# Patient Record
Sex: Female | Born: 1950 | Race: Black or African American | Hispanic: No | Marital: Married | Smoking: Never smoker
Health system: Southern US, Community
[De-identification: ages and names within clinical notes are randomized; demographics above are authoritative.]

## PROBLEM LIST (undated history)

## (undated) DIAGNOSIS — K602 Anal fissure, unspecified: Secondary | ICD-10-CM

## (undated) DIAGNOSIS — F419 Anxiety disorder, unspecified: Secondary | ICD-10-CM

## (undated) DIAGNOSIS — I10 Essential (primary) hypertension: Secondary | ICD-10-CM

## (undated) DIAGNOSIS — J189 Pneumonia, unspecified organism: Secondary | ICD-10-CM

## (undated) DIAGNOSIS — D649 Anemia, unspecified: Secondary | ICD-10-CM

## (undated) HISTORY — DX: Anxiety disorder, unspecified: F41.9

## (undated) HISTORY — PX: EYE SURGERY: SHX253

## (undated) HISTORY — DX: Pneumonia, unspecified organism: J18.9

## (undated) HISTORY — DX: Anemia, unspecified: D64.9

## (undated) HISTORY — DX: Anal fissure, unspecified: K60.2

## (undated) HISTORY — PX: SINUS IRRIGATION: SHX2411

---
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
## 1984-09-07 HISTORY — PX: VAGINAL HYSTERECTOMY: SUR661

## 2003-03-10 ENCOUNTER — Emergency Department (HOSPITAL_COMMUNITY): Admission: EM | Admit: 2003-03-10 | Discharge: 2003-03-10 | Payer: Self-pay | Admitting: Emergency Medicine

## 2003-03-10 ENCOUNTER — Encounter: Payer: Self-pay | Admitting: Emergency Medicine

## 2004-09-07 DIAGNOSIS — J189 Pneumonia, unspecified organism: Secondary | ICD-10-CM

## 2004-09-07 HISTORY — DX: Pneumonia, unspecified organism: J18.9

## 2008-10-19 ENCOUNTER — Ambulatory Visit: Payer: Self-pay | Admitting: Gastroenterology

## 2008-10-24 ENCOUNTER — Ambulatory Visit: Payer: Self-pay | Admitting: Cardiology

## 2008-10-26 ENCOUNTER — Ambulatory Visit: Payer: Self-pay | Admitting: Gastroenterology

## 2008-11-02 ENCOUNTER — Ambulatory Visit: Payer: Self-pay

## 2008-11-02 ENCOUNTER — Encounter: Payer: Self-pay | Admitting: Cardiology

## 2008-11-23 ENCOUNTER — Encounter: Payer: Self-pay | Admitting: Cardiology

## 2008-11-23 ENCOUNTER — Ambulatory Visit: Payer: Self-pay | Admitting: Cardiology

## 2010-10-06 ENCOUNTER — Other Ambulatory Visit: Payer: Self-pay | Admitting: Orthopaedic Surgery

## 2010-10-06 DIAGNOSIS — M542 Cervicalgia: Secondary | ICD-10-CM

## 2010-10-06 DIAGNOSIS — M5410 Radiculopathy, site unspecified: Secondary | ICD-10-CM

## 2010-10-08 ENCOUNTER — Ambulatory Visit
Admission: RE | Admit: 2010-10-08 | Discharge: 2010-10-08 | Disposition: A | Discharge status: Home / Self Care | Payer: BC Managed Care – PPO | Source: Ambulatory Visit | Attending: Orthopaedic Surgery | Admitting: Orthopaedic Surgery

## 2010-10-08 DIAGNOSIS — M542 Cervicalgia: Secondary | ICD-10-CM

## 2010-10-08 DIAGNOSIS — M5410 Radiculopathy, site unspecified: Secondary | ICD-10-CM

## 2011-01-20 NOTE — Assessment & Plan Note (Signed)
[facility]                            CARDIOLOGY OFFICE NOTE   PAVIELLE, BIGGAR                   MRN:          [id number]  DATE:11/23/2008                            DOB:          [birth year]    Ms. Michelle Gill is seen for followup.  See my consult note dated  October 24, 2008.  At that time, she had some chest discomfort.  She  did not appear to be unstable.  We decided to obtain a stress echo.  The  study was done.  She exercised adequately.  There was no EKG change.  Stress images were normal.  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   Today in the office, she feels well.  She has not had any further  discomfort.  I had a full discussion with the patient and her husband  about her symptoms.   PAST MEDICAL HISTORY:   ALLERGIES:  NAPROXEN.   MEDICATIONS:  Hydrochlorothiazide, vitamin D, Nexium and Claritin.   OTHER MEDICAL PROBLEMS:  See the problem list on my note of October 24, 2008.   REVIEW OF SYSTEMS:  She is feeling well today.  Palpitations have  resolved.  There is no significant chest pain.  There is no fevers or  chills.  There are no skin rashes.  She has no major musculoskeletal  complaints.  There are no GI or GU symptoms.  Her review of systems was  negative.   PHYSICAL EXAMINATION:  VITAL SIGNS:  Blood pressure is 102/70.  Pulse  was 65.  GENERAL:  The patient was oriented to person, time, and place.  Affect  is normal.  HEENT:  No xanthelasma.  She has normal extraocular motion.  NECK:  There are no carotid bruits.  There is no jugular venous  distention.  LUNGS:  Clear.  Respiratory effort is not labored.  CARDIAC:  S1 with an S2.  There are no clicks or significant murmurs.   I had a full discussion with the patient and her husband about the  findings.  Her cardiac status is stable.   Problems are listed  #2 through 10 on my note of October 24, 2008.  #9.  Palpitations, improved.  #10.  Some chest discomfort.  I believe this is not cardiac in origin.  #11.  Very mild mitral regurgitation on echo.  #12.  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  This is a  clinically insignificant finding.  Some cardiologist use aspirin in this  setting.  Therefore, I have encouraged the patient that unless she  has a contraindication to aspirin that she start to use 81 mg daily.  No  further cardiac workup as needed.     Luis Abed, MD, [facility]  Electronically Signed    JDK/MedQ  DD: 11/23/2008  DT: 11/24/2008  Job #: [id number]   cc:   Duncan Dull, M.D.

## 2011-01-20 NOTE — Assessment & Plan Note (Signed)
[facility]                            CARDIOLOGY OFFICE NOTE   Michelle, Gill                   MRN:          [id number]  DATE:10/24/2008                            DOB:          October 06, 1950    Michelle Gill is a very pleasant 60 year old female.  She has no  documented coronary artery disease.  She has had some mild palpitations.  She has not had any syncope or presyncope.  She has also had some mild  chest discomfort.  The chest discomfort appear usually occurs at night.  It is not with exercise.  It can last in the range of 1 hour.  She does  not have any radiation.  There is no nausea, vomiting, or diaphoresis.  There is a history of hypertension that is mild.  There is no diabetes.  The patient does not smoke.   PAST MEDICAL HISTORY:   ALLERGIES:  NAPROXEN and MORPHINE.   MEDICATIONS:  Hydrochlorothiazide and Lexapro.   OTHER MEDICAL PROBLEMS:  See the list below.   SOCIAL HISTORY:  The patient is married.  At the current time, she is  not working.  She does not smoke.   FAMILY HISTORY:  There is no strong family history of coronary disease.   REVIEW OF SYSTEMS:  She has some seasonal allergies.  She has some mild  constipation and GERD.  She has had some mild anxiety that is being  treated with Lexapro.  She has no shortness of breath.  There are no GU  problems.  There is no fevers, chills, or skin rashes.  Otherwise, her  review of systems is negative.   PHYSICAL EXAMINATION:  VITAL SIGNS:  Blood pressure is 100/68 with a  pulse of 75.  Weight is 122.  GENERAL:  The patient is here with her husband today.  The patient is  oriented to person, time, and place.  Affect is normal.  HEENT:  No xanthelasma.  She has normal extraocular motion.  NECK:  There are no carotid bruits.  There is no jugular venous  distention.  LUNGS:  Clear.  Respiratory effort is not labored.  CARDIAC:  An S1 with an S2.  There are no clicks or  significant murmurs.  ABDOMEN:  Soft.  EXTREMITIES:  There is no peripheral edema.   The information, I received from Dr. Kevan Ny, I am aware that the patient  has an HDL of 90 with triglycerides of 42 and LDL of 121.   EKG today.  It shows normal sinus rhythm.  There is question of a left  atrial abnormality.  We will assess her atrium further at the time of  her 2-D echo.   PROBLEMS:  #1.  History of headaches over time.  #2.  Allergies to NAPROXEN and MORPHINE.  3#.  Status post hysterectomy.  #4.  History of gastroesophageal reflux disease.  #5.  History of some anxiety and depression being treated.  #6.  History of hypertension with her blood pressure on the lower side  at this time.  #7.  Low vitamin D level that is being treated.  #8.  High HDL of 90 and an LDL of 121.  I certainly agree with Dr. Kevan Ny  that this can be followed over time and that no definite treatment is  needed at this point.  Of course, it would be nice to have her LDL of  100 or lower.  #9.  Mild palpitations.  This is an old problem.  I think it is very  unlikely that she has any significant arrhythmias.  No plans are made  for any type of monitoring at this time.  #10.  Chest discomfort.  At this point, I am not convinced of  significant ischemia.  However, further information will be helpful.  We  will obtain an echocardiogram with a stress echocardiogram.  This will  give me information about left ventricular function and valvular  function.  We will also answer the question as to whether or not there  is a left atrial abnormality of any kind.  This will be scheduled in the  near future.  The patient will walk on a treadmill and have a full  echocardiogram and stress echocardiogram.  I will then see her back to  fully discuss the results.  In the meantime, she is to go about full  activities.     Luis Abed, MD, [facility]  Electronically Signed    JDK/MedQ  DD: 10/24/2008  DT: 10/24/2008  Job  #: [id number]   cc:   Duncan Dull, M.D.

## 2011-04-30 ENCOUNTER — Other Ambulatory Visit: Payer: Self-pay | Admitting: Family Medicine

## 2011-05-08 ENCOUNTER — Ambulatory Visit
Admission: RE | Admit: 2011-05-08 | Discharge: 2011-05-08 | Disposition: A | Discharge status: Home / Self Care | Payer: BC Managed Care – PPO | Source: Ambulatory Visit | Attending: Family Medicine | Admitting: Family Medicine

## 2011-08-20 ENCOUNTER — Ambulatory Visit: Payer: BC Managed Care – PPO | Admitting: Family Medicine

## 2011-12-04 ENCOUNTER — Emergency Department (HOSPITAL_COMMUNITY)
Admission: EM | Admit: 2011-12-04 | Discharge: 2011-12-04 | Disposition: A | Discharge status: Home / Self Care | Payer: BC Managed Care – PPO | Attending: Emergency Medicine | Admitting: Emergency Medicine

## 2011-12-04 ENCOUNTER — Emergency Department (HOSPITAL_COMMUNITY): Payer: BC Managed Care – PPO

## 2011-12-04 ENCOUNTER — Encounter (HOSPITAL_COMMUNITY): Payer: Self-pay | Admitting: *Deleted

## 2011-12-04 DIAGNOSIS — J3489 Other specified disorders of nose and nasal sinuses: Secondary | ICD-10-CM | POA: Insufficient documentation

## 2011-12-04 DIAGNOSIS — J45909 Unspecified asthma, uncomplicated: Secondary | ICD-10-CM | POA: Insufficient documentation

## 2011-12-04 DIAGNOSIS — R6889 Other general symptoms and signs: Secondary | ICD-10-CM | POA: Insufficient documentation

## 2011-12-04 DIAGNOSIS — R059 Cough, unspecified: Secondary | ICD-10-CM | POA: Insufficient documentation

## 2011-12-04 DIAGNOSIS — R05 Cough: Secondary | ICD-10-CM | POA: Insufficient documentation

## 2011-12-04 DIAGNOSIS — R070 Pain in throat: Secondary | ICD-10-CM | POA: Insufficient documentation

## 2011-12-04 DIAGNOSIS — I10 Essential (primary) hypertension: Secondary | ICD-10-CM | POA: Insufficient documentation

## 2011-12-04 DIAGNOSIS — R5381 Other malaise: Secondary | ICD-10-CM | POA: Insufficient documentation

## 2011-12-04 DIAGNOSIS — R110 Nausea: Secondary | ICD-10-CM | POA: Insufficient documentation

## 2011-12-04 DIAGNOSIS — IMO0001 Reserved for inherently not codable concepts without codable children: Secondary | ICD-10-CM | POA: Insufficient documentation

## 2011-12-04 DIAGNOSIS — Z79899 Other long term (current) drug therapy: Secondary | ICD-10-CM | POA: Insufficient documentation

## 2011-12-04 DIAGNOSIS — R509 Fever, unspecified: Secondary | ICD-10-CM | POA: Insufficient documentation

## 2011-12-04 HISTORY — DX: Essential (primary) hypertension: I10

## 2011-12-04 LAB — CBC
HCT: 39.6 % (ref 36.0–46.0)
MCHC: 32.6 g/dL (ref 30.0–36.0)
Platelets: 132 10*3/uL — ABNORMAL LOW (ref 150–400)
RDW: 13.6 % (ref 11.5–15.5)

## 2011-12-04 LAB — BASIC METABOLIC PANEL
BUN: 6 mg/dL (ref 6–23)
Creatinine, Ser: 0.51 mg/dL (ref 0.50–1.10)
GFR calc Af Amer: >90 mL/min (ref >90)
GFR calc non Af Amer: >90 mL/min (ref >90)
Potassium: 3.2 mEq/L — ABNORMAL LOW (ref 3.5–5.1)

## 2011-12-04 MED ORDER — OSELTAMIVIR PHOSPHATE 75 MG PO CAPS
75.0000 mg | ORAL_CAPSULE | ORAL | Status: AC
  Administered 2011-12-04: 75 mg via ORAL
  Filled 2011-12-04: qty 1

## 2011-12-04 MED ORDER — OSELTAMIVIR PHOSPHATE 75 MG PO CAPS: 75.0000 mg | ORAL_CAPSULE | Freq: Two times a day (BID) | ORAL | Status: AC

## 2011-12-04 MED ORDER — HYDROCODONE-ACETAMINOPHEN 5-500 MG PO TABS: 1.0000 | ORAL_TABLET | Freq: Four times a day (QID) | ORAL | Status: AC | PRN

## 2011-12-04 MED ORDER — ONDANSETRON HCL 4 MG/2ML IJ SOLN
4.0000 mg | Freq: Once | INTRAMUSCULAR | Status: AC
  Administered 2011-12-04: 4 mg via INTRAVENOUS
  Filled 2011-12-04: qty 2

## 2011-12-04 MED ORDER — SODIUM CHLORIDE 0.9 % IV BOLUS (SEPSIS)
1000.0000 mL | Freq: Once | INTRAVENOUS | Status: AC
  Administered 2011-12-04: 1000 mL via INTRAVENOUS

## 2011-12-04 NOTE — ED Notes (Signed)
Pt has not felt well for 2 weeks.  Pt has been feeling much worse since yesterday, pt has had cough, congestion and fever since yesterday.  Pt has also been having nausea every time she eats or drinks

## 2011-12-04 NOTE — ED Provider Notes (Signed)
History     CSN: [id number]  Arrival date & time 12/04/11  1649   First MD Initiated Contact with Patient 12/04/11 1859      Chief Complaint  Patient presents with  . Influenza    (Consider location/radiation/quality/duration/timing/severity/associated sxs/prior treatment) Patient is a 61 y.o. female presenting with URI. The history is provided by the patient and the spouse.  URI The primary symptoms include fever, fatigue, sore throat, cough, nausea and myalgias. Primary symptoms do not include headaches, wheezing, abdominal pain or vomiting. The current episode started yesterday. This is a new problem. The problem has not changed since onset. The fever began yesterday. The maximum temperature recorded prior to her arrival was 100 to 100.9 F.  The cough is non-productive.  The onset of the illness is associated with exposure to sick contacts. Symptoms associated with the illness include facial pain, sinus pressure and rhinorrhea. The illness is not associated with chills or congestion.    Past Medical History  Diagnosis Date  . Hypertension   . Asthma     Past Surgical History  Procedure Date  . Abdominal hysterectomy   . Sinus irrigation   . Eye surgery     No family history on file.  History  Substance Use Topics  . Smoking status: Not on file  . Smokeless tobacco: Not on file  . Alcohol Use: No    OB History    Grav Para Term Preterm Abortions TAB SAB Ect Mult Living                  Review of Systems  Constitutional: Positive for fever and fatigue. Negative for chills.  HENT: Positive for sore throat, rhinorrhea and sinus pressure. Negative for congestion.   Respiratory: Positive for cough. Negative for wheezing.   Cardiovascular: Negative for chest pain and leg swelling.  Gastrointestinal: Positive for nausea. Negative for vomiting, abdominal pain and constipation.  Genitourinary: Negative for urgency, decreased urine volume and difficulty urinating.    Musculoskeletal: Positive for myalgias.  Skin: Negative for wound.  Neurological: Negative for headaches.  Psychiatric/Behavioral: Negative for confusion.  All other systems reviewed and are negative.    Allergies  Naproxen  Home Medications   Current Outpatient Rx  Name Route Sig Dispense Refill  . ACETAMINOPHEN 500 MG PO TABS Oral Take 500 mg by mouth every 6 (six) hours as needed. For fever/pain    . AMOXICILLIN-POT CLAVULANATE 875-125 MG PO TABS Oral Take 1 tablet by mouth 2 (two) times daily.    Marland Kitchen CETIRIZINE-PSEUDOEPHEDRINE ER 5-120 MG PO TB12 Oral Take 1 tablet by mouth daily.    Marland Kitchen VITAMIN D 1000 UNITS PO TABS Oral Take 1,000 Units by mouth daily.    Marland Kitchen ESOMEPRAZOLE MAGNESIUM 20 MG PO CPDR Oral Take 20 mg by mouth daily before breakfast.    . FERROUS FUMARATE 325 (106 FE) MG PO TABS Oral Take 1 tablet by mouth daily.    Marland Kitchen HYDROCHLOROTHIAZIDE 25 MG PO TABS Oral Take 25 mg by mouth daily.    Marland Kitchen VITAMIN B-12 500 MCG PO TABS Oral Take 500 mcg by mouth daily.      BP 113/78  Pulse 118  Temp(Src) 99.2 F (37.3 C) (Oral)  Resp 18  SpO2 100%  Physical Exam  Nursing note and vitals reviewed. Constitutional: She is oriented to person, place, and time. She appears well-developed and well-nourished. No distress.  HENT:  Head: Normocephalic and atraumatic.  Right Ear: Tympanic membrane and external ear normal.  Left Ear: Tympanic membrane and external ear normal.  Nose: Nose normal.  Mouth/Throat: Oropharynx is clear and moist.  Neck: Neck supple.  Cardiovascular: Normal rate, regular rhythm, normal heart sounds and intact distal pulses.   Pulmonary/Chest: Effort normal and breath sounds normal. No respiratory distress. She has no wheezes. She has no rales.  Abdominal: Soft. She exhibits no distension. There is no tenderness.  Musculoskeletal: She exhibits no edema.  Lymphadenopathy:    She has no cervical adenopathy.  Neurological: She is alert and oriented to person, place,  and time.  Skin: Skin is warm and dry. She is not diaphoretic. No pallor.    ED Course  Procedures (including critical care time)  Labs Reviewed  CBC - Abnormal; Notable for the following:    RBC 5.38 (*)    MCV 73.6 (*)    MCH 24.0 (*)    Platelets 132 (*)    All other components within normal limits  BASIC METABOLIC PANEL - Abnormal; Notable for the following:    Potassium 3.2 (*)    Glucose, Bld 101 (*)    All other components within normal limits   Dg Chest 2 View  12/04/2011  *RADIOLOGY REPORT*  Clinical Data: Shortness of breath, fever, nausea.  CHEST - 2 VIEW  Comparison: None.  Findings: Heart and mediastinal contours are within normal limits. No focal opacities or effusions.  No acute bony abnormality.  IMPRESSION: No active cardiopulmonary disease.  Original Report Authenticated By: Cyndie Chime, M.D.     1. Flu-like symptoms       MDM  61 yo female with URI/flu-like symptoms since yesterday. Has felt nauseous and dehydrated, PCP sent over for hydration. CXR neg for PNA. Patient's sx c/w flu clinically. Given hydration and zofran, able to take PO in ED. Discussed risks/benefits/cost of tamiflu, and patient says she does want to be treated. Due to patient's age and early onset of symptoms as well as comorbidities (asthma, HTN), will treat with tamiflu. Patient looks well enough to be discharged, discussed return precautions and PCP follow up.        Pricilla Loveless, MD 12/04/11 [phone]

## 2011-12-04 NOTE — ED Provider Notes (Signed)
Complains of headache sore throat fever and cough since yesterday no shortness of breath no other complaint treated with amoxicillin since yesterday, no relief. On exam alert nontoxic HEENT exam oropharynx reddened no tonsillar exudate uvula midline ears bilateral tympanic membranes normal nose with congestion. Neck supple no cervical lymphadenopathy no signs of meningitis lungs clear to auscultation Suspect influenza  Doug Sou, MD 12/04/11 2136

## 2011-12-05 NOTE — ED Provider Notes (Signed)
I have personally seen and examined the patient.  I have discussed the plan of care with the resident.  I have reviewed the documentation on PMH/FH/Soc. History.  I have reviewed the documentation of the resident and agree.  Doug Sou, MD 12/05/11 [phone]

## 2012-01-13 ENCOUNTER — Encounter: Payer: BC Managed Care – PPO | Admitting: Physician Assistant

## 2012-06-01 NOTE — Progress Notes (Signed)
This encounter was created in error - please disregard.

## 2013-03-17 ENCOUNTER — Encounter: Payer: Self-pay | Admitting: Gastroenterology

## 2013-03-30 ENCOUNTER — Ambulatory Visit (INDEPENDENT_AMBULATORY_CARE_PROVIDER_SITE_OTHER): Payer: BC Managed Care – PPO | Admitting: Gastroenterology

## 2013-03-30 ENCOUNTER — Encounter: Payer: Self-pay | Admitting: Gastroenterology

## 2013-03-30 VITALS — BP 100/60 | HR 60 | Ht 64.0 in | Wt 121.8 lb

## 2013-03-30 DIAGNOSIS — R1310 Dysphagia, unspecified: Secondary | ICD-10-CM

## 2013-03-30 DIAGNOSIS — D509 Iron deficiency anemia, unspecified: Secondary | ICD-10-CM | POA: Insufficient documentation

## 2013-03-30 NOTE — Assessment & Plan Note (Addendum)
Long history of iron deficiency anemia without evidence of gross GI blood loss. malabsorption is a possibility as well as occult GI blood loss. Patient is intolerant to oral iron.  2010 colonoscopy negative.  Recommendations #1 stool Hemoccults #2 I will obtain small bowel biopsies at the time of upper endoscopy to rule out celiac disease.

## 2013-03-30 NOTE — Patient Instructions (Addendum)
You have been scheduled for an endoscopy with propofol. Please follow the written instructions given to you at your visit today. Please pick up your prep at the pharmacy within the next 1-3 days. If you use inhalers (even only as needed), please bring them with you on the day of your procedure. Your physician has requested that you go to www.startemmi.com and enter the access code given to you at your visit today. This web site gives a general overview about your procedure. However, you should still follow specific instructions given to you by our office regarding your preparation for the proced  Go to the basement today for your IFOB Kit

## 2013-03-30 NOTE — Assessment & Plan Note (Signed)
Two-year history of progressive dysphagia, negative barium swallow in 2012. I suspect that she has an esophageal stricture.  Recommendations #1 upper endoscopy with dilatation as indicated

## 2013-03-30 NOTE — Progress Notes (Signed)
History of Present Illness: Pleasant 62 year old Afro-American female referred at the request of Dr. Pattricia Boss for evaluation of dysphagia. This is been a progressive problem over the past couple of years. She has dysphagia solids and, to a lesser extent, liquids.  She underwent a barium swallowing 2012 for similar complaints. Exam was normal. She especially has difficulty swallowing pills. She complains of dysgeusia.  She has occasional pyrosis and takes Nexium.  Patient also has a long history of iron deficiency anemia. She underwent a hysterectomy many years ago. She takes occasional oral iron but does not tolerate this. There is no history of gross bleeding. She does complain of bloating. Symptoms improve slightly when she went on a gluten-free diet.  Colonoscopy in 2010 was normal.    Past Medical History  Diagnosis Date  . Hypertension   . Asthma     exercise induced  . Anal fissure   . Anemia   . Anxiety   . Pneumonia 2006   Past Surgical History  Procedure Laterality Date  . Vaginal hysterectomy  1986    partial  . Sinus irrigation    . Eye surgery Left     x 3 for lazy eye   family history includes Diabetes in her brother and sister.  There is no history of Colon cancer. Current Outpatient Prescriptions  Medication Sig Dispense Refill  . Biotin 2500 MCG CAPS Take 1 capsule by mouth daily.      . cetirizine-pseudoephedrine (ZYRTEC-D) 5-120 MG per tablet Take 1 tablet by mouth daily.      . cholecalciferol (VITAMIN D) 1000 UNITS tablet Take 2,000 Units by mouth daily.       Marland Kitchen esomeprazole (NEXIUM) 40 MG capsule Take 40 mg by mouth as needed.      . ferrous sulfate 324 (65 FE) MG TBEC Take 1 tablet by mouth daily.      . hydrochlorothiazide (HYDRODIURIL) 25 MG tablet Take 25 mg by mouth daily.      . montelukast (SINGULAIR) 10 MG tablet Take 10 mg by mouth as needed.       No current facility-administered medications for this visit.   Allergies as of 03/30/2013 - Review  Complete 03/30/2013  Allergen Reaction Noted  . Morphine and related  03/30/2013  . Naproxen Swelling     reports that she has never smoked. She has never used smokeless tobacco. She reports that she does not drink alcohol or use illicit drugs.     Review of Systems: Pertinent positive and negative review of systems were noted in the above HPI section. All other review of systems were otherwise negative.  Vital signs were reviewed in today's medical record Physical Exam: General: Well developed , well nourished, no acute distress Skin: anicteric Head: Normocephalic and atraumatic Eyes:  sclerae anicteric, EOMI Ears: Normal auditory acuity Mouth: No deformity or lesions Neck: Supple, no masses or thyromegaly Lungs: Clear throughout to auscultation Heart: Regular rate and rhythm; no murmurs, rubs or bruits Abdomen: Soft, non tender and non distended. No masses, hepatosplenomegaly or hernias noted. Normal Bowel sounds Rectal:deferred Musculoskeletal: Symmetrical with no gross deformities  Skin: No lesions on visible extremities Pulses:  Normal pulses noted Extremities: No clubbing, cyanosis, edema or deformities noted Neurological: Alert oriented x 4, grossly nonfocal Cervical Nodes:  No significant cervical adenopathy Inguinal Nodes: No significant inguinal adenopathy Psychological:  Alert and cooperative. Normal mood and affect

## 2013-04-20 ENCOUNTER — Encounter: Payer: Self-pay | Admitting: Gastroenterology

## 2013-04-20 ENCOUNTER — Ambulatory Visit (AMBULATORY_SURGERY_CENTER): Payer: BC Managed Care – PPO | Admitting: Gastroenterology

## 2013-04-20 VITALS — BP 126/74 | HR 70 | Temp 98.9°F | Resp 16 | Ht 64.0 in | Wt 121.0 lb

## 2013-04-20 DIAGNOSIS — R1310 Dysphagia, unspecified: Secondary | ICD-10-CM

## 2013-04-20 DIAGNOSIS — D509 Iron deficiency anemia, unspecified: Secondary | ICD-10-CM

## 2013-04-20 DIAGNOSIS — D1330 Benign neoplasm of unspecified part of small intestine: Secondary | ICD-10-CM

## 2013-04-20 MED ORDER — SODIUM CHLORIDE 0.9 % IV SOLN: 500.0000 mL | INTRAVENOUS | Status: DC

## 2013-04-20 NOTE — Progress Notes (Signed)
Patient did not experience any of the following events: a burn prior to discharge; a fall within the facility; wrong site/side/patient/procedure/implant event; or a hospital transfer or hospital admission upon discharge from the facility. (G8907) Patient did not have preoperative order for IV antibiotic SSI prophylaxis. (G8918)  

## 2013-04-20 NOTE — Op Note (Signed)
[facility] [address] [place], [address]   ENDOSCOPY PROCEDURE REPORT  PATIENT: Michelle, Gill  MR#: [id number] BIRTHDATE: 01/26/1951 , 62  yrs. old GENDER: Female ENDOSCOPIST: Louis Meckel, MD REFERRED BY:  Excell Seltzer, M.D. PROCEDURE DATE:  04/20/2013 PROCEDURE:  EGD w/ biopsy and Maloney dilation of esophagus ASA CLASS:     Class II INDICATIONS:  Dysphagia.   Iron deficiency anemia. MEDICATIONS: MAC sedation, administered by CRNA and Propofol (Diprivan) 130 mg IV TOPICAL ANESTHETIC:  DESCRIPTION OF PROCEDURE: After the risks benefits and alternatives of the procedure were thoroughly explained, informed consent was obtained.  The LB [id number] endoscope was introduced through the mouth and advanced to the third portion of the duodenum. Without limitations.  The instrument was slowly withdrawn as the mucosa was fully examined.      The upper, middle and distal third of the esophagus were carefully inspected and no abnormalities were noted.  The z-line was well seen at the GEJ.  The endoscope was pushed into the fundus which was normal including a retroflexed view.  The antrum, gastric body, first and second part of the duodenum were unremarkable. The scope was then withdrawn from the patient and the procedure completed.  biopsies were taken in the second portion of the duodenum and the duodenal bulb to rule out sprue.  Because of complaints of dysphagia although no frank stricture was seen, a #52 [place] dilator was passed.  There was mild to moderate resistance.  There was trace heme  COMPLICATIONS: There were no complications. ENDOSCOPIC IMPRESSION: probable early esophageal stricture-status post [facility] dilation  RECOMMENDATIONS: Await biopsy results OV 1 month REPEAT EXAM:  eSigned:  Louis Meckel, MD 04/20/2013 3:56 PM   CC:

## 2013-04-20 NOTE — Progress Notes (Signed)
Called to room to assist during endoscopic procedure.  Patient ID and intended procedure confirmed with present staff. Received instructions for my participation in the procedure from the performing physician. ewm 

## 2013-04-20 NOTE — Patient Instructions (Addendum)
YOU HAD AN ENDOSCOPIC PROCEDURE TODAY AT THE [facility]: Refer to the procedure report that was given to you for any specific questions about what was found during the examination.  If the procedure report does not answer your questions, please call your gastroenterologist to clarify.  If you requested that your care partner not be given the details of your procedure findings, then the procedure report has been included in a sealed envelope for you to review at your convenience later.  YOU SHOULD EXPECT: Some feelings of bloating in the abdomen. Passage of more gas than usual.  Walking can help get rid of the air that was put into your GI tract during the procedure and reduce the bloating.  DIET: FOLLOW DILATION DIET- SEE HANDOUT Drink plenty of fluids but you should avoid alcoholic beverages for 24 hours.  ACTIVITY: Your care partner should take you home directly after the procedure.  You should plan to take it easy, moving slowly for the rest of the day.  You can resume normal activity the day after the procedure however you should NOT DRIVE or use heavy machinery for 24 hours (because of the sedation medicines used during the test).    SYMPTOMS TO REPORT IMMEDIATELY: A gastroenterologist can be reached at any hour.  During normal business hours, 8:30 AM to 5:00 PM Monday through Friday, call [phone].  After hours and on weekends, please call the GI answering service at [phone] who will take a message and have the physician on call contact you.   Following upper endoscopy (EGD)  Vomiting of blood or coffee ground material  New chest pain or pain under the shoulder blades  Painful or persistently difficult swallowing  New shortness of breath  Fever of 100F or higher  Black, tarry-looking stools  FOLLOW UP: If any biopsies were taken you will be contacted by phone or by letter within the next 1-3 weeks.  Call your gastroenterologist if you have not heard about the  biopsies in 3 weeks.  Our staff will call the home number listed on your records the next business day following your procedure to check on you and address any questions or concerns that you may have at that time regarding the information given to you following your procedure. This is a courtesy call and so if there is no answer at the home number and we have not heard from you through the emergency physician on call, we will assume that you have returned to your regular daily activities without incident.  SIGNATURES/CONFIDENTIALITY: You and/or your care partner have signed paperwork which will be entered into your electronic medical record.  These signatures attest to the fact that that the information above on your After Visit Summary has been reviewed and is understood.  Full responsibility of the confidentiality of this discharge information lies with you and/or your care-partner.  Follow dilation diet today  Continue your normal medications  Office visit in 1 month- if you have not heard from office nurse by Monday, please call the office  Await biopsy results

## 2013-04-20 NOTE — Progress Notes (Signed)
  [facility] Anesthesia Post-op Note  Patient: Michelle Gill  Procedure(s) Performed: endoscopy with dilatation  Patient Location: [facility] - Recovery Area  Anesthesia Type: Deep Sedation/Propofol  Level of Consciousness: awake, oriented and patient cooperative  Airway and Oxygen Therapy: Patient Spontanous Breathing  Post-op Pain: none  Post-op Assessment:  Post-op Vital signs reviewed, Patient's Cardiovascular Status Stable, Respiratory Function Stable, Patent Airway, No signs of Nausea or vomiting and Pain level controlled  Post-op Vital Signs: Reviewed and stable  Complications: No apparent anesthesia complications  Raechell Singleton E 4:04 PM

## 2013-04-21 ENCOUNTER — Telehealth: Payer: Self-pay

## 2013-04-21 NOTE — Telephone Encounter (Signed)
Left a message at # [phone] for the pt to call if she has any questions or concerns. Maw

## 2013-04-25 ENCOUNTER — Ambulatory Visit (INDEPENDENT_AMBULATORY_CARE_PROVIDER_SITE_OTHER): Payer: BC Managed Care – PPO | Admitting: Family Medicine

## 2013-04-25 ENCOUNTER — Encounter: Payer: Self-pay | Admitting: Family Medicine

## 2013-04-25 VITALS — BP 110/70 | HR 93 | Temp 98.3°F | Ht 64.0 in | Wt 121.0 lb

## 2013-04-25 DIAGNOSIS — I10 Essential (primary) hypertension: Secondary | ICD-10-CM

## 2013-04-25 DIAGNOSIS — R1310 Dysphagia, unspecified: Secondary | ICD-10-CM

## 2013-04-25 DIAGNOSIS — M5010 Cervical disc disorder with radiculopathy, unspecified cervical region: Secondary | ICD-10-CM

## 2013-04-25 DIAGNOSIS — J302 Other seasonal allergic rhinitis: Secondary | ICD-10-CM

## 2013-04-25 DIAGNOSIS — L568 Other specified acute skin changes due to ultraviolet radiation: Secondary | ICD-10-CM

## 2013-04-25 DIAGNOSIS — M810 Age-related osteoporosis without current pathological fracture: Secondary | ICD-10-CM | POA: Insufficient documentation

## 2013-04-25 DIAGNOSIS — J45990 Exercise induced bronchospasm: Secondary | ICD-10-CM | POA: Insufficient documentation

## 2013-04-25 DIAGNOSIS — D509 Iron deficiency anemia, unspecified: Secondary | ICD-10-CM

## 2013-04-25 DIAGNOSIS — J309 Allergic rhinitis, unspecified: Secondary | ICD-10-CM | POA: Insufficient documentation

## 2013-04-25 DIAGNOSIS — M5412 Radiculopathy, cervical region: Secondary | ICD-10-CM

## 2013-04-25 DIAGNOSIS — M1990 Unspecified osteoarthritis, unspecified site: Secondary | ICD-10-CM

## 2013-04-25 NOTE — Assessment & Plan Note (Signed)
Well controlled on current dose, given SE will try lower dose. Follow Bps at home.

## 2013-04-25 NOTE — Assessment & Plan Note (Signed)
Improved s/p dilation.  

## 2013-04-25 NOTE — Assessment & Plan Note (Signed)
Hg 13.1 at last check on iron sulfate.

## 2013-04-25 NOTE — Assessment & Plan Note (Signed)
Side effects to fosamax  Now followed by Dr. Renae Fickle... Looking into forteo as an option to treat. She is to contact them to find out what med she may tolerate best.

## 2013-04-25 NOTE — Patient Instructions (Addendum)
Try lower dose of HCTZ 12.5 mg. Follow BP at home, goal <140/90.  Schedule CPX with fasting labs prior.

## 2013-04-25 NOTE — Assessment & Plan Note (Signed)
Try lower dose of HCTZ.. To see if it helps with sensitivity to light.

## 2013-04-25 NOTE — Progress Notes (Signed)
Subjective:    Patient ID: Michelle Gill, female    DOB: Jul 14, 1951, 62 y.o.   MRN: [id number]  HPI  69 ye controlled. ar old female pt ( wife of another of my pt's) presents to establish care here.   She had previously see Dr. Kevan Ny at [facility].  Seeing Dr. Renae Fickle for osteoarthritis and osteoporosis.  Followed by Dr. Arlyce Dice for GERD, dysphagia... Cannot take pills. EGD last week showed some stricture... Had dilation. Also being evaluated for bloating..celiac disease.  HTN: Has had to start back on HCTZ since she has elevated BP since under stress in last year. Well controlled on this med but she believes it may be causing hypersensitivity to sun, her skin feels like it burning when in sun. After looking into chlorthalidone has this as SE listed as well.  She has been under a lot of stress form helping to care for her mother.  She denies depression and anxiety.   She almost  due for CPX.Marland Kitchen Last 07/2012  mammogram 03/2013 nml Colonoscopy 2010, Dr. Arlyce Dice, plans repeat in 2020.  Last DEXA: 2012 osteoporosis   Review of Systems  Constitutional: Negative for fever, fatigue and unexpected weight change.  HENT: Negative for ear pain, congestion, sore throat, sneezing, trouble swallowing and sinus pressure.   Eyes: Negative for pain and itching.  Respiratory: Negative for cough, shortness of breath and wheezing.   Cardiovascular: Negative for chest pain, palpitations and leg swelling.  Gastrointestinal: Positive for abdominal distention. Negative for nausea, abdominal pain, diarrhea, constipation and blood in stool.  Genitourinary: Negative for dysuria, hematuria, vaginal discharge, difficulty urinating and menstrual problem.  Skin: Negative for rash.  Neurological: Negative for syncope, weakness, light-headedness, numbness and headaches.  Psychiatric/Behavioral: Negative for suicidal ideas, confusion, sleep disturbance and dysphoric mood. The patient is not nervous/anxious.         Objective:   Physical Exam  Constitutional: Vital signs are normal. She appears well-developed and well-nourished. She is cooperative.  Non-toxic appearance. She does not appear ill. No distress.  HENT:  Head: Normocephalic.  Right Ear: Hearing, tympanic membrane, external ear and ear canal normal.  Left Ear: Hearing, tympanic membrane, external ear and ear canal normal.  Nose: Nose normal.  Eyes: Conjunctivae, EOM and lids are normal. Pupils are equal, round, and reactive to light. Lids are everted and swept, no foreign bodies found.  Neck: Trachea normal and normal range of motion. Neck supple. Carotid bruit is not present. No mass and no thyromegaly present.  Cardiovascular: Normal rate, regular rhythm, S1 normal, S2 normal, normal heart sounds and intact distal pulses.  Exam reveals no gallop.   No murmur heard. Pulmonary/Chest: Effort normal and breath sounds normal. No respiratory distress. She has no wheezes. She has no rhonchi. She has no rales.  Abdominal: Soft. Normal appearance. She exhibits no distension, no fluid wave, no abdominal bruit and no mass. Bowel sounds are increased. There is no hepatosplenomegaly. There is no tenderness. There is no rebound, no guarding and no CVA tenderness. No hernia.  Musculoskeletal:  Deformity, swelling and tenderness in 4th an 5th DIP joints.  Lymphadenopathy:    She has no cervical adenopathy.    She has no axillary adenopathy.  Neurological: She is alert. She has normal strength. No cranial nerve deficit or sensory deficit.  Skin: Skin is warm, dry and intact. No rash noted.  Psychiatric: Her speech is normal and behavior is normal. Judgment normal. Her mood appears not anxious. Cognition and memory are normal. She does  not exhibit a depressed mood.          Assessment & Plan:

## 2013-04-25 NOTE — Assessment & Plan Note (Addendum)
Initially started after starting fosamax.. Now only in right fingers 4 and 5th.  Neg RA and uric acid Followed by Dr. Renae Fickle... Treated with prednisone... On this right now.

## 2013-05-04 ENCOUNTER — Encounter: Payer: Self-pay | Admitting: Gastroenterology

## 2013-05-15 ENCOUNTER — Other Ambulatory Visit: Payer: Self-pay

## 2013-05-15 NOTE — Telephone Encounter (Signed)
Pt left v/m; pt saw Dr Ermalene Searing 04/25/13 for first time and pt request Dr Ermalene Searing to fill Singulair from her previous physician to [facility].Please advise. Pt request cb if refilled.

## 2013-05-16 MED ORDER — MONTELUKAST SODIUM 10 MG PO TABS: 10.0000 mg | ORAL_TABLET | ORAL | Status: DC | PRN

## 2013-05-23 ENCOUNTER — Ambulatory Visit: Payer: BC Managed Care – PPO | Admitting: Gastroenterology

## 2013-05-24 ENCOUNTER — Other Ambulatory Visit (INDEPENDENT_AMBULATORY_CARE_PROVIDER_SITE_OTHER): Payer: BC Managed Care – PPO

## 2013-05-24 ENCOUNTER — Ambulatory Visit: Payer: BC Managed Care – PPO

## 2013-05-24 ENCOUNTER — Telehealth: Payer: Self-pay | Admitting: Family Medicine

## 2013-05-24 DIAGNOSIS — M810 Age-related osteoporosis without current pathological fracture: Secondary | ICD-10-CM

## 2013-05-24 DIAGNOSIS — Z13220 Encounter for screening for lipoid disorders: Secondary | ICD-10-CM

## 2013-05-24 DIAGNOSIS — D509 Iron deficiency anemia, unspecified: Secondary | ICD-10-CM

## 2013-05-24 LAB — LDL CHOLESTEROL, DIRECT: Direct LDL: 116 mg/dL

## 2013-05-24 LAB — IBC PANEL: Iron: 134 ug/dL (ref 42–145)

## 2013-05-24 LAB — COMPREHENSIVE METABOLIC PANEL
ALT: 18 U/L (ref 0–35)
AST: 25 U/L (ref 0–37)
Calcium: 9.8 mg/dL (ref 8.4–10.5)
Chloride: 106 mEq/L (ref 96–112)
Creatinine, Ser: 0.7 mg/dL (ref 0.4–1.2)
Sodium: 140 mEq/L (ref 135–145)
Total Protein: 6.9 g/dL (ref 6.0–8.3)

## 2013-05-24 LAB — LIPID PANEL
HDL: 109.6 mg/dL (ref >39.00)
VLDL: 8.2 mg/dL (ref 0.0–40.0)

## 2013-05-24 NOTE — Telephone Encounter (Signed)
Message copied by Excell Seltzer on Wed May 24, 2013  8:19 AM ------      Message from: Alvina Chou      Created: Thu May 18, 2013 11:19 AM      Regarding: Lab orders for Wednesday, 9.17.14       Patient is scheduled for CPX labs, please order future labs, Thanks , Terri       ------

## 2013-05-25 LAB — VITAMIN D 25 HYDROXY (VIT D DEFICIENCY, FRACTURES): Vit D, 25-Hydroxy: 73 ng/mL (ref 30–89)

## 2013-05-30 ENCOUNTER — Ambulatory Visit (INDEPENDENT_AMBULATORY_CARE_PROVIDER_SITE_OTHER): Payer: BC Managed Care – PPO | Admitting: Family Medicine

## 2013-05-30 ENCOUNTER — Encounter: Payer: Self-pay | Admitting: Family Medicine

## 2013-05-30 VITALS — BP 110/70 | HR 71 | Temp 98.4°F | Ht 63.75 in | Wt 122.5 lb

## 2013-05-30 DIAGNOSIS — Z23 Encounter for immunization: Secondary | ICD-10-CM

## 2013-05-30 DIAGNOSIS — Z0000 Encounter for general adult medical examination without abnormal findings: Secondary | ICD-10-CM

## 2013-05-30 NOTE — Patient Instructions (Signed)
Can use olive oil or coconut oil for vaginal dryness. Let me know if not helping.

## 2013-05-30 NOTE — Progress Notes (Signed)
Subjective:    Patient ID: Michelle Gill, female    DOB: [birth year], 62 y.o.   MRN: [id number]  HPI The patient is here for annual wellness exam and preventative care.    She had previously see Dr. Kevan Ny at [facility].   Hypertension:  Well controlled on HCTZ. Non improvement in sunsensitivity on lower dose Using medication without problems or lightheadedness: None Chest pain with exertion:None Edema:None Short of breath:None Average home BPs: not checking. Other issues:  She has been under a lot of stress form helping to care for her mother.  She denies depression and anxiety.   Reviewed labs in detail.   Lab Results  Component Value Date   CHOL 239* 05/24/2013   HDL 109.60 05/24/2013   LDLDIRECT 116.0 05/24/2013   TRIG 41.0 05/24/2013   CHOLHDL 2 05/24/2013         Review of Systems  Constitutional: Negative for fever and fatigue.  HENT: Negative for ear pain.   Eyes: Negative for pain.  Respiratory: Negative for chest tightness and shortness of breath.   Cardiovascular: Negative for chest pain, palpitations and leg swelling.  Gastrointestinal: Negative for abdominal pain.  Genitourinary: Positive for vaginal bleeding and vaginal pain. Negative for dysuria and pelvic pain.       Symptoms with vaginal dryness after sex.       Objective:   Physical Exam  Constitutional: Vital signs are normal. She appears well-developed and well-nourished. She is cooperative.  Non-toxic appearance. She does not appear ill. No distress.  HENT:  Head: Normocephalic.  Right Ear: Hearing, tympanic membrane, external ear and ear canal normal.  Left Ear: Hearing, tympanic membrane, external ear and ear canal normal.  Nose: Nose normal.  Eyes: Conjunctivae, EOM and lids are normal. Pupils are equal, round, and reactive to light. Lids are everted and swept, no foreign bodies found.  Neck: Trachea normal and normal range of motion. Neck supple. Carotid bruit is not present. No mass and no  thyromegaly present.  Cardiovascular: Normal rate, regular rhythm, S1 normal, S2 normal, normal heart sounds and intact distal pulses.  Exam reveals no gallop.   No murmur heard. Pulmonary/Chest: Effort normal and breath sounds normal. No respiratory distress. She has no wheezes. She has no rhonchi. She has no rales.  Abdominal: Soft. Normal appearance and bowel sounds are normal. She exhibits no distension, no fluid wave, no abdominal bruit and no mass. There is no hepatosplenomegaly. There is no tenderness. There is no rebound, no guarding and no CVA tenderness. No hernia.  Genitourinary: No breast swelling, tenderness, discharge or bleeding. Pelvic exam was performed with patient supine.  Lymphadenopathy:    She has no cervical adenopathy.    She has no axillary adenopathy.  Neurological: She is alert. She has normal strength. No cranial nerve deficit or sensory deficit.  Skin: Skin is warm, dry and intact. No rash noted.  Psychiatric: Her speech is normal and behavior is normal. Judgment normal. Her mood appears not anxious. Cognition and memory are normal. She does not exhibit a depressed mood.          Assessment & Plan:  The patient's preventative maintenance and recommended screening tests for an annual wellness exam were reviewed in full today. Brought up to date unless services declined.  Counselled on the importance of diet, exercise, and its role in overall health and mortality. The patient's FH and SH was reviewed, including their home life, tobacco status, and drug and alcohol status.    Mammogram  03/2013 nml  Colonoscopy 2010, Dr. Arlyce Dice, plans repeat in 2020.  Last DEXA: 2012 osteoporosis... Followed by Dr. Renae Fickle. PAP/DVE: no pap indicated, no new abdominal symptoms, no ovarian cancer in family. Refuses DVE. Vaccines:uptodate with PNA, Tdap, zoster, given flu vaccine today

## 2013-06-12 ENCOUNTER — Other Ambulatory Visit: Payer: Self-pay | Admitting: Family Medicine

## 2013-06-19 ENCOUNTER — Ambulatory Visit (INDEPENDENT_AMBULATORY_CARE_PROVIDER_SITE_OTHER): Payer: BC Managed Care – PPO | Admitting: Gastroenterology

## 2013-06-19 ENCOUNTER — Encounter: Payer: Self-pay | Admitting: Gastroenterology

## 2013-06-19 VITALS — BP 100/60 | HR 82 | Ht 63.75 in | Wt 122.0 lb

## 2013-06-19 DIAGNOSIS — R1310 Dysphagia, unspecified: Secondary | ICD-10-CM

## 2013-06-19 NOTE — Assessment & Plan Note (Signed)
Resolved following dilatation therapy.  Plan repeat dilatation as needed

## 2013-06-19 NOTE — Patient Instructions (Signed)
Follow up as needed

## 2013-06-19 NOTE — Progress Notes (Signed)
History of Present Illness:  The patient has returned following upper endoscopy with dilatation.  No frank stricture was seen but she reports significant improvement in dysphagia.  Symptoms have subsided.  Her main complaint is dysgeusia.  He suffers from chronic sinus congestion.  Symptoms have not improved with PPIs.  She denies nausea.  She has undergone a thorough dental examination     Review of Systems: Pertinent positive and negative review of systems were noted in the above HPI section. All other review of systems were otherwise negative.    Current Medications, Allergies, Past Medical History, Past Surgical History, Family History and Social History were reviewed in [facility] electronic medical record  Vital signs were reviewed in today's medical record. Physical Exam: Well-developed well-nourished female

## 2013-06-25 IMAGING — CR DG CHEST 2V
2 series · 2 of 2 positions shown · non-contrast
Comparison: None.

CLINICAL DATA: Shortness of breath, fever, nausea.

CHEST - 2 VIEW

[w chest pa]
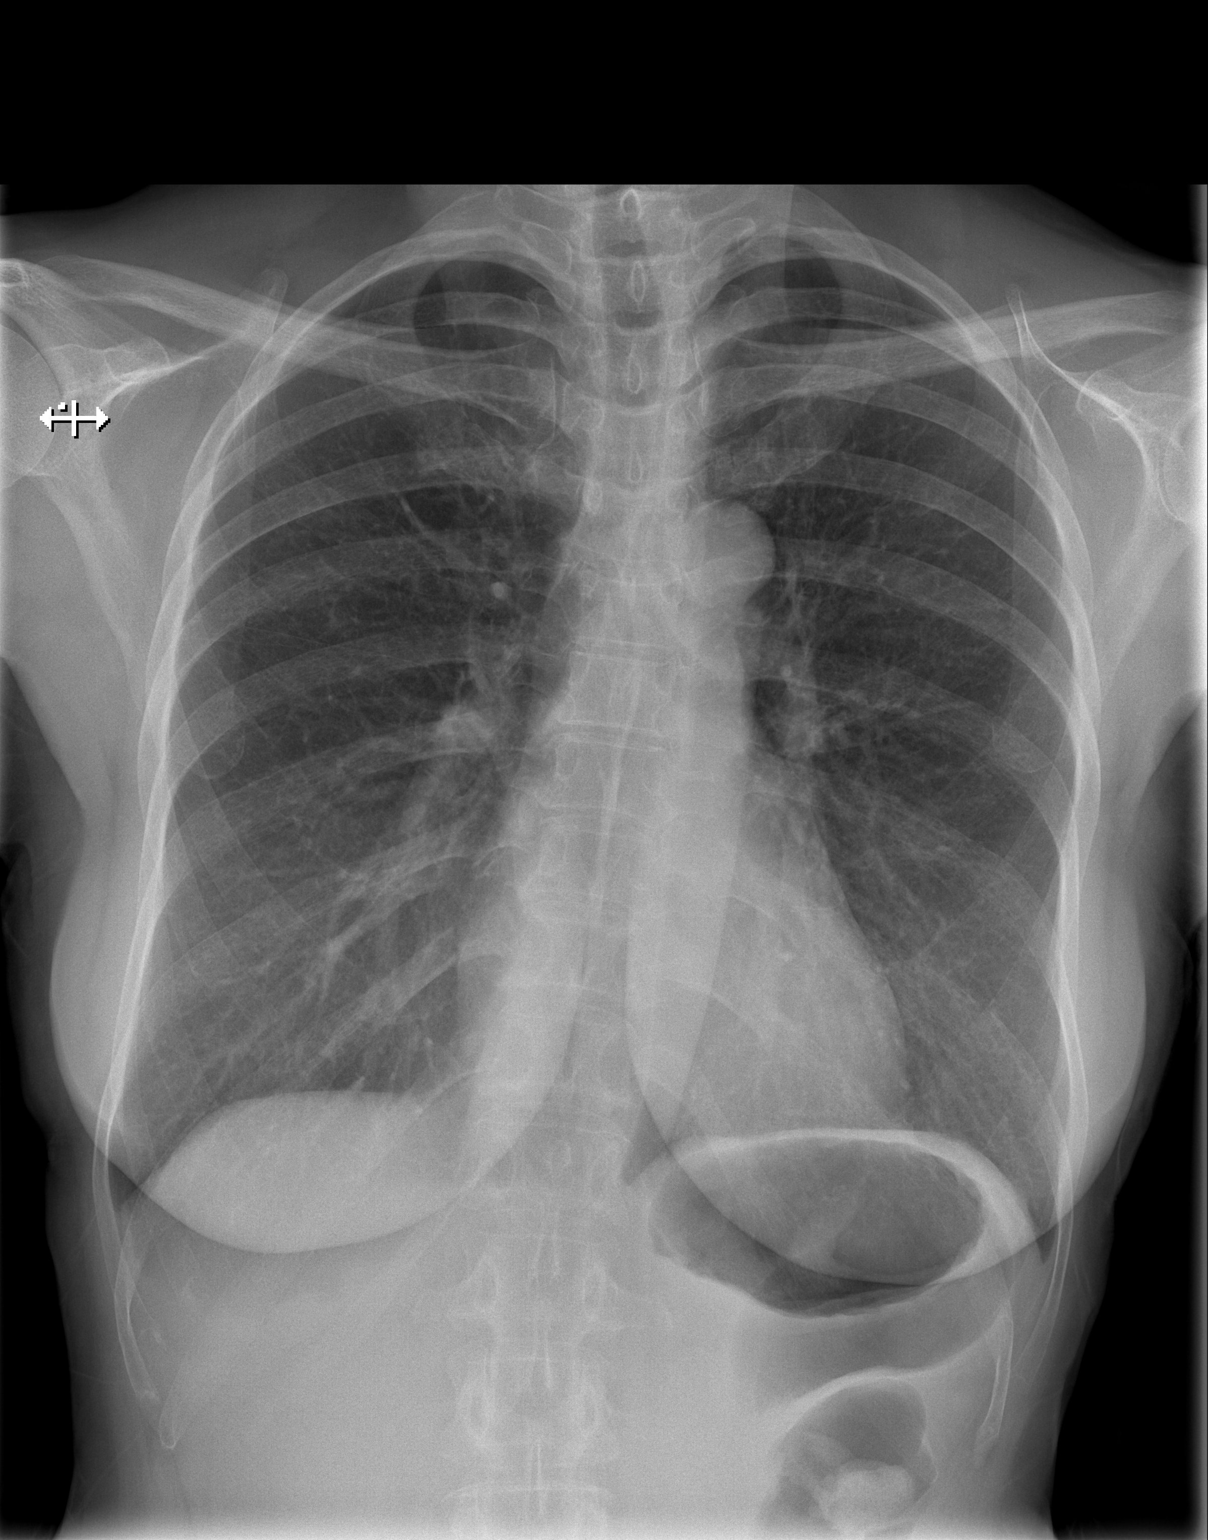

[w chest lat]
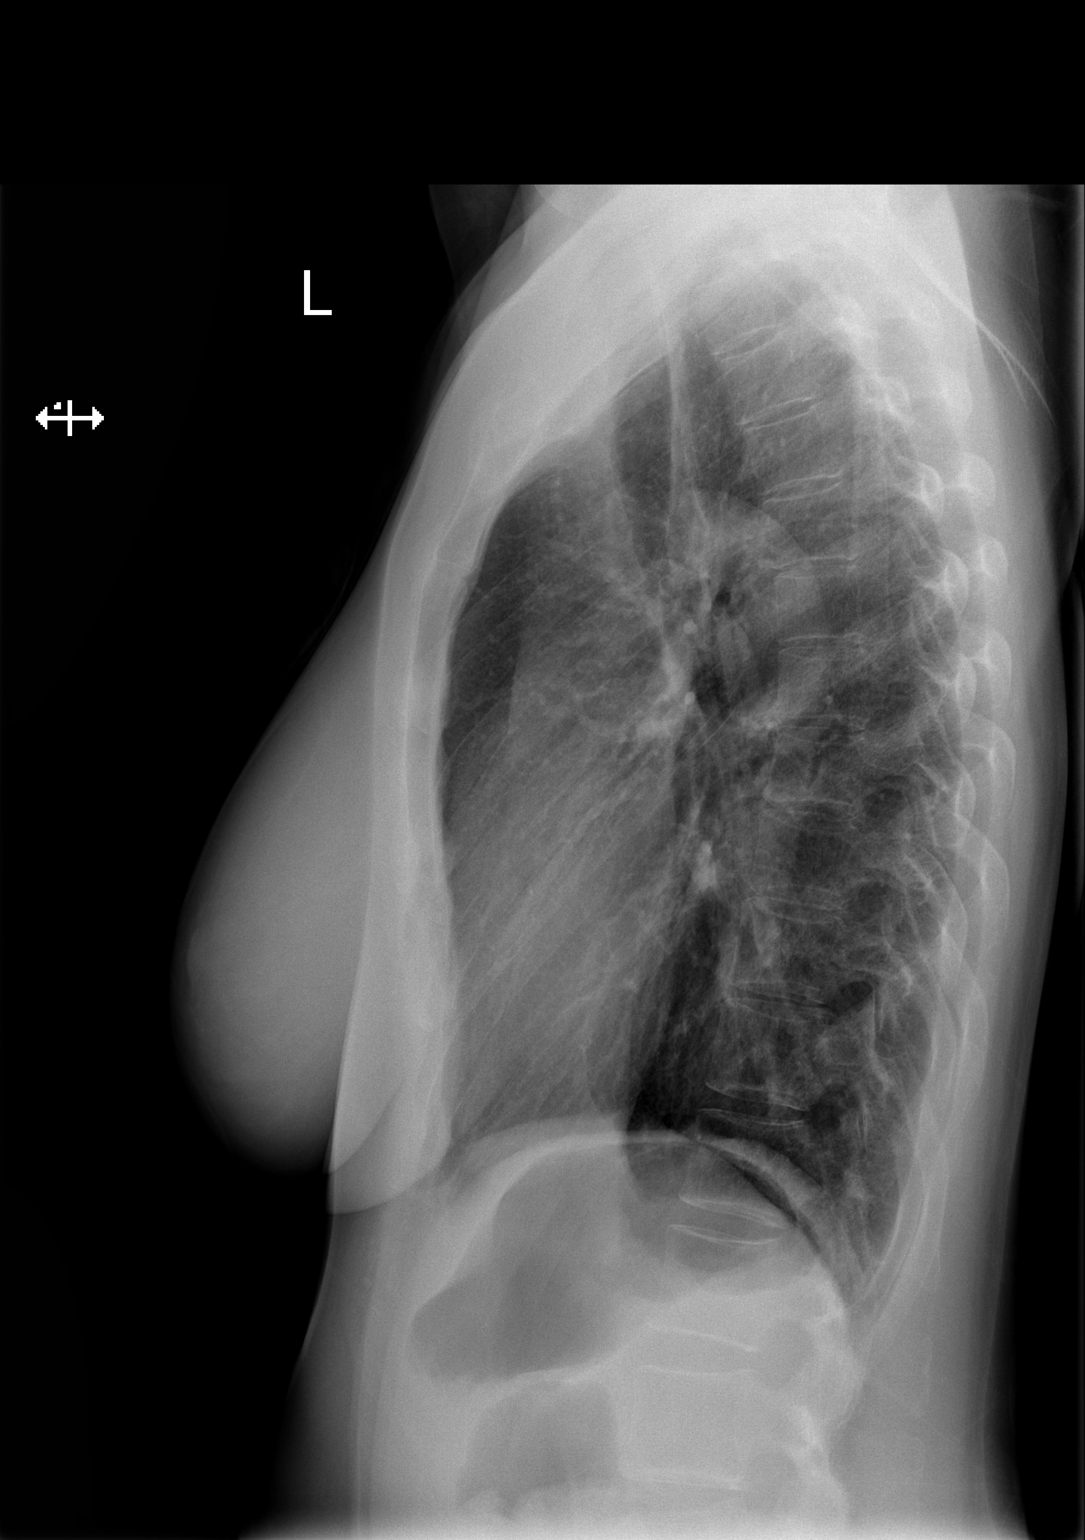

[2 of 2 positions shown; findings below may reference images not displayed]

FINDINGS: Heart and mediastinal contours are within normal limits.
No focal opacities or effusions.  No acute bony abnormality.
IMPRESSION: No active cardiopulmonary disease.

## 2013-07-08 ENCOUNTER — Other Ambulatory Visit: Payer: Self-pay | Admitting: Family Medicine

## 2013-09-10 ENCOUNTER — Other Ambulatory Visit: Payer: Self-pay | Admitting: Family Medicine

## 2013-10-19 ENCOUNTER — Ambulatory Visit: Payer: BC Managed Care – PPO | Admitting: Family Medicine

## 2013-10-27 ENCOUNTER — Ambulatory Visit (INDEPENDENT_AMBULATORY_CARE_PROVIDER_SITE_OTHER): Payer: BC Managed Care – PPO | Admitting: Family Medicine

## 2013-10-27 ENCOUNTER — Encounter: Payer: Self-pay | Admitting: Family Medicine

## 2013-10-27 VITALS — BP 120/72 | HR 98 | Temp 98.3°F | Ht 63.75 in | Wt 120.5 lb

## 2013-10-27 DIAGNOSIS — I10 Essential (primary) hypertension: Secondary | ICD-10-CM

## 2013-10-27 DIAGNOSIS — J029 Acute pharyngitis, unspecified: Secondary | ICD-10-CM | POA: Insufficient documentation

## 2013-10-27 DIAGNOSIS — L568 Other specified acute skin changes due to ultraviolet radiation: Secondary | ICD-10-CM

## 2013-10-27 LAB — POCT RAPID STREP A (OFFICE): RAPID STREP A SCREEN: NEGATIVE

## 2013-10-27 MED ORDER — LISINOPRIL 5 MG PO TABS: 5.0000 mg | ORAL_TABLET | Freq: Every day | ORAL | Status: DC

## 2013-10-27 NOTE — Progress Notes (Signed)
   Subjective:    Patient ID: Michelle Gill, female    DOB: 12/29/1950, 63 y.o.   MRN: [id number]  Sore Throat  This is a new problem. The current episode started in the past 7 days (2 days). The problem has been gradually worsening. Neither side of throat is experiencing more pain than the other. The maximum temperature recorded prior to her arrival was 100 - 100.9 F. The fever has been present for 1 to 2 days. Associated symptoms include congestion, swollen glands and trouble swallowing. Pertinent negatives include no abdominal pain, coughing, drooling, ear discharge, ear pain, plugged ear sensation, neck pain, shortness of breath, stridor or vomiting. Associated symptoms comments: No myalgia. She has had no exposure to strep or mono. She has tried NSAIDs for the symptoms. The treatment provided mild relief.     She is having continued photosensitivity from HCTZ. She wishes to change this.  No hx of edema or trials of other meds.  Lower dose did not stop photosensitivity.   BP Readings from Last 3 Encounters:  10/27/13 120/72  06/19/13 100/60  05/30/13 110/70   \   Review of Systems  HENT: Positive for congestion and trouble swallowing. Negative for drooling, ear discharge and ear pain.   Respiratory: Negative for cough, shortness of breath and stridor.   Cardiovascular: Negative for chest pain and leg swelling.  Gastrointestinal: Negative for vomiting and abdominal pain.  Musculoskeletal: Negative for neck pain.       Objective:   Physical Exam  Constitutional: Vital signs are normal. She appears well-developed and well-nourished. She is cooperative.  Non-toxic appearance. She does not appear ill. No distress.  HENT:  Head: Normocephalic.  Right Ear: Hearing, tympanic membrane, external ear and ear canal normal. Tympanic membrane is not erythematous, not retracted and not bulging.  Left Ear: Hearing, tympanic membrane, external ear and ear canal normal. Tympanic membrane is  not erythematous, not retracted and not bulging.  Nose: Mucosal edema and rhinorrhea present. Right sinus exhibits no maxillary sinus tenderness and no frontal sinus tenderness. Left sinus exhibits no maxillary sinus tenderness and no frontal sinus tenderness.  Mouth/Throat: Uvula is midline and mucous membranes are normal. Posterior oropharyngeal erythema present. No oropharyngeal exudate or posterior oropharyngeal edema.  Eyes: Conjunctivae, EOM and lids are normal. Pupils are equal, round, and reactive to light. Lids are everted and swept, no foreign bodies found.  Neck: Trachea normal and normal range of motion. Neck supple. Carotid bruit is not present. No mass and no thyromegaly present.  Cardiovascular: Normal rate, regular rhythm, S1 normal, S2 normal, normal heart sounds, intact distal pulses and normal pulses.  Exam reveals no gallop and no friction rub.   No murmur heard. Pulmonary/Chest: Effort normal and breath sounds normal. Not tachypneic. No respiratory distress. She has no decreased breath sounds. She has no wheezes. She has no rhonchi. She has no rales.  Abdominal: Soft. Normal appearance and bowel sounds are normal. There is no tenderness.  Neurological: She is alert.  Skin: Skin is warm, dry and intact. No rash noted.  Psychiatric: Her speech is normal and behavior is normal. Judgment and thought content normal. Her mood appears not anxious. Cognition and memory are normal. She does not exhibit a depressed mood.          Assessment & Plan:

## 2013-10-27 NOTE — Assessment & Plan Note (Signed)
Change to lisinopril 5 mg daily. Check Creatinine in 7-10 days.

## 2013-10-27 NOTE — Progress Notes (Signed)
Pre visit review using our clinic review tool, if applicable. No additional management support is needed unless otherwise documented below in the visit note. 

## 2013-10-27 NOTE — Patient Instructions (Addendum)
Mucinex DM for congestion. Push fluids, rest. Stop HCTZ, stert lisinopril. Return for nonfasting labs in 7-10 days.

## 2013-10-27 NOTE — Assessment & Plan Note (Signed)
Symptomatic care 

## 2013-10-30 ENCOUNTER — Telehealth: Payer: Self-pay | Admitting: Family Medicine

## 2013-10-30 NOTE — Telephone Encounter (Signed)
Relevant patient education assigned to patient using Emmi. ° °

## 2013-11-03 ENCOUNTER — Other Ambulatory Visit: Payer: BC Managed Care – PPO

## 2013-11-24 ENCOUNTER — Ambulatory Visit (INDEPENDENT_AMBULATORY_CARE_PROVIDER_SITE_OTHER): Payer: BC Managed Care – PPO | Admitting: Family Medicine

## 2013-11-24 ENCOUNTER — Encounter: Payer: Self-pay | Admitting: Family Medicine

## 2013-11-24 VITALS — BP 126/84 | HR 82 | Temp 98.3°F | Ht 63.75 in | Wt 120.2 lb

## 2013-11-24 DIAGNOSIS — I10 Essential (primary) hypertension: Secondary | ICD-10-CM

## 2013-11-24 MED ORDER — SPIRONOLACTONE 25 MG PO TABS: 25.0000 mg | ORAL_TABLET | Freq: Every day | ORAL | Status: DC

## 2013-11-24 NOTE — Patient Instructions (Signed)
Stop HCTZ. Change to aldactone. Follow BP at home. Call us with an update on BP in 2 weeks.  Call sooner if BP is very high.

## 2013-11-24 NOTE — Assessment & Plan Note (Signed)
She feels she needs a diuretic, but many have SE of photosensitivity. We will change to spirnolactone daily.  Follow BP. Call with BP measurements.

## 2013-11-24 NOTE — Progress Notes (Signed)
Pre visit review using our clinic review tool, if applicable. No additional management support is needed unless otherwise documented below in the visit note. 

## 2013-11-24 NOTE — Progress Notes (Signed)
   Subjective:    Patient ID: Michelle Gill, female    DOB: Jan 19, 1951, 63 y.o.   MRN: [id number]  HPI  63 year old female presents for HTN follow up 1 month after D/Cing HCTZ and changing to lisinopril 5 mg. She thought HCTZ was causing hypersensitivity.  She stopped lisinopril as she felt " weird" and legs were tingly. Took it for 4 days. Using medication without problems or lightheadedness:  YES. Chest pain with exertion:None Edema:None Short of breath:None Average home BPs: as below. Other issues: BP Readings from Last 3 Encounters:  11/24/13 126/84  10/27/13 120/72  06/19/13 100/60   She would like to try a different diuretic.    Review of Systems  Constitutional: Negative for fever and fatigue.  HENT: Negative for ear pain.   Eyes: Negative for pain.  Respiratory: Negative for chest tightness and shortness of breath.   Cardiovascular: Negative for chest pain, palpitations and leg swelling.  Gastrointestinal: Negative for abdominal pain.  Genitourinary: Negative for dysuria.       Objective:   Physical Exam  Constitutional: Vital signs are normal. She appears well-developed and well-nourished. She is cooperative.  Non-toxic appearance. She does not appear ill. No distress.  HENT:  Head: Normocephalic.  Right Ear: Hearing, tympanic membrane, external ear and ear canal normal. Tympanic membrane is not erythematous, not retracted and not bulging.  Left Ear: Hearing, tympanic membrane, external ear and ear canal normal. Tympanic membrane is not erythematous, not retracted and not bulging.  Nose: No mucosal edema or rhinorrhea. Right sinus exhibits no maxillary sinus tenderness and no frontal sinus tenderness. Left sinus exhibits no maxillary sinus tenderness and no frontal sinus tenderness.  Mouth/Throat: Uvula is midline, oropharynx is clear and moist and mucous membranes are normal.  Eyes: Conjunctivae, EOM and lids are normal. Pupils are equal, round, and reactive  to light. Lids are everted and swept, no foreign bodies found.  Neck: Trachea normal and normal range of motion. Neck supple. Carotid bruit is not present. No mass and no thyromegaly present.  Cardiovascular: Normal rate, regular rhythm, S1 normal, S2 normal, normal heart sounds, intact distal pulses and normal pulses.  Exam reveals no gallop and no friction rub.   No murmur heard. Pulmonary/Chest: Effort normal and breath sounds normal. Not tachypneic. No respiratory distress. She has no decreased breath sounds. She has no wheezes. She has no rhonchi. She has no rales.  Abdominal: Soft. Normal appearance and bowel sounds are normal. There is no tenderness.  Neurological: She is alert.  Skin: Skin is warm, dry and intact. No rash noted.  Psychiatric: Her speech is normal and behavior is normal. Judgment and thought content normal. Her mood appears not anxious. Cognition and memory are normal. She does not exhibit a depressed mood.          Assessment & Plan:

## 2014-01-02 ENCOUNTER — Other Ambulatory Visit: Payer: Self-pay | Admitting: Family Medicine

## 2014-01-02 NOTE — Telephone Encounter (Signed)
Last office visit 11/24/2013.  Not on current medication list.  Ok to refill?

## 2014-01-22 ENCOUNTER — Other Ambulatory Visit: Payer: Self-pay | Admitting: Family Medicine

## 2014-03-23 ENCOUNTER — Encounter: Payer: Self-pay | Admitting: Family Medicine

## 2014-03-23 ENCOUNTER — Ambulatory Visit (INDEPENDENT_AMBULATORY_CARE_PROVIDER_SITE_OTHER): Payer: BC Managed Care – PPO | Admitting: Family Medicine

## 2014-03-23 VITALS — BP 116/68 | HR 85 | Temp 98.5°F | Ht 63.75 in | Wt 120.5 lb

## 2014-03-23 DIAGNOSIS — M1710 Unilateral primary osteoarthritis, unspecified knee: Secondary | ICD-10-CM

## 2014-03-23 DIAGNOSIS — M25569 Pain in unspecified knee: Secondary | ICD-10-CM

## 2014-03-23 DIAGNOSIS — M25562 Pain in left knee: Secondary | ICD-10-CM

## 2014-03-23 DIAGNOSIS — M25561 Pain in right knee: Secondary | ICD-10-CM

## 2014-03-23 DIAGNOSIS — M170 Bilateral primary osteoarthritis of knee: Secondary | ICD-10-CM

## 2014-03-23 DIAGNOSIS — M810 Age-related osteoporosis without current pathological fracture: Secondary | ICD-10-CM

## 2014-03-23 MED ORDER — DICLOFENAC SODIUM 75 MG PO TBEC: 75.0000 mg | DELAYED_RELEASE_TABLET | Freq: Two times a day (BID) | ORAL | Status: DC

## 2014-03-23 MED ORDER — RALOXIFENE HCL 60 MG PO TABS: 60.0000 mg | ORAL_TABLET | Freq: Every day | ORAL | Status: DC

## 2014-03-23 NOTE — Progress Notes (Signed)
Pre visit review using our clinic review tool, if applicable. No additional management support is needed unless otherwise documented below in the visit note. 
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
## 2014-03-23 NOTE — Assessment & Plan Note (Signed)
Refuses Steroid injection. Trial of diclofenac twice daily. If not improving consider course of oral steroids ( recommended against given  Risk associated including worsening bone density)

## 2014-03-23 NOTE — Assessment & Plan Note (Signed)
Pt has not tolerated or refused other options.  Low risk for CVA and CAD... Trial of evista.  Can voit D an exercise.

## 2014-03-23 NOTE — Patient Instructions (Addendum)
Continue ca  600 mg twice daily and vit D 400 IU twice daily. Start regular weight bearing exercise. Start evista daily. Call if SE not tolerated. Start diclofenac twice daily as needed for knee pain. Call if SE.  Can also start   Call in two week if not improving.

## 2014-06-01 ENCOUNTER — Encounter: Payer: Self-pay | Admitting: Gastroenterology

## 2014-06-15 ENCOUNTER — Telehealth: Payer: Self-pay | Admitting: *Deleted

## 2014-06-15 DIAGNOSIS — F411 Generalized anxiety disorder: Secondary | ICD-10-CM

## 2014-06-15 MED ORDER — ALPRAZOLAM 0.5 MG PO TBDP: 0.5000 mg | ORAL_TABLET | Freq: Every day | ORAL | Status: DC | PRN

## 2014-06-15 MED ORDER — ALPRAZOLAM 0.5 MG PO TABS: 0.2500 mg | ORAL_TABLET | Freq: Every day | ORAL | Status: DC | PRN

## 2014-06-15 NOTE — Telephone Encounter (Signed)
We can prescribe this as she has been on it in the past. I reviewed documentation in her old records. Will send in rx for alprazolam to use in limited fashion for anxiety.

## 2014-06-15 NOTE — Telephone Encounter (Signed)
Left message on cell phone for patient to return my call.

## 2014-06-15 NOTE — Telephone Encounter (Signed)
Mrs. Clover notified prescription has been called in to [facility].

## 2014-06-15 NOTE — Telephone Encounter (Signed)
Spoke with Mrs. Bernardini.  She states Dr. Inda Merlin, her former PCP prescribed the alprazolam for her.  She states she only takes it on a rare occasion and only takes 1/2 of a 0.5 mg tablet as needed.  I did find documentation of that in her records for Dr. Inda Merlin.  She states she didn't ask Dr. Diona Browner for a refill at her appointment because she still had some alprazolam and didn't need a refill at that time.  I advised I would send a note back to Dr. Diona Browner but that she still may require an office visit before prescribing this medication for her.  Please advise.

## 2014-06-15 NOTE — Telephone Encounter (Signed)
Received fax for [facility] requesting refill for Alprazolam 0.5 mg.  Not on current medication list.  Last office visit 03/23/2014.  Ok to refill?

## 2014-06-15 NOTE — Telephone Encounter (Signed)
Patient returned your call.  Please call her back at home [phone].

## 2014-06-15 NOTE — Telephone Encounter (Signed)
Please call pt. We do not have record of her being on this before.  She will need an appt to discuss anxiety treatment.

## 2014-06-19 ENCOUNTER — Ambulatory Visit (INDEPENDENT_AMBULATORY_CARE_PROVIDER_SITE_OTHER): Payer: BC Managed Care – PPO | Admitting: Family Medicine

## 2014-06-19 ENCOUNTER — Encounter: Payer: Self-pay | Admitting: Family Medicine

## 2014-06-19 ENCOUNTER — Ambulatory Visit (INDEPENDENT_AMBULATORY_CARE_PROVIDER_SITE_OTHER)
Admission: RE | Admit: 2014-06-19 | Discharge: 2014-06-19 | Disposition: A | Discharge status: Home / Self Care | Payer: BC Managed Care – PPO | Source: Ambulatory Visit | Attending: Family Medicine | Admitting: Family Medicine

## 2014-06-19 VITALS — BP 110/70 | HR 87 | Temp 98.4°F | Ht 63.75 in | Wt 120.8 lb

## 2014-06-19 DIAGNOSIS — M810 Age-related osteoporosis without current pathological fracture: Secondary | ICD-10-CM

## 2014-06-19 DIAGNOSIS — Z23 Encounter for immunization: Secondary | ICD-10-CM

## 2014-06-19 DIAGNOSIS — R0789 Other chest pain: Secondary | ICD-10-CM

## 2014-06-19 NOTE — Progress Notes (Signed)
Pre visit review using our clinic review tool, if applicable. No additional management support is needed unless otherwise documented below in the visit note. 

## 2014-06-19 NOTE — Assessment & Plan Note (Addendum)
MSK strain versus fracture given high frisk with osteoporosis.  Pain control, deep breaths to prevent PNA, will eval with X-ray.

## 2014-06-19 NOTE — Patient Instructions (Signed)
We will call with X-ray results. Use aleve twice daily for apin. Call if pain not controlled.  Make sure to try to take deep breaths. Gentle stretching as improving.

## 2014-06-19 NOTE — Progress Notes (Signed)
   Subjective:    Patient ID: Michelle Gill, female    DOB: 05/26/1951, 63 y.o.   MRN: [id number]  HPI  63 year old female with osteoporosis presents with new onset pain in chest wall x 7 AM this morning. She reports she was on her stomach and she twisted her body, felt a soft thump, started having sharp pain through right lower chest wall at bra line. Pain is with deep breaths, and any movement , constant. Tender to touch over right lateral chest wall. No SOB, no wheeze, no fever. Taking shallow breaths to decrease pain.  She has not taken any meds or treated pain.       Review of Systems  Constitutional: Negative for fever and fatigue.  HENT: Negative for ear pain.   Eyes: Negative for pain.  Respiratory: Negative for chest tightness and shortness of breath.   Cardiovascular: Negative for chest pain, palpitations and leg swelling.  Gastrointestinal: Negative for abdominal pain.  Genitourinary: Negative for dysuria.       Objective:   Physical Exam  Constitutional: Vital signs are normal. She appears well-developed and well-nourished. She is cooperative.  Non-toxic appearance. She does not appear ill. No distress.  HENT:  Head: Normocephalic.  Right Ear: Hearing, tympanic membrane, external ear and ear canal normal. Tympanic membrane is not erythematous, not retracted and not bulging.  Left Ear: Hearing, tympanic membrane, external ear and ear canal normal. Tympanic membrane is not erythematous, not retracted and not bulging.  Nose: No mucosal edema or rhinorrhea. Right sinus exhibits no maxillary sinus tenderness and no frontal sinus tenderness. Left sinus exhibits no maxillary sinus tenderness and no frontal sinus tenderness.  Mouth/Throat: Uvula is midline, oropharynx is clear and moist and mucous membranes are normal.  Eyes: Conjunctivae, EOM and lids are normal. Pupils are equal, round, and reactive to light. Lids are everted and swept, no foreign bodies found.  Neck:  Trachea normal and normal range of motion. Neck supple. Carotid bruit is not present. No mass and no thyromegaly present.  Cardiovascular: Normal rate, regular rhythm, S1 normal, S2 normal, normal heart sounds, intact distal pulses and normal pulses.  Exam reveals no gallop and no friction rub.   No murmur heard. Pulmonary/Chest: Effort normal and breath sounds normal. Not tachypneic. No respiratory distress. She has no decreased breath sounds. She has no wheezes. She has no rhonchi. She has no rales.  Focal ttp over anterior right 9th rib  Abdominal: Soft. Normal appearance and bowel sounds are normal. There is no tenderness.  Neurological: She is alert.  Skin: Skin is warm, dry and intact. No rash noted.  Psychiatric: Her speech is normal and behavior is normal. Judgment and thought content normal. Her mood appears not anxious. Cognition and memory are normal. She does not exhibit a depressed mood.          Assessment & Plan:

## 2014-07-06 ENCOUNTER — Other Ambulatory Visit: Payer: Self-pay | Admitting: Family Medicine

## 2014-07-24 ENCOUNTER — Encounter: Payer: Self-pay | Admitting: Family Medicine

## 2014-07-24 ENCOUNTER — Ambulatory Visit (INDEPENDENT_AMBULATORY_CARE_PROVIDER_SITE_OTHER): Payer: BC Managed Care – PPO | Admitting: Family Medicine

## 2014-07-24 VITALS — BP 106/64 | HR 76 | Temp 98.2°F | Ht 63.75 in | Wt 121.5 lb

## 2014-07-24 DIAGNOSIS — M545 Low back pain, unspecified: Secondary | ICD-10-CM | POA: Insufficient documentation

## 2014-07-24 DIAGNOSIS — D509 Iron deficiency anemia, unspecified: Secondary | ICD-10-CM

## 2014-07-24 DIAGNOSIS — R531 Weakness: Secondary | ICD-10-CM | POA: Insufficient documentation

## 2014-07-24 DIAGNOSIS — R5383 Other fatigue: Secondary | ICD-10-CM | POA: Insufficient documentation

## 2014-07-24 DIAGNOSIS — I10 Essential (primary) hypertension: Secondary | ICD-10-CM

## 2014-07-24 DIAGNOSIS — Z841 Family history of disorders of kidney and ureter: Secondary | ICD-10-CM | POA: Insufficient documentation

## 2014-07-24 DIAGNOSIS — E559 Vitamin D deficiency, unspecified: Secondary | ICD-10-CM

## 2014-07-24 LAB — POCT URINALYSIS DIPSTICK
Bilirubin, UA: NEGATIVE
Blood, UA: NEGATIVE
Glucose, UA: NEGATIVE
KETONES UA: NEGATIVE
LEUKOCYTES UA: NEGATIVE
Nitrite, UA: NEGATIVE
PH UA: 6
PROTEIN UA: NEGATIVE
Spec Grav, UA: 1.01
UROBILINOGEN UA: 0.2

## 2014-07-24 NOTE — Assessment & Plan Note (Signed)
Doubt pain in low back is from kidneys. Given pt concern and brother unclear histroy causing kidney failure.. Will eval with microalbumin and creatinine.

## 2014-07-24 NOTE — Addendum Note (Signed)
Addended by: Marchia Bond on: 07/24/2014 04:56 PM   Modules accepted: Orders

## 2014-07-24 NOTE — Assessment & Plan Note (Signed)
Due for re-eval. 

## 2014-07-24 NOTE — Assessment & Plan Note (Signed)
Eval with labs from secondary cause.

## 2014-07-24 NOTE — Progress Notes (Signed)
Pre visit review using our clinic review tool, if applicable. No additional management support is needed unless otherwise documented below in the visit note. 

## 2014-07-24 NOTE — Patient Instructions (Addendum)
Stop at lab on way ut. Return for fasting labs when you can.

## 2014-07-24 NOTE — Assessment & Plan Note (Signed)
Well controlled on HCTZ 

## 2014-07-24 NOTE — Progress Notes (Signed)
   Subjective:    Patient ID: Michelle Gill, female    DOB: 04/28/1951, 63 y.o.   MRN: [id number]  HPI 63 year old female presents with new onset pain in  B lower back in last 2- 3 months. She urinates a lot. She does drink a lot of soda. She is also tired all the time, for several months.  She has been under a lot of stress lately. She is concerned she may have kidney disease. Hands and feet are darker color and she has heard this is from kidney issues.  No dysuria, no change in urine odor or color.  Hx of iron def anemia.. On iron.   Hx of vit D def.  He brother passed away 07/30/23 from prostate cancer, DM, HTN. He also had kidney disease from HTN and DM.  On HCTZ for HTN. BP Readings from Last 3 Encounters:  07/24/14 106/64  06/19/14 110/70  03/23/14 116/68       Review of Systems  Constitutional: Positive for fatigue. Negative for fever.  HENT: Negative for ear pain.   Eyes: Negative for pain.  Respiratory: Negative for chest tightness and shortness of breath.   Cardiovascular: Negative for chest pain, palpitations and leg swelling.  Gastrointestinal: Negative for abdominal pain.  Genitourinary: Negative for dysuria.       Objective:   Physical Exam  Constitutional: Vital signs are normal. She appears well-developed and well-nourished. She is cooperative.  Non-toxic appearance. She does not appear ill. No distress.  HENT:  Head: Normocephalic.  Right Ear: Hearing, tympanic membrane, external ear and ear canal normal. Tympanic membrane is not erythematous, not retracted and not bulging.  Left Ear: Hearing, tympanic membrane, external ear and ear canal normal. Tympanic membrane is not erythematous, not retracted and not bulging.  Nose: No mucosal edema or rhinorrhea. Right sinus exhibits no maxillary sinus tenderness and no frontal sinus tenderness. Left sinus exhibits no maxillary sinus tenderness and no frontal sinus tenderness.  Mouth/Throat: Uvula is midline,  oropharynx is clear and moist and mucous membranes are normal.  Eyes: Conjunctivae, EOM and lids are normal. Pupils are equal, round, and reactive to light. Lids are everted and swept, no foreign bodies found.  Neck: Trachea normal and normal range of motion. Neck supple. Carotid bruit is not present. No thyroid mass and no thyromegaly present.  Cardiovascular: Normal rate, regular rhythm, S1 normal, S2 normal, normal heart sounds, intact distal pulses and normal pulses.  Exam reveals no gallop and no friction rub.   No murmur heard. Pulmonary/Chest: Effort normal and breath sounds normal. No tachypnea. No respiratory distress. She has no decreased breath sounds. She has no wheezes. She has no rhonchi. She has no rales.  Abdominal: Soft. Normal appearance and bowel sounds are normal. There is no tenderness. There is no CVA tenderness.  Musculoskeletal:       Lumbar back: She exhibits tenderness. She exhibits normal range of motion and no bony tenderness.  Neg SLR  Neurological: She is alert.  Skin: Skin is warm, dry and intact. No rash noted.  Psychiatric: Her speech is normal and behavior is normal. Judgment and thought content normal. Her mood appears not anxious. Cognition and memory are normal. She does not exhibit a depressed mood.          Assessment & Plan:

## 2014-07-24 NOTE — Assessment & Plan Note (Signed)
Most likely MSK strain mild. If labs normal treat with  Heat, gentle stretching.

## 2014-07-25 LAB — COMPREHENSIVE METABOLIC PANEL
ALT: 19 U/L (ref 0–35)
AST: 25 U/L (ref 0–37)
Albumin: 4.3 g/dL (ref 3.5–5.2)
Alkaline Phosphatase: 83 U/L (ref 39–117)
BUN: 7 mg/dL (ref 6–23)
CALCIUM: 9.8 mg/dL (ref 8.4–10.5)
CO2: 29 meq/L (ref 19–32)
CREATININE: 0.7 mg/dL (ref 0.4–1.2)
Chloride: 104 mEq/L (ref 96–112)
GFR: 115.49 mL/min (ref >60.00)
Glucose, Bld: 87 mg/dL (ref 70–99)
Potassium: 3.9 mEq/L (ref 3.5–5.1)
Sodium: 143 mEq/L (ref 135–145)
TOTAL PROTEIN: 6.5 g/dL (ref 6.0–8.3)
Total Bilirubin: 0.9 mg/dL (ref 0.2–1.2)

## 2014-07-25 LAB — CBC WITH DIFFERENTIAL/PLATELET
BASOS PCT: 0.7 % (ref 0.0–3.0)
Basophils Absolute: 0 10*3/uL (ref 0.0–0.1)
EOS ABS: 0.2 10*3/uL (ref 0.0–0.7)
Eosinophils Relative: 3.8 % (ref 0.0–5.0)
HCT: 38.9 % (ref 36.0–46.0)
Hemoglobin: 12.1 g/dL (ref 12.0–15.0)
Lymphocytes Relative: 31.9 % (ref 12.0–46.0)
Lymphs Abs: 1.8 10*3/uL (ref 0.7–4.0)
MCHC: 31.2 g/dL (ref 30.0–36.0)
MCV: 76.8 fl — ABNORMAL LOW (ref 78.0–100.0)
Monocytes Absolute: 0.4 10*3/uL (ref 0.1–1.0)
Monocytes Relative: 7.4 % (ref 3.0–12.0)
NEUTROS PCT: 56.2 % (ref 43.0–77.0)
Neutro Abs: 3.2 10*3/uL (ref 1.4–7.7)
PLATELETS: 198 10*3/uL (ref 150.0–400.0)
RBC: 5.07 Mil/uL (ref 3.87–5.11)
RDW: 14 % (ref 11.5–15.5)
WBC: 5.7 10*3/uL (ref 4.0–10.5)

## 2014-07-25 LAB — VITAMIN B12: Vitamin B-12: 537 pg/mL (ref 211–911)

## 2014-07-25 LAB — MICROALBUMIN / CREATININE URINE RATIO
Creatinine,U: 20.6 mg/dL
MICROALB/CREAT RATIO: 1 mg/g (ref 0.0–30.0)
Microalb, Ur: 0.2 mg/dL (ref 0.0–1.9)

## 2014-07-25 LAB — VITAMIN D 25 HYDROXY (VIT D DEFICIENCY, FRACTURES): VITD: 56.71 ng/mL (ref 30.00–100.00)

## 2014-07-25 LAB — TSH: TSH: 1.09 u[IU]/mL (ref 0.35–4.50)

## 2014-08-17 ENCOUNTER — Other Ambulatory Visit (INDEPENDENT_AMBULATORY_CARE_PROVIDER_SITE_OTHER): Payer: BC Managed Care – PPO

## 2014-08-17 ENCOUNTER — Telehealth: Payer: Self-pay | Admitting: Family Medicine

## 2014-08-17 DIAGNOSIS — Z13220 Encounter for screening for lipoid disorders: Secondary | ICD-10-CM

## 2014-08-17 DIAGNOSIS — M810 Age-related osteoporosis without current pathological fracture: Secondary | ICD-10-CM

## 2014-08-17 DIAGNOSIS — E559 Vitamin D deficiency, unspecified: Secondary | ICD-10-CM

## 2014-08-17 DIAGNOSIS — D509 Iron deficiency anemia, unspecified: Secondary | ICD-10-CM

## 2014-08-17 LAB — LIPID PANEL
CHOL/HDL RATIO: 2
Cholesterol: 238 mg/dL — ABNORMAL HIGH (ref 0–200)
HDL: 101.7 mg/dL (ref >39.00)
LDL Cholesterol: 125 mg/dL — ABNORMAL HIGH (ref 0–99)
NonHDL: 136.3
TRIGLYCERIDES: 55 mg/dL (ref 0.0–149.0)
VLDL: 11 mg/dL (ref 0.0–40.0)

## 2014-08-17 NOTE — Telephone Encounter (Signed)
-----   Message from Ellamae Sia sent at 08/08/2014  3:08 PM EST ----- Regarding: Lab orders for Friday, 12.11.15 Patient is scheduled for CPX labs, please order future labs, Thanks , Karna Christmas

## 2014-08-24 ENCOUNTER — Ambulatory Visit (INDEPENDENT_AMBULATORY_CARE_PROVIDER_SITE_OTHER): Payer: BC Managed Care – PPO | Admitting: Family Medicine

## 2014-08-24 ENCOUNTER — Encounter: Payer: Self-pay | Admitting: Family Medicine

## 2014-08-24 VITALS — BP 120/70 | HR 74 | Temp 97.6°F | Ht 63.5 in | Wt 120.2 lb

## 2014-08-24 DIAGNOSIS — R5382 Chronic fatigue, unspecified: Secondary | ICD-10-CM

## 2014-08-24 DIAGNOSIS — Z0000 Encounter for general adult medical examination without abnormal findings: Secondary | ICD-10-CM

## 2014-08-24 DIAGNOSIS — Z23 Encounter for immunization: Secondary | ICD-10-CM

## 2014-08-24 DIAGNOSIS — M810 Age-related osteoporosis without current pathological fracture: Secondary | ICD-10-CM

## 2014-08-24 NOTE — Addendum Note (Signed)
Addended by: Eliezer Lofts E on: 08/24/2014 04:36 PM   Modules accepted: SmartSet

## 2014-08-24 NOTE — Progress Notes (Signed)
The patient is here for annual wellness exam and preventative care. non medicare.   Hypertension: Well controlled on HCTZ. Non improvement in sunsensitivity on lower dose BP Readings from Last 3 Encounters:  08/24/14 120/70  07/24/14 106/64  06/19/14 110/70  Using medication without problems or lightheadedness: None Chest pain with exertion:None Edema:None Short of breath:None Average home BPs: 110/760s Other issues:  She has been under a lot of stress form helping to care for her mother.  She denies depression and anxiety.  Rarely using alprazolam.  Reviewed labs in detail..  Chol LDL at goal <130. Lab Results  Component Value Date   CHOL 238* 08/17/2014   HDL 101.70 08/17/2014   LDLCALC 125* 08/17/2014   LDLDIRECT 116.0 05/24/2013   TRIG 55.0 08/17/2014   CHOLHDL 2 08/17/2014  Diet: Has cut back on ice cream, eating a lot of eggs for protein.  Fatigue later in the day. When wakes up in AM feels rested. She sleeps in segments, has some issues going back to sleep at night, worried about Mom. Getting about 4-6 hrs of sleep at night  Nml TSH, nml B12, nml vit D  NO DOE, no muscle weakness, no joint pain  No unexpected weight loss, no joint pain, no rash.   Review of Systems  Constitutional: Negative for fever and fatigue.  HENT: Negative for ear pain.  Eyes: Negative for pain.  Respiratory: Negative for chest tightness and shortness of breath.  Cardiovascular: Negative for chest pain, palpitations and leg swelling.  Gastrointestinal: Negative for abdominal pain.  Genitourinary:  Negative for dysuria and pelvic pain.      Objective:   Physical Exam  Constitutional: Vital signs are normal. She appears well-developed and well-nourished. She is cooperative. Non-toxic appearance. She does not appear ill. No distress.  HENT:  Head: Normocephalic.  Right Ear: Hearing, tympanic membrane, external ear and ear canal normal.  Left Ear: Hearing, tympanic membrane,  external ear and ear canal normal.  Nose: Nose normal.  Eyes: Conjunctivae, EOM and lids are normal. Pupils are equal, round, and reactive to light. Lids are everted and swept, no foreign bodies found.  Neck: Trachea normal and normal range of motion. Neck supple. Carotid bruit is not present. No mass and no thyromegaly present.  Cardiovascular: Normal rate, regular rhythm, S1 normal, S2 normal, normal heart sounds and intact distal pulses. Exam reveals no gallop.  No murmur heard. Pulmonary/Chest: Effort normal and breath sounds normal. No respiratory distress. She has no wheezes. She has no rhonchi. She has no rales.  Abdominal: Soft. Normal appearance and bowel sounds are normal. She exhibits no distension, no fluid wave, no abdominal bruit and no mass. There is no hepatosplenomegaly. There is no tenderness. There is no rebound, no guarding and no CVA tenderness. No hernia.  Genitourinary: No breast swelling, tenderness, discharge or bleeding. Pelvic exam was performed with patient supine.  DVE: wnl, no masses , nml B ovaries. Lymphadenopathy:   She has no cervical adenopathy.   She has no axillary adenopathy.  Neurological: She is alert. She has normal strength. No cranial nerve deficit or sensory deficit.  Skin: Skin is warm, dry and intact. No rash noted.  Psychiatric: Her speech is normal and behavior is normal. Judgment normal. Her mood appears not anxious. Cognition and memory are normal. She does not exhibit a depressed mood.          Assessment & Plan:  The patient's preventative maintenance and recommended screening tests for an annual wellness exam  were reviewed in full today. Brought up to date unless services declined.  Counselled on the importance of diet, exercise, and its role in overall health and mortality. The patient's FH and SH was reviewed, including their home life, tobacco status, and drug and alcohol status.    Mammogram 03/2013 nml  Colonoscopy 2010,  Dr. Deatra Ina, plans repeat in 2020.  Last DEXA: 2012 osteoporosis, on evista for 1 year. SE fosamax. Nervous about forteo. PAP/DVE: no pap indicated partial hystec, no new abdominal symptoms, no ovarian cancer in family.  DVE every other year. Vaccines:uptodate with PNA, Tdap, zoster,  Flu,  Given prevnar today

## 2014-08-24 NOTE — Patient Instructions (Addendum)
Try melatonin 3 mg at bedtime.  Work on [employer].  No napping during the day.  Work on regular exercise.  Schedule mammogram on your own.  Stop at front desk to set up bone density.

## 2014-08-24 NOTE — Assessment & Plan Note (Signed)
Nml lab eval., no clear cariac source. Likely due to inadequate rest at night, Increase exercise and start melatonin at night.

## 2014-08-24 NOTE — Progress Notes (Signed)
Pre visit review using our clinic review tool, if applicable. No additional management support is needed unless otherwise documented below in the visit note. 

## 2014-08-25 ENCOUNTER — Other Ambulatory Visit: Payer: Self-pay | Admitting: Family Medicine

## 2014-08-26 NOTE — Telephone Encounter (Signed)
Last office visit 08/24/2014.  Ok to refill?

## 2014-09-28 ENCOUNTER — Other Ambulatory Visit: Payer: Self-pay | Admitting: Family Medicine

## 2014-12-11 ENCOUNTER — Encounter: Payer: Self-pay | Admitting: Family Medicine

## 2014-12-25 ENCOUNTER — Encounter: Payer: Self-pay | Admitting: Family Medicine

## 2015-01-08 ENCOUNTER — Ambulatory Visit (INDEPENDENT_AMBULATORY_CARE_PROVIDER_SITE_OTHER): Payer: BLUE CROSS/BLUE SHIELD | Admitting: Family Medicine

## 2015-01-08 ENCOUNTER — Encounter: Payer: Self-pay | Admitting: Family Medicine

## 2015-01-08 VITALS — BP 120/64 | HR 82 | Temp 98.7°F | Ht 63.5 in | Wt 122.8 lb

## 2015-01-08 DIAGNOSIS — D509 Iron deficiency anemia, unspecified: Secondary | ICD-10-CM

## 2015-01-08 DIAGNOSIS — M810 Age-related osteoporosis without current pathological fracture: Secondary | ICD-10-CM | POA: Diagnosis not present

## 2015-01-08 DIAGNOSIS — E559 Vitamin D deficiency, unspecified: Secondary | ICD-10-CM

## 2015-01-08 DIAGNOSIS — M545 Low back pain, unspecified: Secondary | ICD-10-CM

## 2015-01-08 DIAGNOSIS — M150 Primary generalized (osteo)arthritis: Secondary | ICD-10-CM

## 2015-01-08 DIAGNOSIS — M25562 Pain in left knee: Secondary | ICD-10-CM

## 2015-01-08 DIAGNOSIS — M159 Polyosteoarthritis, unspecified: Secondary | ICD-10-CM

## 2015-01-08 DIAGNOSIS — Z13220 Encounter for screening for lipoid disorders: Secondary | ICD-10-CM

## 2015-01-08 DIAGNOSIS — M25561 Pain in right knee: Secondary | ICD-10-CM

## 2015-01-08 MED ORDER — TRAMADOL HCL 50 MG PO TABS: 50.0000 mg | ORAL_TABLET | Freq: Four times a day (QID) | ORAL | Status: DC | PRN

## 2015-01-08 NOTE — Progress Notes (Signed)
Pre visit review using our clinic review tool, if applicable. No additional management support is needed unless otherwise documented below in the visit note. 

## 2015-01-08 NOTE — Patient Instructions (Addendum)
Stop at lab on way out. Work on [employer] bearing exercise, ca and vit D. Can try glucosamine 500 mg 1-3 tabs daily for inflammation and arthritis.  Can use tramadol for pain as needed daily. Schedule 30 min OV for follow up joint pain in 1-2 months.

## 2015-01-08 NOTE — Assessment & Plan Note (Signed)
Improved and almost in osteopenia range in both hip and spine.  Pt intolerant of bisphosphonates, SE now to evista. She is hesitant about prolia or forteo.  Will work on ca, vit D and weight bearing exercise, recheck in 1 year.

## 2015-01-08 NOTE — Assessment & Plan Note (Signed)
Multiple sites.  Pt intolerant of NSAIDs given palpitations with diclofenac. Will have her try glucosamine and use tramadol for breakthrough pain. If issues continuing, will eval each joint individually.

## 2015-01-08 NOTE — Progress Notes (Signed)
   Subjective:    Patient ID: Michelle Gill, female    DOB: [birth year], 64 y.o.   MRN: [id number]  HPI  64 year old female present for follow up bone density, osteoporosis   At last check DEXA 4/19 there was improvement in bone density compared to 2013 in spine, stable in hip. She has been on Evista for years.  In the last 2 months evista  She had stopped Evista , as it had been causing hot flashes and muscle aches. Since then she has had resolution of those symptoms.  SE to fosamax in past. Cause throat closing up, choking. No GER.  She also stopped diclofenac given ? palpitations. Was on for knee pain. 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  "What can I do for pain"  She has been having severe toe and foot cramps, occ in thighs and hands. Ongoing in last 09/2014.  Last nml electrolytes in 07/2015. Drinking 4-5 glasses a day. She has been on HCTZ daily for HTN.      Review of Systems  Constitutional: Negative for fever and fatigue.  HENT: Negative for ear pain.   Eyes: Negative for pain.  Respiratory: Negative for chest tightness and shortness of breath.   Cardiovascular: Negative for chest pain, palpitations and leg swelling.  Gastrointestinal: Negative for abdominal pain.  Genitourinary: Negative for dysuria.       Objective:   Physical Exam  Constitutional: Vital signs are normal. She appears well-developed and well-nourished. She is cooperative.  Non-toxic appearance. She does not appear ill. No distress.  Thin appearing female in NAD.  HENT:  Head: Normocephalic.  Right Ear: Hearing, tympanic membrane, external ear and ear canal normal. Tympanic membrane is not erythematous, not retracted and not bulging.  Left Ear: Hearing, tympanic membrane, external ear and ear canal normal. Tympanic membrane is not erythematous, not retracted and not bulging.  Nose: No mucosal edema or rhinorrhea. Right sinus exhibits no maxillary sinus tenderness and no  frontal sinus tenderness. Left sinus exhibits no maxillary sinus tenderness and no frontal sinus tenderness.  Mouth/Throat: Uvula is midline, oropharynx is clear and moist and mucous membranes are normal.  Eyes: Conjunctivae, EOM and lids are normal. Pupils are equal, round, and reactive to light. Lids are everted and swept, no foreign bodies found.  Neck: Trachea normal and normal range of motion. Neck supple. Carotid bruit is not present. No thyroid mass and no thyromegaly present.  Cardiovascular: Normal rate, regular rhythm, S1 normal, S2 normal, normal heart sounds, intact distal pulses and normal pulses.  Exam reveals no gallop and no friction rub.   No murmur heard. Pulmonary/Chest: Effort normal and breath sounds normal. No tachypnea. No respiratory distress. She has no decreased breath sounds. She has no wheezes. She has no rhonchi. She has no rales.  Abdominal: Soft. Normal appearance and bowel sounds are normal. There is no tenderness.  Neurological: She is alert.  Skin: Skin is warm, dry and intact. No rash noted.  Psychiatric: Her speech is normal and behavior is normal. Judgment and thought content normal. Her mood appears not anxious. Cognition and memory are normal. She does not exhibit a depressed mood.          Assessment & Plan:

## 2015-01-09 LAB — CBC WITH DIFFERENTIAL/PLATELET
Basophils Absolute: 0.1 10*3/uL (ref 0.0–0.1)
Basophils Relative: 1.2 % (ref 0.0–3.0)
EOS PCT: 2.3 % (ref 0.0–5.0)
Eosinophils Absolute: 0.1 10*3/uL (ref 0.0–0.7)
HCT: 38.4 % (ref 36.0–46.0)
Hemoglobin: 12.6 g/dL (ref 12.0–15.0)
Lymphocytes Relative: 26.8 % (ref 12.0–46.0)
Lymphs Abs: 1.5 10*3/uL (ref 0.7–4.0)
MCHC: 32.7 g/dL (ref 30.0–36.0)
MCV: 74.7 fl — ABNORMAL LOW (ref 78.0–100.0)
MONOS PCT: 6.9 % (ref 3.0–12.0)
Monocytes Absolute: 0.4 10*3/uL (ref 0.1–1.0)
NEUTROS PCT: 62.8 % (ref 43.0–77.0)
Neutro Abs: 3.6 10*3/uL (ref 1.4–7.7)
PLATELETS: 193 10*3/uL (ref 150.0–400.0)
RBC: 5.14 Mil/uL — ABNORMAL HIGH (ref 3.87–5.11)
RDW: 13.3 % (ref 11.5–15.5)
WBC: 5.7 10*3/uL (ref 4.0–10.5)

## 2015-01-09 LAB — COMPREHENSIVE METABOLIC PANEL
ALBUMIN: 4.2 g/dL (ref 3.5–5.2)
ALK PHOS: 89 U/L (ref 39–117)
ALT: 24 U/L (ref 0–35)
AST: 24 U/L (ref 0–37)
BUN: 9 mg/dL (ref 6–23)
CHLORIDE: 102 meq/L (ref 96–112)
CO2: 31 mEq/L (ref 19–32)
Calcium: 10.2 mg/dL (ref 8.4–10.5)
Creatinine, Ser: 0.64 mg/dL (ref 0.40–1.20)
GFR: 119.49 mL/min (ref >60.00)
Glucose, Bld: 75 mg/dL (ref 70–99)
Potassium: 3.3 mEq/L — ABNORMAL LOW (ref 3.5–5.1)
Sodium: 139 mEq/L (ref 135–145)
Total Bilirubin: 0.6 mg/dL (ref 0.2–1.2)
Total Protein: 7 g/dL (ref 6.0–8.3)

## 2015-01-09 LAB — VITAMIN D 25 HYDROXY (VIT D DEFICIENCY, FRACTURES): VITD: 54.97 ng/mL (ref 30.00–100.00)

## 2015-01-10 ENCOUNTER — Telehealth: Payer: Self-pay | Admitting: Family Medicine

## 2015-01-10 NOTE — Telephone Encounter (Signed)
Let pt know that vit D is normal, no anemia, nml liver, kidney but potassiumis slightly low... This can cause cramping. Work on high potassium diet. Can take 20 mEQ of potassium daily x 3 days to get up potassium faster, but do not take longterm.

## 2015-01-10 NOTE — Telephone Encounter (Signed)
Michelle Gill notified as instructed by telephone.

## 2015-01-31 ENCOUNTER — Ambulatory Visit: Payer: BLUE CROSS/BLUE SHIELD | Admitting: Family Medicine

## 2015-02-26 ENCOUNTER — Ambulatory Visit: Payer: BLUE CROSS/BLUE SHIELD | Admitting: Family Medicine

## 2015-03-05 ENCOUNTER — Telehealth: Payer: Self-pay | Admitting: *Deleted

## 2015-03-05 ENCOUNTER — Other Ambulatory Visit: Payer: Self-pay | Admitting: Family Medicine

## 2015-03-05 NOTE — Telephone Encounter (Signed)
Received PA request from [facility] for Nasacort.  Call Help Desk and per representative, Nasacort is not a covered medication on insurance plan.  No PA required. Patient will have to pay for medication out of pocket.  Jana Half notified.  She states no problem, she will just buy it over the counter.

## 2015-03-05 NOTE — Telephone Encounter (Signed)
Pt request status of inhaler and nasal spray refill; advised already done and pt will ck with pharmacy.

## 2015-03-15 ENCOUNTER — Encounter: Payer: Self-pay | Admitting: Family Medicine

## 2015-03-15 ENCOUNTER — Ambulatory Visit (INDEPENDENT_AMBULATORY_CARE_PROVIDER_SITE_OTHER): Payer: BLUE CROSS/BLUE SHIELD | Admitting: Family Medicine

## 2015-03-15 DIAGNOSIS — M25562 Pain in left knee: Secondary | ICD-10-CM | POA: Diagnosis not present

## 2015-03-15 DIAGNOSIS — M25561 Pain in right knee: Secondary | ICD-10-CM

## 2015-03-15 DIAGNOSIS — R5382 Chronic fatigue, unspecified: Secondary | ICD-10-CM | POA: Diagnosis not present

## 2015-03-15 DIAGNOSIS — E876 Hypokalemia: Secondary | ICD-10-CM | POA: Diagnosis not present

## 2015-03-15 DIAGNOSIS — I10 Essential (primary) hypertension: Secondary | ICD-10-CM | POA: Diagnosis not present

## 2015-03-15 DIAGNOSIS — D509 Iron deficiency anemia, unspecified: Secondary | ICD-10-CM

## 2015-03-15 LAB — FERRITIN: FERRITIN: 151 ng/mL (ref 10–291)

## 2015-03-15 MED ORDER — TRIAMTERENE-HCTZ 37.5-25 MG PO TABS: 1.0000 | ORAL_TABLET | Freq: Every day | ORAL | Status: DC

## 2015-03-15 NOTE — Assessment & Plan Note (Signed)
Will re-eval today.

## 2015-03-15 NOTE — Assessment & Plan Note (Signed)
Re-eval iron stores. Pt state felt less tired when started on iron.

## 2015-03-15 NOTE — Assessment & Plan Note (Signed)
EKG nml .Labs in past normal.  Consider eval with ECHO although no sign on exam of CHF.

## 2015-03-15 NOTE — Progress Notes (Signed)
Pre visit review using our clinic review tool, if applicable. No additional management support is needed unless otherwise documented below in the visit note. 

## 2015-03-15 NOTE — Patient Instructions (Addendum)
Stop HCTZ.  Change to HCTZ/triamterene.  Stop at lab on way out.   Hypokalemia Hypokalemia means that the amount of potassium in the blood is lower than normal.Potassium is a chemical, called an electrolyte, that helps regulate the amount of fluid in the body. It also stimulates muscle contraction and helps nerves function properly.Most of the body's potassium is inside of cells, and only a very small amount is in the blood. Because the amount in the blood is so small, minor changes can be life-threatening. CAUSES  Antibiotics.  Diarrhea or vomiting.  Using laxatives too much, which can cause diarrhea.  Chronic kidney disease.  Water pills (diuretics).  Eating disorders (bulimia).  Low magnesium level.  Sweating a lot. SIGNS AND SYMPTOMS  Weakness.  Constipation.  Fatigue.  Muscle cramps.  Mental confusion.  Skipped heartbeats or irregular heartbeat (palpitations).  Tingling or numbness. DIAGNOSIS  Your health care provider can diagnose hypokalemia with blood tests. In addition to checking your potassium level, your health care provider may also check other lab tests. TREATMENT Hypokalemia can be treated with potassium supplements taken by mouth or adjustments in your current medicines. If your potassium level is very low, you may need to get potassium through a vein (IV) and be monitored in the hospital. A diet high in potassium is also helpful. Foods high in potassium are:  Nuts, such as peanuts and pistachios.  Seeds, such as sunflower seeds and pumpkin seeds.  Peas, lentils, and lima beans.  Whole grain and bran cereals and breads.  Fresh fruit and vegetables, such as apricots, avocado, bananas, cantaloupe, kiwi, oranges, tomatoes, asparagus, and potatoes.  Orange and tomato juices.  Red meats.  Fruit yogurt. HOME CARE INSTRUCTIONS  Take all medicines as prescribed by your health care provider.  Maintain a healthy diet by including nutritious food,  such as fruits, vegetables, nuts, whole grains, and lean meats.  If you are taking a laxative, be sure to follow the directions on the label. SEEK MEDICAL CARE IF:  Your weakness gets worse.  You feel your heart pounding or racing.  You are vomiting or having diarrhea.  You are diabetic and having trouble keeping your blood glucose in the normal range. SEEK IMMEDIATE MEDICAL CARE IF:  You have chest pain, shortness of breath, or dizziness.  You are vomiting or having diarrhea for more than 2 days.  You faint. MAKE SURE YOU:   Understand these instructions.  Will watch your condition.  Will get help right away if you are not doing well or get worse. Document Released: 08/24/2005 Document Revised: 06/14/2013 Document Reviewed: 02/24/2013 [facility] Patient Information 2015 [place], [place]. This information is not intended to replace advice given to you by your health care provider. Make sure you discuss any questions you have with your health care provider.

## 2015-03-15 NOTE — Progress Notes (Signed)
   Subjective:    Patient ID: Michelle Gill, female    DOB: 05/20/1951, 64 y.o.   MRN: [id number]  HPI  64 year old female presents for 6 month follow up.  At last OV wish was having significant joint pain from arthritis in hips, back and hands. Started on tramadol at last OV for pain.  She reports  Tramadol helps with pain but puts her to sleep. Uses rarely.   She is still fatigued overall. She is very exhausted at around 2 each day.  Sleeping better now, about 6-7 hours a night, but it does not help.  She is taking 325 mg daily of iron.  Stress is moderately controlled, better now. She has come to terms with her mothers illness.  Leg cramping: Recent potassium was slightly low. Tried to take potassium but was too big.  May want to consider changing to maxide to save potassium. She report SE to other BP meds in past.   Hypertension:   Well controlled on HCTZ. BP Readings from Last 3 Encounters:  03/15/15 110/60  01/08/15 120/64  08/24/14 120/70  Using medication without problems or lightheadedness:  None Chest pain with exertion: None Edema:None Short of breath:None, occ feels like she cannot get her breath. Average home BPs: 120/70 Other issues:       Review of Systems  Constitutional: Negative for fever and fatigue.  HENT: Negative for ear pain.   Eyes: Negative for pain.  Respiratory: Negative for chest tightness and shortness of breath.   Cardiovascular: Negative for chest pain, palpitations and leg swelling.  Gastrointestinal: Negative for abdominal pain.  Genitourinary: Negative for dysuria.       Objective:   Physical Exam  Constitutional: Vital signs are normal. She appears well-developed and well-nourished. She is cooperative.  Non-toxic appearance. She does not appear ill. No distress.  HENT:  Head: Normocephalic.  Right Ear: Hearing, tympanic membrane, external ear and ear canal normal. Tympanic membrane is not erythematous, not retracted and not  bulging.  Left Ear: Hearing, tympanic membrane, external ear and ear canal normal. Tympanic membrane is not erythematous, not retracted and not bulging.  Nose: No mucosal edema or rhinorrhea. Right sinus exhibits no maxillary sinus tenderness and no frontal sinus tenderness. Left sinus exhibits no maxillary sinus tenderness and no frontal sinus tenderness.  Mouth/Throat: Uvula is midline, oropharynx is clear and moist and mucous membranes are normal.  Eyes: Conjunctivae, EOM and lids are normal. Pupils are equal, round, and reactive to light. Lids are everted and swept, no foreign bodies found.  Neck: Trachea normal and normal range of motion. Neck supple. Carotid bruit is not present. No thyroid mass and no thyromegaly present.  Cardiovascular: Normal rate, regular rhythm, S1 normal, S2 normal, normal heart sounds, intact distal pulses and normal pulses.  Exam reveals no gallop and no friction rub.   No murmur heard. Pulmonary/Chest: Effort normal and breath sounds normal. No tachypnea. No respiratory distress. She has no decreased breath sounds. She has no wheezes. She has no rhonchi. She has no rales.  Abdominal: Soft. Normal appearance and bowel sounds are normal. There is no tenderness.  Neurological: She is alert.  Skin: Skin is warm, dry and intact. No rash noted.  Psychiatric: Her speech is normal and behavior is normal. Judgment and thought content normal. Her mood appears not anxious. Cognition and memory are normal. She does not exhibit a depressed mood.          Assessment & Plan:

## 2015-03-15 NOTE — Assessment & Plan Note (Signed)
Will change to maxide to preserve potassium.

## 2015-03-20 LAB — BASIC METABOLIC PANEL
BUN: 11 mg/dL (ref 6–23)
CO2: 30 mEq/L (ref 19–32)
Calcium: 9.2 mg/dL (ref 8.4–10.5)
Chloride: 105 mEq/L (ref 96–112)
Creat: 0.55 mg/dL (ref 0.50–1.10)
Glucose, Bld: 92 mg/dL (ref 70–99)
POTASSIUM: 3.6 meq/L (ref 3.5–5.3)
Sodium: 141 mEq/L (ref 135–145)

## 2015-03-20 LAB — IRON: Iron: 103 ug/dL (ref 42–145)

## 2015-03-20 LAB — IBC PANEL
%SAT: 38 % (ref 20–55)
TIBC: 274 ug/dL (ref 250–470)
UIBC: 171 ug/dL (ref 125–400)

## 2015-03-23 ENCOUNTER — Other Ambulatory Visit: Payer: Self-pay | Admitting: Family Medicine

## 2015-03-26 ENCOUNTER — Other Ambulatory Visit: Payer: Self-pay | Admitting: Family Medicine

## 2015-05-07 ENCOUNTER — Other Ambulatory Visit: Payer: Self-pay | Admitting: Family Medicine

## 2015-05-07 NOTE — Telephone Encounter (Signed)
Pt left v/m requesting refill HCTZ; the triamterene HCTZ causes pt to feel SOB and pt wants to stop that med and restart HCTZ. [facility]. Pt request cb.

## 2015-05-08 ENCOUNTER — Other Ambulatory Visit: Payer: Self-pay | Admitting: Family Medicine

## 2015-05-08 NOTE — Telephone Encounter (Signed)
Okay to return to HCTZ. Refill if needed.

## 2015-06-03 ENCOUNTER — Other Ambulatory Visit: Payer: Self-pay | Admitting: Family Medicine

## 2015-06-04 NOTE — Telephone Encounter (Signed)
Alprazolam called into [facility]. 

## 2015-06-04 NOTE — Telephone Encounter (Signed)
Last office visit 03/15/2015.  Last refilled 06/15/2014 for #30 with no refills.  Ok to refill?

## 2015-06-14 ENCOUNTER — Emergency Department (HOSPITAL_COMMUNITY)
Admission: EM | Admit: 2015-06-14 | Discharge: 2015-06-15 | Disposition: A | Discharge status: Home / Self Care | Payer: BLUE CROSS/BLUE SHIELD | Attending: Emergency Medicine | Admitting: Emergency Medicine

## 2015-06-14 ENCOUNTER — Encounter (HOSPITAL_COMMUNITY): Payer: Self-pay | Admitting: Emergency Medicine

## 2015-06-14 DIAGNOSIS — Z8701 Personal history of pneumonia (recurrent): Secondary | ICD-10-CM | POA: Insufficient documentation

## 2015-06-14 DIAGNOSIS — M7989 Other specified soft tissue disorders: Secondary | ICD-10-CM | POA: Diagnosis not present

## 2015-06-14 DIAGNOSIS — F419 Anxiety disorder, unspecified: Secondary | ICD-10-CM | POA: Diagnosis not present

## 2015-06-14 DIAGNOSIS — D649 Anemia, unspecified: Secondary | ICD-10-CM | POA: Insufficient documentation

## 2015-06-14 DIAGNOSIS — Z79899 Other long term (current) drug therapy: Secondary | ICD-10-CM | POA: Insufficient documentation

## 2015-06-14 DIAGNOSIS — I10 Essential (primary) hypertension: Secondary | ICD-10-CM | POA: Insufficient documentation

## 2015-06-14 DIAGNOSIS — J45909 Unspecified asthma, uncomplicated: Secondary | ICD-10-CM | POA: Insufficient documentation

## 2015-06-14 LAB — I-STAT CHEM 8, ED
BUN: 11 mg/dL (ref 6–20)
CALCIUM ION: 1.29 mmol/L (ref 1.13–1.30)
CREATININE: 0.6 mg/dL (ref 0.44–1.00)
Chloride: 102 mmol/L (ref 101–111)
GLUCOSE: 105 mg/dL — AB (ref 65–99)
HCT: 42 % (ref 36.0–46.0)
HEMOGLOBIN: 14.3 g/dL (ref 12.0–15.0)
POTASSIUM: 3.7 mmol/L (ref 3.5–5.1)
Sodium: 139 mmol/L (ref 135–145)
TCO2: 27 mmol/L (ref 0–100)

## 2015-06-14 NOTE — ED Notes (Signed)
Pt reports to ER from home c/o of swelling to back of rt calf and lateral rt ankle that was noted 45 minutes ago; pt denies any pain but states there is some numbness/tingling present; pt states she was switched from hydrochlorithiazide to triamterene 2 months ago due to low potassium. NAD noted; pt ambulatory, alert, and oriented

## 2015-06-15 ENCOUNTER — Ambulatory Visit (HOSPITAL_BASED_OUTPATIENT_CLINIC_OR_DEPARTMENT_OTHER)
Admission: RE | Admit: 2015-06-15 | Discharge: 2015-06-15 | Disposition: A | Discharge status: Home / Self Care | Payer: BLUE CROSS/BLUE SHIELD | Source: Ambulatory Visit | Attending: Emergency Medicine | Admitting: Emergency Medicine

## 2015-06-15 DIAGNOSIS — M79609 Pain in unspecified limb: Secondary | ICD-10-CM

## 2015-06-15 DIAGNOSIS — M7989 Other specified soft tissue disorders: Secondary | ICD-10-CM | POA: Diagnosis not present

## 2015-06-15 LAB — CBC WITH DIFFERENTIAL/PLATELET
Basophils Absolute: 0 K/uL (ref 0.0–0.1)
Basophils Relative: 1 %
Eosinophils Absolute: 0.3 K/uL (ref 0.0–0.7)
Eosinophils Relative: 4 %
HCT: 38.4 % (ref 36.0–46.0)
Hemoglobin: 12.3 g/dL (ref 12.0–15.0)
Lymphocytes Relative: 40 %
Lymphs Abs: 2.3 K/uL (ref 0.7–4.0)
MCH: 24.1 pg — ABNORMAL LOW (ref 26.0–34.0)
MCHC: 32 g/dL (ref 30.0–36.0)
MCV: 75.1 fL — ABNORMAL LOW (ref 78.0–100.0)
Monocytes Absolute: 0.4 K/uL (ref 0.1–1.0)
Monocytes Relative: 7 %
Neutro Abs: 2.7 K/uL (ref 1.7–7.7)
Neutrophils Relative %: 48 %
Platelets: 195 K/uL (ref 150–400)
RBC: 5.11 MIL/uL (ref 3.87–5.11)
RDW: 13.4 % (ref 11.5–15.5)
WBC: 5.7 K/uL (ref 4.0–10.5)

## 2015-06-15 MED ORDER — ENOXAPARIN SODIUM 60 MG/0.6ML ~~LOC~~ SOLN
1.0000 mg/kg | Freq: Once | SUBCUTANEOUS | Status: AC
  Administered 2015-06-15: 55 mg via SUBCUTANEOUS
  Filled 2015-06-15 (×2): qty 0.6

## 2015-06-15 NOTE — Discharge Instructions (Signed)
IMPORTANT PATIENT INSTRUCTIONS:  ° °You have been scheduled for an Outpatient Vascular Study at [facility].   ° °If tomorrow is a Saturday or Sunday, please go to the [facility] registration desk at 8 AM tomorrow morning and tell them you are therefore a vascular study. ° °If tomorrow is a weekday (Monday - Friday), please go to the [facility]mitting Department at 8 AM and tell them you are therefore a vascular study ° ° °

## 2015-06-15 NOTE — ED Provider Notes (Signed)
CSN: [id number]     Arrival date & time 06/14/15  2238 History   First MD Initiated Contact with Patient 06/15/15 0132     Chief Complaint  Patient presents with  . Leg Swelling     (Consider location/radiation/quality/duration/timing/severity/associated sxs/prior Treatment) HPI Comments: Presents with complaint of pain and swelling of right lower extremity. No trauma or injury. No discoloration. No calf pain or thigh pain. No history of clots. She denies fever, SOB or CP.  The history is provided by the patient and the spouse. No language interpreter was used.    Past Medical History  Diagnosis Date  . Hypertension   . Asthma     exercise induced  . Anal fissure   . Anemia   . Anxiety   . Pneumonia 2006   Past Surgical History  Procedure Laterality Date  . Sinus irrigation    . 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 Vaginal hysterectomy  1986    partial   Family History  Problem Relation Age of Onset  . Colon cancer Neg Hx   . Diabetes Brother   . Arthritis Brother   . Gout Brother   . Diabetes Sister     pre diabetic  . Arthritis Sister   . Gout Sister   . Alzheimer's disease Mother   . Hyperlipidemia Mother   . Hypertension Mother   . Arthritis Mother   . Gout Mother   . Cancer Father 73    stomach cancer  . Alcohol abuse Father   . Alcohol abuse Brother    Social History  Substance Use Topics  . Smoking status: Never Smoker   . Smokeless tobacco: Never Used  . Alcohol Use: No   OB History    No data available     Review of Systems  Constitutional: Negative for fever and chills.  Respiratory: Negative.  Negative for shortness of breath.   Cardiovascular: Negative.  Negative for chest pain.  Gastrointestinal: Negative.   Musculoskeletal:       See HPI.  Skin: Negative.  Negative for color change.  Neurological: Negative.  Negative for weakness and numbness.      Allergies  Morphine and related and Naproxen  Home Medications   Prior  to Admission medications   Medication Sig Start Date End Date Taking? Authorizing Provider  ALPRAZolam (XANAX) 0.5 MG tablet TAKE 1/2 TABLET BY MOUTH EVERY DAY AS NEEDED ANXIETY Patient taking differently: take 0.5 tablet by mouth daily as needed for anxiety 06/04/15  Yes Amy E Bedsole, MD  Biotin 2500 MCG CAPS Take 1 capsule by mouth daily.   Yes Historical Provider, MD  cholecalciferol (VITAMIN D) 1000 UNITS tablet Take 2,000 Units by mouth daily.    Yes Historical Provider, MD  ferrous sulfate 324 (65 FE) MG TBEC Take 1 tablet by mouth daily.   Yes Historical Provider, MD  triamcinolone (NASACORT) 55 MCG/ACT AERO nasal inhaler PLACE 2 SPRAYS IN EACH NOSTRIL ONCE A DAY FOR ALLERGIES 03/05/15  Yes Amy E Bedsole, MD  triamterene-hydrochlorothiazide (MAXZIDE-25) 37.5-25 MG per tablet Take 1 tablet by mouth daily. 03/15/15  Yes Amy Cletis [place], MD  esomeprazole (NEXIUM) 40 MG capsule Take 40 mg by mouth as needed. For acid reflux    Historical Provider, MD  hydrochlorothiazide (HYDRODIURIL) 25 MG tablet TAKE 1 TABLET BY MOUTH ONCE A DAY Patient not taking: Reported on 06/15/2015 05/08/15   Amy E Diona Browner, MD  montelukast (SINGULAIR) 10 MG  tablet TAKE 1 TABLET BY MOUTH AS NEEDED. Patient taking differently: TAKE 1 TABLET BY MOUTH AS NEEDED for allergies 06/12/13   Amy E Diona Browner, MD  traMADol (ULTRAM) 50 MG tablet Take 1 tablet (50 mg total) by mouth every 6 (six) hours as needed. Patient not taking: Reported on 06/15/2015 01/08/15   Amy E Diona Browner, MD  VENTOLIN HFA 108 (90 BASE) MCG/ACT inhaler INHALE 2 PUFFS EVERY 4 HOURS AS NEEDED 03/05/15   Amy E Bedsole, MD   BP 165/73 mmHg  Pulse 75  Temp(Src) 97.6 F (36.4 C) (Oral)  Resp 18  SpO2 100% Physical Exam  Constitutional: She is oriented to person, place, and time. She appears well-developed and well-nourished.  Neck: Normal range of motion.  Pulmonary/Chest: Effort normal.  Musculoskeletal:       Legs: Focal swelling without discoloration to right lower  extremity that is non-tender. No calf tenderness or swelling. Mild edema extends to lateral ankle. No bruising. Distal pulses are intact.   Neurological: She is alert and oriented to person, place, and time.  Skin: Skin is warm and dry.    ED Course  Procedures (including critical care time) Labs Review Labs Reviewed  CBC WITH DIFFERENTIAL/PLATELET - Abnormal; Notable for the following:    MCV 75.1 (*)    MCH 24.1 (*)    All other components within normal limits  I-STAT CHEM 8, ED - Abnormal; Notable for the following:    Glucose, Bld 105 (*)    All other components within normal limits    Imaging Review No results found. I have personally reviewed and evaluated these images and lab results as part of my medical decision-making.   EKG Interpretation None      MDM   Final diagnoses:  None    1. Lower extremity swelling  Lovenox provided, doppler scheduled for a.m. Doubt DVT given location of symptoms, however, given patient's concern will provided doppler study. Encouraged PCP follow up.    Charlann Lange, PA-C 06/15/15 0240  April Palumbo, MD 06/15/15 [phone]

## 2015-06-15 NOTE — Progress Notes (Signed)
VASCULAR LAB PRELIMINARY  PRELIMINARY  PRELIMINARY  PRELIMINARY  Right lower extremity venous duplex completed.    Preliminary report:  There is no DVT or SVT noted in the right lower extremity.   Shavaun Osterloh, RVT 06/15/2015, 8:22 AM

## 2015-07-19 ENCOUNTER — Encounter: Payer: Self-pay | Admitting: Family Medicine

## 2015-07-19 ENCOUNTER — Telehealth: Payer: Self-pay | Admitting: Family Medicine

## 2015-07-19 ENCOUNTER — Ambulatory Visit (INDEPENDENT_AMBULATORY_CARE_PROVIDER_SITE_OTHER): Payer: BLUE CROSS/BLUE SHIELD | Admitting: Family Medicine

## 2015-07-19 VITALS — BP 116/74 | HR 71 | Temp 98.6°F | Ht 63.5 in | Wt 127.8 lb

## 2015-07-19 DIAGNOSIS — K5900 Constipation, unspecified: Secondary | ICD-10-CM | POA: Diagnosis not present

## 2015-07-19 DIAGNOSIS — K5909 Other constipation: Secondary | ICD-10-CM | POA: Insufficient documentation

## 2015-07-19 MED ORDER — POLYETHYLENE GLYCOL 3350 17 GM/SCOOP PO POWD: 17.0000 g | Freq: Two times a day (BID) | ORAL | Status: DC | PRN

## 2015-07-19 NOTE — Telephone Encounter (Signed)
Pt notified as instructed and pt said she had already tried Fleets enema on 07/14/15 and 07/17/15 without any results, water did come out but no BM. Pt concerned and request cb. Just as FYI; [facility] does have available appts this afternoon.

## 2015-07-19 NOTE — Telephone Encounter (Signed)
Michelle Gill DOB: 08/18/1951 Initial Comment Caller states, has a pinched nerve, was given Tylenol w/ Codeine for pain, she has not had a BM since she took 1 dose on Friday and 1 dose on Saturday. She has tried several things to have a BM since then, without result. Nurse Assessment Nurse: Vallery Sa, RN, Cathy Date/Time (Eastern Time): 07/19/2015 8:40:50 AM Confirm and document reason for call. If symptomatic, describe symptoms. ---Caller states her last bowel movement was 5 days ago. No fever, vomiting or blood in stool. Has the patient traveled out of the country within the last 30 days? ---No Does the patient have any new or worsening symptoms? ---Yes Will a triage be completed? ---Yes Related visit to physician within the last 2 weeks? ---Yes Does the PT have any chronic conditions? (i.e. diabetes, asthma, etc.) ---Yes List chronic conditions. ---High Blood Pressure, Shoulder problems, Allergies Guidelines Guideline Title Affirmed Question Affirmed Notes Constipation Last bowel movement (BM) > 4 days ago Final Disposition User See Physician within [place], RN, [employer] Referrals REFERRED TO PCP OFFICE Disagree/Comply: Leta Baptist

## 2015-07-19 NOTE — Telephone Encounter (Signed)
Can we call and see what she has tried? I hate for her to go through the weekend uncomforatble

## 2015-07-19 NOTE — Telephone Encounter (Signed)
I would like her to try a Fleets enema, which would provide her with the most immediate relief, then she can start Mirilax daily.

## 2015-07-19 NOTE — Assessment & Plan Note (Signed)
Patient with constipation after use of Tylenol with codeine. Positive bowel sounds on exam. No evidence of obstruction. Treating with aggressive MiraLAX, and continued Colace and senna. Advise use repeat enemas if needed. I anticipate that she's could have a bowel movement in the next few days with the above treatment. Follow-up as needed.

## 2015-07-19 NOTE — Progress Notes (Signed)
Pre visit review using our clinic review tool, if applicable. No additional management support is needed unless otherwise documented below in the visit note. 

## 2015-07-19 NOTE — Telephone Encounter (Signed)
Spoke with Webb Silversmith NP and was advised pt needs to be seen today if no results from fleets enema x 2.pt scheduled appt 07/19/15 at 2 PM with Dr Marsa Aris at [facility]. Pt voiced understanding.

## 2015-07-19 NOTE — Patient Instructions (Signed)
Use the miralax 2 times daily. Increase in increments of 17 g until you have a BM.  Continue the senna and Colace.  He still does not have a bowel movement in the next few days you can repeat an enema: Try another fleets or mineral oil enema or soapsuds enema. You can get these over-the-counter.  Call if you worsen or fail to improve.  Take care  Dr. Lacinda Axon

## 2015-07-19 NOTE — Telephone Encounter (Signed)
Pt has not had BM in one week; taking colace 100 mg daily, senokot ? Mg two pills daily and probiotics daily and drinking prune juice without results. [facility]. Pt request cb.

## 2015-07-19 NOTE — Progress Notes (Signed)
Subjective:  Patient ID: Michelle Gill, female    DOB: 1950-11-28  Age: 64 y.o. MRN: [id number]  CC: Constipation  HPI:  64 year old female presents for an acute visit with complaints of constipation.  Patient reports that last week she took some Tylenol with Codeine that was prescribed by orthopedist and she subsequently developed constipation. She states that she has not had a bowel movement since last Thursday. She denies any abdominal pain. She does report some mild bloating. She states she is passing gas. She has tried Colace, senna, and 2 fleets enemas without success. No known exacerbating factors.   Of note, she has not had any abdominal surgeries.  Social Hx   Social History   Social History  . Marital Status: Married    Spouse Name: N/A  . Number of Children: 1  . Years of Education: N/A   Occupational History  . retired    Social History Main Topics  . Smoking status: Never Smoker   . Smokeless tobacco: Never Used  . Alcohol Use: No  . Drug Use: No  . Sexual Activity:    Partners: Female   Other Topics Concern  . None   Social History Narrative   Married, one daughter age 18         Review of Systems  Constitutional: Negative.   Gastrointestinal: Positive for constipation. Negative for abdominal pain.   Objective:  BP 116/74 mmHg  Pulse 71  Temp(Src) 98.6 F (37 C) (Oral)  Ht 5' 3.5" (1.613 m)  Wt 127 lb 12 oz (57.947 kg)  BMI 22.27 kg/m2  SpO2 98%  BP/Weight 07/19/2015 [id number]  Systolic BP [id number]  Diastolic BP 74 68 60  Wt. (Lbs) 127.75 120 124.5  BMI 22.27 19.66 21.71   Physical Exam  Constitutional: She appears well-developed. No distress.  Cardiovascular: Normal rate and regular rhythm.   Pulmonary/Chest: Effort normal and breath sounds normal.  Abdominal: Soft. She exhibits no distension. There is no tenderness. There is no rebound and no guarding.  Hyperactive bowel sounds.  Neurological: She is alert.    Psychiatric: She has a normal mood and affect.  Vitals reviewed.  Lab Results  Component Value Date   WBC 5.7 06/14/2015   HGB 14.3 06/14/2015   HCT 42.0 06/14/2015   PLT 195 06/14/2015   GLUCOSE 105* 06/14/2015   CHOL 238* 08/17/2014   TRIG 55.0 08/17/2014   HDL 101.70 08/17/2014   LDLDIRECT 116.0 05/24/2013   LDLCALC 125* 08/17/2014   ALT 24 01/08/2015   AST 24 01/08/2015   NA 139 06/14/2015   K 3.7 06/14/2015   CL 102 06/14/2015   CREATININE 0.60 06/14/2015   BUN 11 06/14/2015   CO2 30 03/15/2015   TSH 1.09 07/24/2014   MICROALBUR 0.2 07/24/2014    Assessment & Plan:   Problem List Items Addressed This Visit    Constipation - Primary    Patient with constipation after use of Tylenol with codeine. Positive bowel sounds on exam. No evidence of obstruction. Treating with aggressive MiraLAX, and continued Colace and senna. Advise use repeat enemas if needed. I anticipate that she's could have a bowel movement in the next few days with the above treatment. Follow-up as needed.         Meds ordered this encounter  Medications  . polyethylene glycol powder (GLYCOLAX/MIRALAX) powder    Sig: Take 17 g by mouth 2 (two) times daily as needed.    Dispense:  3350 g  Refill:  1    Follow-up: PRN  Thersa Salt, DO

## 2015-07-19 NOTE — Telephone Encounter (Signed)
Pt has appt 07/22/15 at 2 PM with Webb Silversmith NP.

## 2015-07-22 ENCOUNTER — Ambulatory Visit: Payer: Self-pay | Admitting: Internal Medicine

## 2015-08-14 ENCOUNTER — Encounter: Payer: Self-pay | Admitting: Family Medicine

## 2015-08-14 ENCOUNTER — Ambulatory Visit (INDEPENDENT_AMBULATORY_CARE_PROVIDER_SITE_OTHER): Payer: BLUE CROSS/BLUE SHIELD | Admitting: Family Medicine

## 2015-08-14 VITALS — BP 104/62 | HR 85 | Temp 98.2°F | Ht 63.5 in | Wt 119.5 lb

## 2015-08-14 DIAGNOSIS — L249 Irritant contact dermatitis, unspecified cause: Secondary | ICD-10-CM | POA: Diagnosis not present

## 2015-08-14 DIAGNOSIS — Z23 Encounter for immunization: Secondary | ICD-10-CM

## 2015-08-14 DIAGNOSIS — I10 Essential (primary) hypertension: Secondary | ICD-10-CM

## 2015-08-14 MED ORDER — TRIAMCINOLONE ACETONIDE 0.1 % EX CREA: 1.0000 "application " | TOPICAL_CREAM | Freq: Two times a day (BID) | CUTANEOUS | Status: DC

## 2015-08-14 MED ORDER — HYDROCHLOROTHIAZIDE 25 MG PO TABS: ORAL_TABLET | ORAL | Status: DC

## 2015-08-14 NOTE — Progress Notes (Signed)
   Subjective:    Patient ID: Michelle Gill, female    DOB: 1950/11/16, 64 y.o.   MRN: [id number]  HPI   64 year old female with history of HTN presents for med management.   Hypertension: She has gone back to HCTZ 1 tab given maxide made her have dyspnea. She using additional 1/2  only as needed for swelling. Need new prescription for this dose. BP Readings from Last 3 Encounters:  08/14/15 104/62  07/19/15 116/74  06/15/15 130/68  Using medication without problems or lightheadedness: None Chest pain with exertion: None Edema:None Short of breath:None Average home BPs: 110/70 Other issues:   She is also concerned she is having a reaction to her deoderant.  Has tried unscented. She has tried many different kinds but she gets burning and tenderness.. Skin gets dark, hyperpigemented in under arms.  Ongogin x 1-2 years. She has tried applying a barrier.  Review of Systems  Constitutional: Negative for fever and fatigue.  HENT: Negative for ear pain.   Eyes: Negative for pain.  Respiratory: Negative for chest tightness and shortness of breath.   Cardiovascular: Negative for chest pain, palpitations and leg swelling.  Gastrointestinal: Negative for abdominal pain.  Genitourinary: Negative for dysuria.       Objective:   Physical Exam  Constitutional: Vital signs are normal. She appears well-developed and well-nourished. She is cooperative.  Non-toxic appearance. She does not appear ill. No distress.  HENT:  Head: Normocephalic.  Right Ear: Hearing, tympanic membrane, external ear and ear canal normal. Tympanic membrane is not erythematous, not retracted and not bulging.  Left Ear: Hearing, tympanic membrane, external ear and ear canal normal. Tympanic membrane is not erythematous, not retracted and not bulging.  Nose: No mucosal edema or rhinorrhea. Right sinus exhibits no maxillary sinus tenderness and no frontal sinus tenderness. Left sinus exhibits no maxillary sinus  tenderness and no frontal sinus tenderness.  Mouth/Throat: Uvula is midline, oropharynx is clear and moist and mucous membranes are normal.  Eyes: Conjunctivae, EOM and lids are normal. Pupils are equal, round, and reactive to light. Lids are everted and swept, no foreign bodies found.  Neck: Trachea normal and normal range of motion. Neck supple. Carotid bruit is not present. No thyroid mass and no thyromegaly present.  Cardiovascular: Normal rate, regular rhythm, S1 normal, S2 normal, normal heart sounds, intact distal pulses and normal pulses.  Exam reveals no gallop and no friction rub.   No murmur heard. Pulmonary/Chest: Effort normal and breath sounds normal. No tachypnea. No respiratory distress. She has no decreased breath sounds. She has no wheezes. She has no rhonchi. She has no rales.  Abdominal: Soft. Normal appearance and bowel sounds are normal. There is no tenderness.  Neurological: She is alert.  Skin: Skin is warm, dry and intact. No rash noted.  No rash, slightly hyperpigmented skin at axillae  Psychiatric: Her speech is normal and behavior is normal. Judgment and thought content normal. Her mood appears not anxious. Cognition and memory are normal. She does not exhibit a depressed mood.          Assessment & Plan:

## 2015-08-14 NOTE — Progress Notes (Signed)
Pre visit review using our clinic review tool, if applicable. No additional management support is needed unless otherwise documented below in the visit note. 

## 2015-08-14 NOTE — Assessment & Plan Note (Signed)
Well controlled on HCTZ daily with occ additional 1/2 tab for swelling in ankles. Refilled medication.  Intolerant of maxide.  Follow potassium.

## 2015-08-14 NOTE — Patient Instructions (Signed)
Apply topical steroid to affected area twice daily x 2 weeks, can use as needed after that time, but not continuously longterm.  Try unscented deoderant.

## 2015-08-14 NOTE — Assessment & Plan Note (Signed)
Treat with topical steroid. Try intermittant use of dye fragrance free deoderant.

## 2015-09-12 ENCOUNTER — Telehealth: Payer: Self-pay | Admitting: Family Medicine

## 2015-09-12 DIAGNOSIS — Z1159 Encounter for screening for other viral diseases: Secondary | ICD-10-CM

## 2015-09-12 DIAGNOSIS — M810 Age-related osteoporosis without current pathological fracture: Secondary | ICD-10-CM

## 2015-09-12 DIAGNOSIS — Z13220 Encounter for screening for lipoid disorders: Secondary | ICD-10-CM

## 2015-09-12 DIAGNOSIS — D509 Iron deficiency anemia, unspecified: Secondary | ICD-10-CM

## 2015-09-12 DIAGNOSIS — E559 Vitamin D deficiency, unspecified: Secondary | ICD-10-CM

## 2015-09-12 NOTE — Telephone Encounter (Signed)
-----   Message from Marchia Bond sent at 09/04/2015  2:53 PM EST ----- Regarding: cpx labs Fri 1/6, need orders. Thanks :-) Please order  future cpx labs for pt's upcoming lab appt. Thanks Aniceto Boss

## 2015-09-13 ENCOUNTER — Other Ambulatory Visit (INDEPENDENT_AMBULATORY_CARE_PROVIDER_SITE_OTHER): Payer: BLUE CROSS/BLUE SHIELD

## 2015-09-13 DIAGNOSIS — Z13220 Encounter for screening for lipoid disorders: Secondary | ICD-10-CM | POA: Diagnosis not present

## 2015-09-13 LAB — COMPREHENSIVE METABOLIC PANEL
ALBUMIN: 4.4 g/dL (ref 3.5–5.2)
ALK PHOS: 97 U/L (ref 39–117)
ALT: 18 U/L (ref 0–35)
AST: 22 U/L (ref 0–37)
BUN: 11 mg/dL (ref 6–23)
CALCIUM: 10.3 mg/dL (ref 8.4–10.5)
CO2: 32 mEq/L (ref 19–32)
Chloride: 103 mEq/L (ref 96–112)
Creatinine, Ser: 0.66 mg/dL (ref 0.40–1.20)
GFR: 115.09 mL/min (ref >60.00)
Glucose, Bld: 100 mg/dL — ABNORMAL HIGH (ref 70–99)
POTASSIUM: 3.7 meq/L (ref 3.5–5.1)
Sodium: 142 mEq/L (ref 135–145)
TOTAL PROTEIN: 6.9 g/dL (ref 6.0–8.3)
Total Bilirubin: 1.2 mg/dL (ref 0.2–1.2)

## 2015-09-13 LAB — LIPID PANEL
CHOLESTEROL: 234 mg/dL — AB (ref 0–200)
HDL: 103.1 mg/dL (ref >39.00)
LDL Cholesterol: 120 mg/dL — ABNORMAL HIGH (ref 0–99)
NonHDL: 131.13
TRIGLYCERIDES: 58 mg/dL (ref 0.0–149.0)
Total CHOL/HDL Ratio: 2
VLDL: 11.6 mg/dL (ref 0.0–40.0)

## 2015-09-19 ENCOUNTER — Encounter: Payer: Self-pay | Admitting: Family Medicine

## 2015-09-19 ENCOUNTER — Ambulatory Visit (INDEPENDENT_AMBULATORY_CARE_PROVIDER_SITE_OTHER): Payer: BLUE CROSS/BLUE SHIELD | Admitting: Family Medicine

## 2015-09-19 VITALS — BP 106/68 | HR 73 | Temp 98.1°F | Ht 63.5 in | Wt 120.8 lb

## 2015-09-19 DIAGNOSIS — F411 Generalized anxiety disorder: Secondary | ICD-10-CM

## 2015-09-19 DIAGNOSIS — M810 Age-related osteoporosis without current pathological fracture: Secondary | ICD-10-CM

## 2015-09-19 DIAGNOSIS — I10 Essential (primary) hypertension: Secondary | ICD-10-CM

## 2015-09-19 DIAGNOSIS — Z0000 Encounter for general adult medical examination without abnormal findings: Secondary | ICD-10-CM | POA: Diagnosis not present

## 2015-09-19 DIAGNOSIS — Z1159 Encounter for screening for other viral diseases: Secondary | ICD-10-CM

## 2015-09-19 NOTE — Addendum Note (Signed)
Addended byEliezer Lofts E on: 09/19/2015 02:51 PM   Modules accepted: SmartSet

## 2015-09-19 NOTE — Progress Notes (Signed)
Pre visit review using our clinic review tool, if applicable. No additional management support is needed unless otherwise documented below in the visit note. 

## 2015-09-19 NOTE — Patient Instructions (Addendum)
Stop at front desk to set up DEXA.  Work on healthy low carb, low cholesterol diet.

## 2015-09-19 NOTE — Assessment & Plan Note (Signed)
Due for re-eval this year.

## 2015-09-19 NOTE — Progress Notes (Addendum)
The patient is here for annual wellness exam and preventative care. non medicare.   She is doing well overall.   Hypertension: Well controlled on HCTZ.  BP Readings from Last 3 Encounters:  09/19/15 106/68  08/14/15 104/62  07/19/15 116/74  Using medication without problems or lightheadedness: None Chest pain with exertion:None Edema:None Short of breath:None Average home BPs: 110/760s Other issues:  She has been under a lot of stress form helping to care for her mother.  She denies depression and anxiety but is feeling somewhat overwhelmed. Rarely using alprazolam, using about every few weeks. Her mother is slowly passing a way, she has stopped eating.  Reviewed labs in detail.. Chol LDL at goal <130. Lab Results  Component Value Date   CHOL 234* 09/13/2015   HDL 103.10 09/13/2015   LDLCALC 120* 09/13/2015   LDLDIRECT 116.0 05/24/2013   TRIG 58.0 09/13/2015   CHOLHDL 2 09/13/2015  Diet: Stopped  ice cream, eating a lot of eggs for protein.  Prediabetes: improved with diet changes.  Review of Systems  Constitutional: Negative for fever and fatigue.  HENT: Negative for ear pain.  Eyes: Negative for pain.  Respiratory: Negative for chest tightness and shortness of breath.  Cardiovascular: Negative for chest pain, palpitations and leg swelling.  Gastrointestinal: Negative for abdominal pain.  Genitourinary: Negative for dysuria and pelvic pain.  Mild soreness , no mass in left breast.    Objective:  Physical Exam  Constitutional: Vital signs are normal. She appears well-developed and well-nourished. She is cooperative. Non-toxic appearance. She does not appear ill. No distress.  HENT:  Head: Normocephalic.  Right Ear: Hearing, tympanic membrane, external ear and ear canal normal.  Left Ear: Hearing, tympanic membrane, external ear and ear canal normal.  Nose: Nose normal.  Eyes: Conjunctivae, EOM and lids are normal. Pupils are equal, round,  and reactive to light. Lids are everted and swept, no foreign bodies found.  Neck: Trachea normal and normal range of motion. Neck supple. Carotid bruit is not present. No mass and no thyromegaly present.  Cardiovascular: Normal rate, regular rhythm, S1 normal, S2 normal, normal heart sounds and intact distal pulses. Exam reveals no gallop.  No murmur heard. Pulmonary/Chest: Effort normal and breath sounds normal. No respiratory distress. She has no wheezes. She has no rhonchi. She has no rales.  Abdominal: Soft. Normal appearance and bowel sounds are normal. She exhibits no distension, no fluid wave, no abdominal bruit and no mass. There is no hepatosplenomegaly. There is no tenderness. There is no rebound, no guarding and no CVA tenderness. No hernia.  Genitourinary:  DVE: not performed this year, nml breast exam. Lymphadenopathy:   She has no cervical adenopathy.   She has no axillary adenopathy.  Neurological: She is alert. She has normal strength. No cranial nerve deficit or sensory deficit.  Skin: Skin is warm, dry and intact. No rash noted.  Psychiatric: Her speech is normal and behavior is normal. Judgment normal. Her mood appears not anxious. Cognition and memory are normal. She does not exhibit a depressed mood.         Assessment & Plan:  The patient's preventative maintenance and recommended screening tests for an annual wellness exam were reviewed in full today. Brought up to date unless services declined.  Counselled on the importance of diet, exercise, and its role in overall health and mortality. The patient's FH and SH was reviewed, including their home life, tobacco status, and drug and alcohol status.    Mammogram 12/2014  nml  Colonoscopy 2010, Dr. Deatra Ina, plans repeat in 2020.  Last DEXA: 12/2014 improved and almost in osteopenia range SE to evista and SE fosamax. Nervous about forteo. Plan recheck in 2017/2018 PAP/DVE: no pap indicated partial  hystec, no new abdominal symptoms, no ovarian cancer in family. DVE every other year. Vaccines:uptodate with PNA, Tdap, zoster, Flu,prevnar  Nonsmoker  Hep C: plan in future.

## 2015-09-19 NOTE — Assessment & Plan Note (Signed)
Stable control on alprazolam rarely. 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

## 2015-09-19 NOTE — Assessment & Plan Note (Signed)
Well controlled. Continue current medication.  

## 2015-10-01 ENCOUNTER — Other Ambulatory Visit: Payer: Self-pay | Admitting: Family Medicine

## 2015-12-02 ENCOUNTER — Other Ambulatory Visit: Payer: Self-pay

## 2015-12-02 NOTE — Telephone Encounter (Signed)
Pt left v/m requesting new rx sent to [facility] for HCTZ 25 with instructions taking 1 tab daily with additional 1/2 tab daily as needed for swelling. Last refill was # 90 with instructions taking one daily (on current med list now). Pt said discussed with Dr Diona Browner at 08/14/15 appt. The office note indicated an additional 1/2 as needed for swelling.  Per v/m pt is taking daily.Please advise.

## 2015-12-03 MED ORDER — HYDROCHLOROTHIAZIDE 25 MG PO TABS: ORAL_TABLET | ORAL | Status: DC

## 2015-12-30 ENCOUNTER — Encounter: Payer: Self-pay | Admitting: Family Medicine

## 2016-01-29 ENCOUNTER — Telehealth: Payer: Self-pay | Admitting: Family Medicine

## 2016-01-29 NOTE — Telephone Encounter (Signed)
Patient Name: TENASIA TOMANEK DOB: Jul 13, 1951 Initial Comment Caller states mother was in hospice and passed away and has been having chest discomfort for 7 months. Thought it was the stress but she keep having twinges. Had EKG was normal but wants to know if she needs referral to see cardiologist. Feels tired Nurse Assessment Nurse: Ronnald Ramp, RN, Miranda Date/Time (Eastern Time): 01/29/2016 10:08:58 AM Confirm and document reason for call. If symptomatic, describe symptoms. You must click the next button to save text entered. ---Caller states she has been having twinges of pain in her chest mainly at night that last a few seconds. This has been going on for about 7 months. She is not currently having discomfort. Has the patient traveled out of the country within the last 30 days? ---Not Applicable Does the patient have any new or worsening symptoms? ---Yes Will a triage be completed? ---Yes Related visit to physician within the last 2 weeks? ---No Does the PT have any chronic conditions? (i.e. diabetes, asthma, etc.) ---Yes List chronic conditions. ---HTN Is this a behavioral health or substance abuse call? ---No Guidelines Guideline Title Affirmed Question Affirmed Notes Chest Pain [1] Intermittent chest pain from "angina" AND [2] NO increase in severity or frequency Final Disposition User See PCP When Office is Open (within 3 days) Ronnald Ramp, RN, Miranda Comments Appt scheduled for Friday with Dr. Diona Browner at 11:45a Referrals REFERRED TO PCP OFFICE Disagree/Comply: Comply

## 2016-01-30 NOTE — Telephone Encounter (Signed)
Agree with appt Fri.

## 2016-01-31 ENCOUNTER — Encounter: Payer: Self-pay | Admitting: Family Medicine

## 2016-01-31 ENCOUNTER — Ambulatory Visit (INDEPENDENT_AMBULATORY_CARE_PROVIDER_SITE_OTHER): Payer: BLUE CROSS/BLUE SHIELD | Admitting: Family Medicine

## 2016-01-31 ENCOUNTER — Ambulatory Visit: Payer: Self-pay | Admitting: Family Medicine

## 2016-01-31 VITALS — BP 108/70 | HR 86 | Temp 98.2°F | Wt 127.8 lb

## 2016-01-31 DIAGNOSIS — R0789 Other chest pain: Secondary | ICD-10-CM

## 2016-01-31 DIAGNOSIS — R079 Chest pain, unspecified: Secondary | ICD-10-CM

## 2016-01-31 NOTE — Assessment & Plan Note (Signed)
Low risk for cardiac disease. EKG NSR.  Pain is atypical.. More consitent with GERD and stress.  Take PPI daily x 2 weeks , avoid triggers.  Follow up if not improving for eval with treadmill stress test.

## 2016-01-31 NOTE — Progress Notes (Signed)
Subjective:    Patient ID: Michelle Gill, female    DOB: 06/23/1951, 65 y.o.   MRN: [id number]  Chest Pain  This is a new problem. The current episode started more than 1 month ago ( 7 months). The onset quality is sudden. The problem occurs every several days (lasts seconds). The problem has been waxing and waning. The pain is present in the lateral region. The pain is at a severity of 5/10. The pain is moderate. The quality of the pain is described as squeezing and tightness. The pain does not radiate. Associated symptoms include a cough. Pertinent negatives include no dizziness, exertional chest pressure, fever, irregular heartbeat, lower extremity edema, malaise/fatigue, near-syncope, palpitations, shortness of breath, sputum production or weakness. Associated symptoms comments: Allergies  She is having GERD few times a week, using nexium prn.. The cough's precipitants include allergen exposure. The cough is non-productive. There is no color change associated with the cough. The pain is aggravated by nothing (no relationship to food, occ atfter exercsie but  also at rest, not associated with panic attack or stress.). Risk factors include lack of exercise, post-menopausal, sedentary lifestyle and stress (Afib in sister).  Pertinent negatives for past medical history include no CAD, no CHF, no hypertension, no strokes and no TIA.  Her family medical history is significant for heart disease and hypertension.   Mother passed away    28-Oct-2015 She is sad, was hard to watch her mother pass.  Poor sleep. She feels she is dealing well.  Wt Readings from Last 3 Encounters:  01/31/16 127 lb 12 oz (57.947 kg)  09/19/15 120 lb 12 oz (54.772 kg)  08/14/15 119 lb 8 oz (54.205 kg)  Body mass index is 22.27 kg/(m^2).  2009 stress test neg ECHO nml  Social History /Family History/Past Medical History reviewed and updated if needed.   HX of GAD, anemia. Review of Systems  Constitutional: Negative  for fever and malaise/fatigue.  Respiratory: Positive for cough. Negative for sputum production and shortness of breath.   Cardiovascular: Positive for chest pain. Negative for palpitations and near-syncope.  Neurological: Negative for dizziness and weakness.       Objective:   Physical Exam  Constitutional: Vital signs are normal. She appears well-developed and well-nourished. She is cooperative.  Non-toxic appearance. She does not appear ill. No distress.  HENT:  Head: Normocephalic.  Right Ear: Hearing, tympanic membrane, external ear and ear canal normal. Tympanic membrane is not erythematous, not retracted and not bulging.  Left Ear: Hearing, tympanic membrane, external ear and ear canal normal. Tympanic membrane is not erythematous, not retracted and not bulging.  Nose: No mucosal edema or rhinorrhea. Right sinus exhibits no maxillary sinus tenderness and no frontal sinus tenderness. Left sinus exhibits no maxillary sinus tenderness and no frontal sinus tenderness.  Mouth/Throat: Uvula is midline, oropharynx is clear and moist and mucous membranes are normal.  Eyes: Conjunctivae, EOM and lids are normal. Pupils are equal, round, and reactive to light. Lids are everted and swept, no foreign bodies found.  Neck: Trachea normal and normal range of motion. Neck supple. Carotid bruit is not present. No thyroid mass and no thyromegaly present.  Cardiovascular: Normal rate, regular rhythm, S1 normal, S2 normal, normal heart sounds, intact distal pulses and normal pulses.  Exam reveals no gallop and no friction rub.   No murmur heard. Pulmonary/Chest: Effort normal and breath sounds normal. No tachypnea. No respiratory distress. She has no decreased breath sounds. She has no wheezes. She  has no rhonchi. She has no rales. Chest wall is not dull to percussion. She exhibits no mass, no tenderness and no bony tenderness.  Abdominal: Soft. Normal appearance and bowel sounds are normal. There is no  tenderness.  Neurological: She is alert.  Skin: Skin is warm, dry and intact. No rash noted.  Psychiatric: Her speech is normal and behavior is normal. Judgment and thought content normal. Her mood appears not anxious. Cognition and memory are normal. She does not exhibit a depressed mood.          Assessment & Plan:

## 2016-01-31 NOTE — Patient Instructions (Signed)
Decrease ETOH,  citris, tomatos, chocolate, peppermint, soda, caffeine, spicy foods.  Stop soda and increase water. Nexium daily x 2 weeks then as needed.  Stress reduction and relaxation. Call if chest pain is continuing  for consideration of stress test.

## 2016-01-31 NOTE — Progress Notes (Signed)
Pre visit review using our clinic review tool, if applicable. No additional management support is needed unless otherwise documented below in the visit note. 

## 2016-03-09 ENCOUNTER — Encounter: Payer: Self-pay | Admitting: Family Medicine

## 2016-03-09 ENCOUNTER — Ambulatory Visit (INDEPENDENT_AMBULATORY_CARE_PROVIDER_SITE_OTHER): Payer: BLUE CROSS/BLUE SHIELD | Admitting: Family Medicine

## 2016-03-09 VITALS — BP 118/74 | HR 70 | Temp 98.3°F | Ht 63.5 in | Wt 126.5 lb

## 2016-03-09 DIAGNOSIS — M19042 Primary osteoarthritis, left hand: Secondary | ICD-10-CM | POA: Diagnosis not present

## 2016-03-09 DIAGNOSIS — M19041 Primary osteoarthritis, right hand: Secondary | ICD-10-CM

## 2016-03-09 MED ORDER — PREDNISONE 20 MG PO TABS: ORAL_TABLET | ORAL | Status: DC

## 2016-03-09 NOTE — Patient Instructions (Addendum)
Tart cherry extract (can buy on amazon)  Curcumin

## 2016-03-09 NOTE — Progress Notes (Signed)
Dr. Frederico Hamman T. Lev Cervone, MD, [facility] [place], [address] Phone: [phone] Fax: [phone]  03/09/2016  Patient: Michelle Gill, MRN: [id number], DOB: 26-Dec-1950, 65 y.o.  Primary Physician:  Eliezer Lofts, MD   Chief Complaint  Patient presents with  . Hand Pain    Right   Subjective:   Michelle Gill is a 65 y.o. very pleasant female patient who presents with the following:  R hand and has some OA. For the last 3-4 days it has been acting up and having some warmth. Taken some Alleve and meloxicam. Did not help.  She has not had any kind of trauma or accident. Her pain is primarily in the DIP and PIP joints.  Past Medical History, Surgical History, Social History, Family History, Problem List, Medications, and Allergies have been reviewed and updated if relevant.  Patient Active Problem List   Diagnosis Date Noted  . Atypical chest pain 01/31/2016  . Irritant contact dermatitis 08/14/2015  . Constipation 07/19/2015  . Fatigue 07/24/2014  . Family history of kidney disease in brother 07/24/2014  . Vitamin D deficiency 07/24/2014  . Bilateral low back pain without sciatica 07/24/2014  . Generalized anxiety disorder 06/15/2014  . Bilateral knee pain 03/23/2014  . Allergic rhinitis 04/25/2013  . Exercise-induced asthma 04/25/2013  . Osteoarthritis 04/25/2013  . Osteoporosis 04/25/2013  . Cervical disc disorder with radiculopathy of cervical region 04/25/2013  . Benign essential HTN 04/25/2013  . Photosensitivity 04/25/2013  . Dysphagia, unspecified(787.20) 03/30/2013  . Anemia, iron deficiency 03/30/2013    Past Medical History  Diagnosis Date  . Hypertension   . Asthma     exercise induced  . Anal fissure   . Anemia   . Anxiety   . Pneumonia 2006    Past Surgical History  Procedure Laterality Date  . Sinus irrigation    . 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 Vaginal hysterectomy   1986    partial    Social History   Social History  . Marital Status: Married    Spouse Name: N/A  . Number of Children: 1  . Years of Education: N/A   Occupational History  . retired    Social History Main Topics  . Smoking status: Never Smoker   . Smokeless tobacco: Never Used  . Alcohol Use: No  . Drug Use: No  . Sexual Activity:    Partners: Female   Other Topics Concern  . Not on file   Social History Narrative   Married, one daughter age 2          Family History  Problem Relation Age of Onset  . Colon cancer Neg Hx   . Diabetes Brother   . Arthritis Brother   . Gout Brother   . Diabetes Sister     pre diabetic  . Arthritis Sister   . Gout Sister   . Alzheimer's disease Mother   . Hyperlipidemia Mother   . Hypertension Mother   . Arthritis Mother   . Gout Mother   . Cancer Father 67    stomach cancer  . Alcohol abuse Father   . Alcohol abuse Brother     Allergies  Allergen Reactions  . Morphine And Related     Felt like she was going to explode, heart raced  . Naproxen Swelling    REACTION: Finger tips swell and turn black!!!    Medication  list reviewed and updated in full in [place].  GEN: No fevers, chills. Nontoxic. Primarily MSK c/o today. MSK: Detailed in the HPI GI: tolerating PO intake without difficulty Neuro: No numbness, parasthesias, or tingling associated. Otherwise the pertinent positives of the ROS are noted above.   Objective:   BP 118/74 mmHg  Pulse 70  Temp(Src) 98.3 F (36.8 C) (Oral)  Ht 5' 3.5" (1.613 m)  Wt 126 lb 8 oz (57.38 kg)  BMI 22.05 kg/m2   GEN: WDWN, NAD, Non-toxic, Alert & Oriented x 3 HEENT: Atraumatic, Normocephalic.  Ears and Nose: No external deformity. EXTR: No clubbing/cyanosis/edema NEURO: Normal gait.  PSYCH: Normally interactive. Conversant. Not depressed or anxious appearing.  Calm demeanor.   R hand Ecchymosis or edema: neg ROM wrist/hand/digits: full  Carpals, MCP's,  digits: TTP at PIP, DIP > MCP joints Distal Ulna and Radius: NT Ecchymosis or edema: neg No instability Cysts/nodules: neg Digit triggering: neg Finkelstein's test: neg Snuffbox tenderness: neg Scaphoid tubercle: NT Resisted supination: NT Full composite fist, no malrotation Grip, all digits: 5/5 str No tenosynovitis Axial load test: neg Phalen's: neg Tinel's: neg Atrophy: neg  Hand sensation: intact   Radiology: No results found.  Assessment and Plan:   Primary osteoarthritis of both hands  OA exacerbation of the right hand, multiple joints. Failure of NSAIDs and Tylenol alone.  Pulse of steroids. Trial of supplements if desired.  Follow-up: No Follow-up on file.  New Prescriptions   PREDNISONE (DELTASONE) 20 MG TABLET    2 tabs for 5 days, then 1 tab for 3 days   Patient Instructions  Tart cherry extract (can buy on [place]  Curcumin     Signed,  Michelle Abbett T. Tollie Canada, MD   Patient's Medications  New Prescriptions   PREDNISONE (DELTASONE) 20 MG TABLET    2 tabs for 5 days, then 1 tab for 3 days  Previous Medications   BIOTIN 2500 MCG CAPS    Take 1 capsule by mouth daily.   CETIRIZINE-PSEUDOEPHEDRINE (ZYRTEC-D) 5-120 MG TABLET    Take by mouth.   CHLORHEXIDINE (PERIDEX) 0.12 % SOLUTION    SWISH AND SPIT WITH 15 MILLILITERS BY MOUTH TWICE A DAY   CHOLECALCIFEROL (VITAMIN D) 1000 UNITS TABLET    Take 2,000 Units by mouth daily.    ESOMEPRAZOLE (NEXIUM) 40 MG CAPSULE    Take 40 mg by mouth as needed. For acid reflux   FERROUS SULFATE 324 (65 FE) MG TBEC    Take 1 tablet by mouth daily.   HYDROCHLOROTHIAZIDE (HYDRODIURIL) 25 MG TABLET    1 tab daily with additional 1/2 tab daily as needed for swelling.   TRIAMCINOLONE (NASACORT) 55 MCG/ACT AERO NASAL INHALER    PLACE 2 SPRAYS IN EACH NOSTRIL ONCE A DAY FOR ALLERGIES   VENTOLIN HFA 108 (90 BASE) MCG/ACT INHALER    INHALE 2 PUFFS EVERY 4 HOURS AS NEEDED  Modified Medications   No medications on file    Discontinued Medications   No medications on file

## 2016-03-09 NOTE — Progress Notes (Signed)
Pre visit review using our clinic review tool, if applicable. No additional management support is needed unless otherwise documented below in the visit note. 

## 2016-04-02 ENCOUNTER — Telehealth: Payer: Self-pay | Admitting: Family Medicine

## 2016-04-02 ENCOUNTER — Ambulatory Visit (INDEPENDENT_AMBULATORY_CARE_PROVIDER_SITE_OTHER): Payer: BLUE CROSS/BLUE SHIELD | Admitting: Family Medicine

## 2016-04-02 VITALS — BP 130/80 | HR 72 | Temp 98.5°F | Ht 63.5 in | Wt 130.0 lb

## 2016-04-02 DIAGNOSIS — R220 Localized swelling, mass and lump, head: Secondary | ICD-10-CM
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
## 2016-04-02 NOTE — Telephone Encounter (Signed)
Add pt on this afternoon please.

## 2016-04-02 NOTE — Telephone Encounter (Signed)
Patient was seen this morning by Dr. Sarajane Jews.  No need to add patient to her scheduled this afternoon per Dr. Diona Browner.

## 2016-04-02 NOTE — Telephone Encounter (Signed)
Patient Name: Michelle Gill  DOB: [birth year]    Initial Comment Caller states her face is swelled, lips are swelled. Eyes swelled shut. It's in her throat, some trouble breathing.   Nurse Assessment  Nurse: Raphael Gibney, RN, Vanita Ingles Date/Time (Eastern Time): 04/02/2016 8:26:14 AM  Confirm and document reason for call. If symptomatic, describe symptoms. You must click the next button to save text entered. ---Caller states she has facial swelling, eyes are swollen and her lips are swollen. She has asthma. No trouble swallowing. She can get a deep breath.  Has the patient traveled out of the country within the last 30 days? ---Not Applicable  Does the patient have any new or worsening symptoms? ---Yes  Will a triage be completed? ---Yes  Related visit to physician within the last 2 weeks? ---No  Does the PT have any chronic conditions? (i.e. diabetes, asthma, etc.) ---Yes  List chronic conditions. ---asthma  Is this a behavioral health or substance abuse call? ---No     Guidelines    Guideline Title Affirmed Question Affirmed Notes  Face Swelling SEVERE swelling of the entire face    Final Disposition User   See Physician within 4 Hours (or PCP triage) Raphael Gibney, RN, Vera    Comments  No appts available at [facility]. appt scheduled for 10:30 am 04/02/16 with Dr. Alysia Penna at [place].   Referrals  REFERRED TO PCP OFFICE   Disagree/Comply: Comply

## 2016-04-02 NOTE — Progress Notes (Signed)
Pre visit review using our clinic review tool, if applicable. No additional management support is needed unless otherwise documented below in the visit note. 

## 2016-04-02 NOTE — Telephone Encounter (Signed)
I called patient to let her know that Dr. Sarajane Jews wants her to go to the emergency room.  Patient refused saying that her symptoms do not merit a ER visit.  She said that the swelling began on Tuesday and that her eyes are puffy and top lip is swollen.  She states that she is breathing just fine and that her throat is not swollen.  Patient states that she thinks the reaction was caused by some new carpet.  I encouraged pt to go to ER but patient states "I know my body and I don't need to go to the ER".

## 2016-04-06 ENCOUNTER — Telehealth: Payer: Self-pay | Admitting: Family Medicine

## 2016-04-06 NOTE — Telephone Encounter (Signed)
If better continue benadryl or no-ndrwosy antihistamine like zyrtec.

## 2016-04-06 NOTE — Telephone Encounter (Signed)
Patient saw Dr Sarajane Jews last Thursday b/c she couldn't get an appt with [place]. She woke up last Tues to some facial swelling, and Wed it was worse and Thursday it was really bad, eyes were almost shut and some lip swelling also. Dr Sarajane Jews told her to take 25mg  Benedryl twice a day for 5 days. Today is the fifth day and she is better but she still has whelps on face and lip still has some swelling. She wants to know if she should continue the Benedryl? She is going away tomorrow thru Saturday. She does want an Allergy Referral but I did tell her that Allergists dont want to see patients for about 6 weeks after having a reaction because they have to be off any antihistamines 5 days before they see Allergist and they want to be sure whatever is was is out of their system.

## 2016-04-07 ENCOUNTER — Other Ambulatory Visit: Payer: Self-pay | Admitting: Family Medicine

## 2016-04-07 DIAGNOSIS — R220 Localized swelling, mass and lump, head: Secondary | ICD-10-CM

## 2016-04-07 NOTE — Progress Notes (Signed)
I put in allergy referral given pt request, but I do not think this is necessary if this is the first allergic response and it has improved.  Still if she wants to have allergy testing or seeing allergist would make her feel better abut it...fine.

## 2016-04-08 NOTE — Telephone Encounter (Signed)
Called patient back and LMOM to continue the Benedryl or Zyrtec and if she wants to be referred to an Allergist the Referral is in place so please just call us directly to have [place] schedule it.

## 2016-04-14 ENCOUNTER — Encounter: Payer: Self-pay | Admitting: Family Medicine

## 2016-04-14 ENCOUNTER — Ambulatory Visit (INDEPENDENT_AMBULATORY_CARE_PROVIDER_SITE_OTHER): Payer: BLUE CROSS/BLUE SHIELD | Admitting: Family Medicine

## 2016-04-14 DIAGNOSIS — R609 Edema, unspecified: Secondary | ICD-10-CM | POA: Diagnosis not present

## 2016-04-14 DIAGNOSIS — R600 Localized edema: Secondary | ICD-10-CM | POA: Insufficient documentation

## 2016-04-14 NOTE — Patient Instructions (Signed)
Keep working on exercise and low salt diet.  Continue HCTZ daily.

## 2016-04-14 NOTE — Assessment & Plan Note (Signed)
Due to inactivity on car trip, heat, excessive salt intake.  Improved on low salt diet, continue HCTZ and keep up exercise.

## 2016-04-14 NOTE — Progress Notes (Signed)
   Subjective:    Patient ID: Michelle Gill, female    DOB: [birth year], 65 y.o.   MRN: [id number]  HPI  65 year old female with history of HTN presents after being seen in last month with facial and lip swelling, unknown cause. Saw Dr. Sarajane Jews in [place] on 04/02/2106. Dx with allergic reaction. Started on benadryl. Has allergy referral. Only exposure was ? [employer]  Swelling improved in next several day.  Traveled to [place] in car. Peripheral edema bilaterally. Lightheaded, tired.  She was also seen at  [facility]. EKG, blood draw:  Hg 12.6, Cr 0.7, AST, ALT , electrolytes nml. 145/88 HR 105   BP Readings from Last 3 Encounters:  04/14/16 112/78  04/02/16 130/80  03/09/16 118/74    She has not been eating well .Marland Kitchen Eating a lot of sodium in ramen noodles, potatos chips. Now down to 2000 mg or less each day.  Wt Readings from Last 3 Encounters:  04/14/16 122 lb (55.3 kg)  04/02/16 130 lb (59 kg)  03/09/16 126 lb 8 oz (57.4 kg)    Now swelling resolved.  She has had  Some improvement in dizziness, slightly out of breath.  She is using hydrochlorothiazide.   Review of Systems  Constitutional: Negative for fatigue and fever.  HENT: Negative for ear pain.   Eyes: Negative for pain.  Respiratory: Negative for chest tightness and shortness of breath.   Cardiovascular: Negative for chest pain, palpitations and leg swelling.  Gastrointestinal: Negative for abdominal pain.  Genitourinary: Negative for dysuria.       Objective:   Physical Exam  Constitutional: Vital signs are normal. She appears well-developed and well-nourished. She is cooperative.  Non-toxic appearance. She does not appear ill. No distress.  HENT:  Head: Normocephalic.  Right Ear: Hearing, tympanic membrane, external ear and ear canal normal. Tympanic membrane is not erythematous, not retracted and not bulging.  Left Ear: Hearing, tympanic membrane, external ear and ear canal normal. Tympanic membrane is not  erythematous, not retracted and not bulging.  Nose: No mucosal edema or rhinorrhea. Right sinus exhibits no maxillary sinus tenderness and no frontal sinus tenderness. Left sinus exhibits no maxillary sinus tenderness and no frontal sinus tenderness.  Mouth/Throat: Uvula is midline, oropharynx is clear and moist and mucous membranes are normal.  Eyes: Conjunctivae, EOM and lids are normal. Pupils are equal, round, and reactive to light. Lids are everted and swept, no foreign bodies found.  Neck: Trachea normal and normal range of motion. Neck supple. Carotid bruit is not present. No thyroid mass and no thyromegaly present.  Cardiovascular: Normal rate, regular rhythm, S1 normal, S2 normal, normal heart sounds, intact distal pulses and normal pulses.  Exam reveals no gallop and no friction rub.   No murmur heard. Pulmonary/Chest: Effort normal and breath sounds normal. No tachypnea. No respiratory distress. She has no decreased breath sounds. She has no wheezes. She has no rhonchi. She has no rales.  Abdominal: Soft. Normal appearance and bowel sounds are normal. There is no tenderness.  Neurological: She is alert.  Skin: Skin is warm, dry and intact. No rash noted.  Psychiatric: Her speech is normal and behavior is normal. Judgment and thought content normal. Her mood appears not anxious. Cognition and memory are normal. She does not exhibit a depressed mood.          Assessment & Plan:

## 2016-06-12 ENCOUNTER — Telehealth: Payer: Self-pay | Admitting: Family Medicine

## 2016-06-12 NOTE — Telephone Encounter (Signed)
Pt has appt with Dr Lorelei Pont on 06/15/16 at 10:15.

## 2016-06-12 NOTE — Telephone Encounter (Signed)
Patient Name: Michelle Gill  DOB: Aug 18, 1951    Initial Comment Caller says, wants to make an appt today or Monday if not possible today. She has a lump on the back of her Lt leg, near her ankle. It feels uncomfortable, but doesn't have pain.    Nurse Assessment  Nurse: Raphael Gibney, RN, Vera Date/Time (Eastern Time): 06/12/2016 1:58:33 PM  Confirm and document reason for call. If symptomatic, describe symptoms. You must click the next button to save text entered. ---Caller states she has a lump on the back of her left leg near her ankle. Not tender to the touch. Not red. Noticed it this am.  Has the patient traveled out of the country within the last 30 days? ---Not Applicable  Does the patient have any new or worsening symptoms? ---Yes  Will a triage be completed? ---Yes  Related visit to physician within the last 2 weeks? ---No  Does the PT have any chronic conditions? (i.e. diabetes, asthma, etc.) ---No  Is this a behavioral health or substance abuse call? ---No     Guidelines    Guideline Title Affirmed Question Affirmed Notes  Skin Lump or Localized Swelling [1] Small swelling or lump AND [2] unexplained AND [3] present < 1 week (all triage qestions negative)    Final Disposition User   See PCP When Office is Open (within 3 days) Raphael Gibney, RN, Vanita Ingles    Comments  triage outcome upgraded to see physician within 72 hrs as lump is big enough to rub her shoe.  appt scheduled for 06/15/16 at 10:15 am with Dr. Frederico Hamman [place]   Disagree/Comply: Comply

## 2016-06-15 ENCOUNTER — Other Ambulatory Visit: Payer: Self-pay | Admitting: Family Medicine

## 2016-06-15 ENCOUNTER — Ambulatory Visit (INDEPENDENT_AMBULATORY_CARE_PROVIDER_SITE_OTHER): Payer: BLUE CROSS/BLUE SHIELD | Admitting: Family Medicine

## 2016-06-15 ENCOUNTER — Encounter: Payer: Self-pay | Admitting: Family Medicine

## 2016-06-15 VITALS — BP 106/70 | HR 73 | Temp 98.4°F | Ht 63.5 in | Wt 124.5 lb

## 2016-06-15 DIAGNOSIS — K5900 Constipation, unspecified: Secondary | ICD-10-CM

## 2016-06-15 DIAGNOSIS — R196 Halitosis: Secondary | ICD-10-CM

## 2016-06-15 DIAGNOSIS — M7989 Other specified soft tissue disorders: Secondary | ICD-10-CM | POA: Diagnosis not present

## 2016-06-15 DIAGNOSIS — Z23 Encounter for immunization: Secondary | ICD-10-CM | POA: Diagnosis not present

## 2016-06-15 NOTE — Patient Instructions (Signed)
Tonsillar crypts - water pick    GETTING TO [place]. Irregular bowel habits such as constipation and diarrhea can lead to many problems over time.  Having one soft bowel movement a day is the most important way to prevent further problems.  The anorectal canal is designed to handle stretching and feces to safely manage our ability to get rid of solid waste (feces, poop, stool) out of our body.  BUT, hard constipated stools can act like ripping concrete bricks and diarrhea can be a burning fire to this very sensitive area of our body, causing inflamed hemorrhoids, anal fissures, increasing risk is perirectal abscesses, abdominal pain/bloating, an making irritable bowel worse.     The goal: ONE SOFT BOWEL MOVEMENT A DAY!  To have soft, regular bowel movements:  . Drink at least 8 tall glasses of water a day.   . Take plenty of fiber.  Fiber is the undigested part of plant food that passes into the colon, acting s "natures broom" to encourage bowel motility and movement.  Fiber can absorb and hold large amounts of water. This results in a larger, bulkier stool, which is soft and easier to pass. Work gradually over several weeks up to 6 servings a day of fiber (25g a day even more if needed) in the form of: o Vegetables -- Root (potatoes, carrots, turnips), leafy green (lettuce, salad greens, celery, spinach), or cooked high residue (cabbage, broccoli, etc) o Fruit -- Fresh (unpeeled skin & pulp), Dried (prunes, apricots, cherries, etc ),  or stewed ( applesauce)  o Whole grain breads, pasta, etc (whole wheat)  o Bran cereals  . Bulking Agents -- This type of water-retaining fiber generally is easily obtained each day by one of the following:  o Psyllium bran -- The psyllium plant is remarkable because its ground seeds can retain so much water. This product is available as Metamucil, Konsyl, Effersyllium, Per Diem Fiber, or the less expensive generic preparation in drug and health food stores.  Although labeled a laxative, it really is not a laxative.  o Methylcellulose -- This is another fiber derived from wood which also retains water. It is available as Citrucel.  o Flax Seed - a less gassy fiber than psyllium . No reading or other relaxing activity while on the toilet. If bowel movements take longer than 5 minutes, you are too constipated   . AVOID CONSTIPATION.  High fiber and water intake usually takes care of this.  Sometimes a laxative is needed to stimulate more frequent bowel movements, but  . Laxatives are not a good long-term solution as it can wear the colon out. o Osmotics (Milk of Magnesia, Fleets phosphosoda, Magnesium citrate, MiraLax, GoLytely) are safer than  o Stimulants (Senokot, Castor Oil, Dulcolax, Ex Lax)    o Do not take laxatives for more than 7days in a row. .  IF SEVERELY CONSTIPATED, try a Bowel Retraining Program: o Do not use laxatives.  o Eat a diet high in roughage, such as bran cereals and leafy vegetables.  o Drink six (6) ounces of prune or apricot juice each morning.  o Eat two (2) large servings of stewed fruit each day.  o Take one (1) heaping tablespoon of a psyllium-based bulking agent twice a day. Use sugar-free sweetener when possible to avoid excessive calories.  o Eat a normal breakfast.  o Set aside 15 minutes after breakfast to sit on the toilet, but do not strain to have a bowel movement.  o  If you do not have a bowel movement by the third day, use an enema and repeat the above steps.

## 2016-06-15 NOTE — Progress Notes (Signed)
Dr. Frederico Hamman T. Pelagia Iacobucci, MD, [facility] [place], [address] Phone: [phone] Fax: [phone]  06/15/2016  Patient: Michelle Gill, MRN: [id number], DOB: [birth year], 65 y.o.  Primary Physician:  Eliezer Lofts, MD    Chief Complaint  Patient presents with  . Swollen Place on Back of Left Leg    Not Painful  . Bad breath   Subjective:   Michelle Gill is a 65 y.o. very pleasant female patient who presents with the following:  L leg - distally, some swelling a little bit. No trauma or known injury. She does have a little bit of swelling, more distally. Doesn't hurt at all. There is no significant bruising.  Bad breath: taking mint supplements, this is been ongoing for many years, and she has seen multiple dentists. She does take multiple med supplements as well as she chooses basal.  She also has intermittent constipation, off and on for many years.  Past Medical History, Surgical History, Social History, Family History, Problem List, Medications, and Allergies have been reviewed and updated if relevant.  Patient Active Problem List   Diagnosis Date Noted  . Peripheral edema 04/14/2016  . Atypical chest pain 01/31/2016  . Irritant contact dermatitis 08/14/2015  . Constipation 07/19/2015  . Fatigue 07/24/2014  . Family history of kidney disease in brother 07/24/2014  . Vitamin D deficiency 07/24/2014  . Bilateral low back pain without sciatica 07/24/2014  . Generalized anxiety disorder 06/15/2014  . Bilateral knee pain 03/23/2014  . Allergic rhinitis 04/25/2013  . Exercise-induced asthma 04/25/2013  . Osteoarthritis 04/25/2013  . Osteoporosis 04/25/2013  . Cervical disc disorder with radiculopathy of cervical region 04/25/2013  . Benign essential HTN 04/25/2013  . Photosensitivity 04/25/2013  . Dysphagia, unspecified(787.20) 03/30/2013  . Anemia, iron deficiency 03/30/2013    Past Medical History:    Diagnosis Date  . Anal fissure   . Anemia   . Anxiety   . Asthma    exercise induced  . Hypertension   . Pneumonia 2006    Past Surgical History:  Procedure Laterality Date  . 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 SINUS IRRIGATION    . VAGINAL HYSTERECTOMY  1986   partial    Social History   Social History  . Marital status: Married    Spouse name: N/A  . Number of children: 1  . Years of education: N/A   Occupational History  . retired    Social History Main Topics  . Smoking status: Never Smoker  . Smokeless tobacco: Never Used  . Alcohol use No  . Drug use: No  . Sexual activity: Yes    Partners: Female   Other Topics Concern  . Not on file   Social History Narrative   Married, one daughter age 13          Family History  Problem Relation Age of Onset  . Diabetes Brother   . Arthritis Brother   . Gout Brother   . Diabetes Sister     pre diabetic  . Arthritis Sister   . Gout Sister   . Alzheimer's disease Mother   . Hyperlipidemia Mother   . Hypertension Mother   . Arthritis Mother   . Gout Mother   . Congestive Heart Failure Mother   . Kidney disease Mother   . Cancer Father 66    stomach cancer  . Alcohol abuse Father   .  Alcohol abuse Brother   . Colon cancer Neg Hx     Allergies  Allergen Reactions  . Morphine And Related     Felt like she was going to explode, heart raced  . Naproxen Swelling    REACTION: Finger tips swell and turn black!!!    Medication list reviewed and updated in full in [place].   GEN: No acute illnesses, no fevers, chills. GI: No n/v/d, eating normally Pulm: No SOB Interactive and getting along well at home.  Otherwise, ROS is as per the HPI.  Objective:   BP 106/70   Pulse 73   Temp 98.4 F (36.9 C) (Oral)   Ht 5' 3.5" (1.613 m)   Wt 124 lb 8 oz (56.5 kg)   BMI 21.71 kg/m   GEN: WDWN, NAD, Non-toxic, A & O x 3 HEENT: Atraumatic, Normocephalic. Neck supple. No masses, No LAD.  Prominent tonsillar crypts. Ears and Nose: No external deformity. CV: RRR, No M/G/R. No JVD. No thrill. No extra heart sounds. PULM: CTA B, no wheezes, crackles, rhonchi. No retractions. No resp. distress. No accessory muscle use. EXTR: No c/c/e NEURO Normal gait.  PSYCH: Normally interactive. Conversant. Not depressed or anxious appearing.  Calm demeanor.   Laboratory and Imaging Data:  Assessment and Plan:   Bad breath  Need for prophylactic vaccination and inoculation against influenza - Plan: Flu Vaccine QUAD 36+ mos IM  Need for prophylactic vaccination against Streptococcus pneumoniae (pneumococcus) - Plan: Pneumococcal polysaccharide vaccine 23-valent greater than or equal to 2yo subcutaneous/IM  Constipation, unspecified constipation type  Leg swelling  >25 minutes spent in face to face time with patient, >50% spent in counselling or coordination of care  Refer to the patient instructions sections for details of plan shared with patient.   Reassured regarding minimal leg swelling ongoing for a few days.  Reviewed basic constipation care as well as reviewed some other ideas regarding bad breath including care of tonsillar crypts.  Follow-up: No Follow-up on file.  Orders Placed This Encounter  Procedures  . Flu Vaccine QUAD 36+ mos IM  . Pneumococcal polysaccharide vaccine 23-valent greater than or equal to 2yo subcutaneous/IM   Patient Instructions  Tonsillar crypts - water pick    GETTING TO [place]. Irregular bowel habits such as constipation and diarrhea can lead to many problems over time.  Having one soft bowel movement a day is the most important way to prevent further problems.  The anorectal canal is designed to handle stretching and feces to safely manage our ability to get rid of solid waste (feces, poop, stool) out of our body.  BUT, hard constipated stools can act like ripping concrete bricks and diarrhea can be a burning fire to this very  sensitive area of our body, causing inflamed hemorrhoids, anal fissures, increasing risk is perirectal abscesses, abdominal pain/bloating, an making irritable bowel worse.     The goal: ONE SOFT BOWEL MOVEMENT A DAY!  To have soft, regular bowel movements:  . Drink at least 8 tall glasses of water a day.   . Take plenty of fiber.  Fiber is the undigested part of plant food that passes into the colon, acting s "natures broom" to encourage bowel motility and movement.  Fiber can absorb and hold large amounts of water. This results in a larger, bulkier stool, which is soft and easier to pass. Work gradually over several weeks up to 6 servings a day of fiber (25g a day even more if  needed) in the form of: o Vegetables -- Root (potatoes, carrots, turnips), leafy green (lettuce, salad greens, celery, spinach), or cooked high residue (cabbage, broccoli, etc) o Fruit -- Fresh (unpeeled skin & pulp), Dried (prunes, apricots, cherries, etc ),  or stewed ( applesauce)  o Whole grain breads, pasta, etc (whole wheat)  o Bran cereals  . Bulking Agents -- This type of water-retaining fiber generally is easily obtained each day by one of the following:  o Psyllium bran -- The psyllium plant is remarkable because its ground seeds can retain so much water. This product is available as Metamucil, Konsyl, Effersyllium, Per Diem Fiber, or the less expensive generic preparation in drug and health food stores. Although labeled a laxative, it really is not a laxative.  o Methylcellulose -- This is another fiber derived from wood which also retains water. It is available as Citrucel.  o Flax Seed - a less gassy fiber than psyllium . No reading or other relaxing activity while on the toilet. If bowel movements take longer than 5 minutes, you are too constipated   . AVOID CONSTIPATION.  High fiber and water intake usually takes care of this.  Sometimes a laxative is needed to stimulate more frequent bowel movements, but   . Laxatives are not a good long-term solution as it can wear the colon out. o Osmotics (Milk of Magnesia, Fleets phosphosoda, Magnesium citrate, MiraLax, GoLytely) are safer than  o Stimulants (Senokot, Castor Oil, Dulcolax, Ex Lax)    o Do not take laxatives for more than 7days in a row. .  IF SEVERELY CONSTIPATED, try a Bowel Retraining Program: o Do not use laxatives.  o Eat a diet high in roughage, such as bran cereals and leafy vegetables.  o Drink six (6) ounces of prune or apricot juice each morning.  o Eat two (2) large servings of stewed fruit each day.  o Take one (1) heaping tablespoon of a psyllium-based bulking agent twice a day. Use sugar-free sweetener when possible to avoid excessive calories.  o Eat a normal breakfast.  o Set aside 15 minutes after breakfast to sit on the toilet, but do not strain to have a bowel movement.  o If you do not have a bowel movement by the third day, use an enema and repeat the above steps.       Signed,  Maud Deed. Denice Cardon, MD   Patient's Medications  New Prescriptions   No medications on file  Previous Medications   BIOTIN 2500 MCG CAPS    Take 1 capsule by mouth daily.   CHOLECALCIFEROL (VITAMIN D) 1000 UNITS TABLET    Take 2,000 Units by mouth daily.    ESOMEPRAZOLE (NEXIUM) 40 MG CAPSULE    Take 40 mg by mouth as needed. For acid reflux   FERROUS SULFATE 324 (65 FE) MG TBEC    Take 1 tablet by mouth daily.   HYDROCHLOROTHIAZIDE (HYDRODIURIL) 25 MG TABLET    1 tab daily with additional 1/2 tab daily as needed for swelling.   TRIAMCINOLONE (NASACORT) 55 MCG/ACT AERO NASAL INHALER    PLACE 2 SPRAYS IN EACH NOSTRIL ONCE A DAY FOR ALLERGIES   VENTOLIN HFA 108 (90 BASE) MCG/ACT INHALER    INHALE 2 PUFFS EVERY 4 HOURS AS NEEDED  Modified Medications   No medications on file  Discontinued Medications   CHLORHEXIDINE (PERIDEX) 0.12 % SOLUTION    SWISH AND SPIT WITH 15 MILLILITERS BY MOUTH TWICE A DAY

## 2016-06-15 NOTE — Progress Notes (Signed)
Pre visit review using our clinic review tool, if applicable. No additional management support is needed unless otherwise documented below in the visit note. 

## 2016-06-16 ENCOUNTER — Telehealth: Payer: Self-pay | Admitting: Family Medicine

## 2016-06-16 NOTE — Telephone Encounter (Signed)
[facility]  Patient Name: Michelle Gill  DOB: May 27, 1951    Initial Comment Caller: Received booster shots 06/15/2016. Pneuomia and flu shots with red welps, broken out, very painful to touch, right arm. No fever, has heat near injection site and swollen.   Nurse Assessment  Nurse: Harlow Mares, RN, Suanne Marker Date/Time Eilene Ghazi Time): 06/16/2016 4:30:22 PM  Confirm and document reason for call. If symptomatic, describe symptoms. You must click the next button to save text entered. ---Received booster shots 06/15/2016. Pneumonia and flu shots with red whelps, broken out, very painful to touch, right arm. No fever, has heat near injection site and swollen.  Has the patient traveled out of the country within the last 30 days? ---Not Applicable  Does the patient have any new or worsening symptoms? ---Yes  Will a triage be completed? ---Yes  Related visit to physician within the last 2 weeks? ---Yes  Does the PT have any chronic conditions? (i.e. diabetes, asthma, etc.) ---Yes  List chronic conditions. ---HTN,  Is this a behavioral health or substance abuse call? ---No     Guidelines    Guideline Title Affirmed Question Affirmed Notes  Immunization Reactions Influenza (TIV; Injection) injected vaccine reactions (all triage questions negative)    Final Disposition User   [place], RN, Suanne Marker    Disagree/Comply: Leta Baptist

## 2016-06-16 NOTE — Telephone Encounter (Signed)
PLEASE NOTE: All timestamps contained within this report are represented as [place] Standard Time. CONFIDENTIALTY NOTICE: This fax transmission is intended only for the addressee. It contains information that is legally privileged, confidential or otherwise protected from use or disclosure. If you are not the intended recipient, you are strictly prohibited from reviewing, disclosing, copying using or disseminating any of this information or taking any action in reliance on or regarding this information. If you have received this fax in error, please notify us immediately by telephone so that we can arrange for its return to [place]. Phone: [phone], Toll-Free: [phone], Fax: [phone] Page: 1 of 2 Call Id: [id number] [place] Patient Name: Michelle Gill Gender: Female DOB: 1951-08-14 Age: 65 Y 2 M 5 D Return Phone Number: [phone] (Primary), [phone] (Secondary) Address: City/State/Zip: [place] [address] Client [facility] - Client Client Site [place] - Day Physician Eliezer Lofts - MD Contact Type Call Who Is Calling Patient / Member / Family / Caregiver Call Type Triage / Clinical Relationship To Patient Self Return Phone Number [phone] (Primary) Chief Complaint Immunization Reaction Reason for Call Symptomatic / Request for Health Information Initial Comment Caller: Received booster shots 06/15/2016. Pneuomia and flu shots with red welps, broken out, very painful to touch, right arm. No fever, has heat near injection site and swollen. Appointment Disposition EMR Appointment Not Necessary Info pasted into Epic Yes PreDisposition Did not know what to do Translation No Nurse Assessment Nurse: Harlow Mares, RN, Suanne Marker Date/Time Eilene Ghazi Time): 06/16/2016 4:30:22 PM Confirm and document reason for call. If  symptomatic, describe symptoms. You must click the next button to save text entered. ---Received booster shots 06/15/2016. Pneumonia and flu shots with red whelps, broken out, very painful to touch, right arm. No fever, has heat near injection site and swollen. Has the patient traveled out of the country within the last 30 days? ---Not Applicable Does the patient have any new or worsening symptoms? ---Yes Will a triage be completed? ---Yes Related visit to physician within the last 2 weeks? ---Yes Does the PT have any chronic conditions? (i.e. diabetes, asthma, etc.) ---Yes List chronic conditions. ---HTN, Is this a behavioral health or substance abuse call? ---No Guidelines Guideline Title Affirmed Question Affirmed Notes Nurse Date/Time (Eastern Time) Immunization Reactions Influenza (TIV; Injection) injected vaccine reactions (all triage questions negative) Harlow Mares, RN, Suanne Marker 06/16/2016 4:30:40 PM PLEASE NOTE: All timestamps contained within this report are represented as [place] Standard Time. CONFIDENTIALTY NOTICE: This fax transmission is intended only for the addressee. It contains information that is legally privileged, confidential or otherwise protected from use or disclosure. If you are not the intended recipient, you are strictly prohibited from reviewing, disclosing, copying using or disseminating any of this information or taking any action in reliance on or regarding this information. If you have received this fax in error, please notify us immediately by telephone so that we can arrange for its return to [place]. Phone: [phone], Toll-Free: [phone], Fax: [phone] Page: 2 of 2 Call Id: [id number] [place]. Time Eilene Ghazi Time) Disposition Final User 06/16/2016 4:40:31 PM Home Care Yes Harlow Mares, RN, Rosalyn Charters Understands: Yes Disagree/Comply: Comply Care Advice Given Per Guideline HOME CARE: You should be able to treat this at home. NOTE TO TRIAGER: * Discuss the  COMMON REACTIONS with the caller. Reassure the caller that these reactions are generally harmless. * DISCUSS THE RARE ADVERSE REACTIONS only  if the caller specifically asks. INFLUENZA VIRUS VACCINE (TIV; INJECTED) - COMMON REACTIONS: * Local pain at injection site * Fever * Aches * If these symptoms occur, they usually last 1-2 days. COLD PACK FOR LOCAL REACTION AT INJECTION SITE: * Apply a cold pack or ice in a wet washcloth to the area for 20 minutes. Repeat in 1 hour. * Then apply as needed for the first 48 hours after the injection. (Reason: reduce the pain and swelling.) PAIN MEDICINES: * For pain relief, take acetaminophen, ibuprofen, or naproxen. * Use the lowest amount that makes your pain feel better. ACETAMINOPHEN (E.G., TYLENOL): * Take 650 mg (two 325 mg pills) by mouth every 4-6 hours as needed. Each Regular Strength Tylenol pill has 325 mg of acetaminophen. The most you should take each day is 3,250 mg (10 Regular Strength pills a day). * Another choice is to take 1,000 mg (two 500 mg pills) every 8 hours as needed. Each Extra Strength Tylenol pill has 500 mg of acetaminophen. The most you should take each day is 3,000 mg (6 Extra Strength pills a day). IBUPROFEN (E.G., MOTRIN, ADVIL): * Take 400 mg (two 200 mg pills) by mouth every 6 hours as needed. * Another choice is to take 600 mg (three 200 mg pills) by mouth every 8 hours as needed. * The most you should take each day is 1,200 mg (six 200 mg pills a day), unless your doctor has told you to take more. CALL BACK IF: * Fever lasts over 3 days * Pain lasts over 3 days * Redness or swelling lasts over 3 days * You become worse. CARE ADVICE given per Immunization Reactions (Adult) guideline.

## 2016-06-17 NOTE — Telephone Encounter (Signed)
Call pt.. If she has not improved in AM.. Needs to be seen in office.  IF SOB, tounge swelling to ER.

## 2016-06-18 NOTE — Telephone Encounter (Signed)
Spoke with Mrs. Rosasco.  She states she feels like her arm is getting better.  She states is was the right arm where the pneumovax was given.  She was unable to lift her arm for 2 days.  She has been using Tylenol and put ice on her arm.  She states now the pain is tolerable and her arm is not swollen.  She does not feel she needs to be seen at this time.

## 2016-06-19 NOTE — Telephone Encounter (Signed)
She can also add antihistamine likel zyrtec daily qHS  To help with  The local allergic reaction as well.

## 2016-06-19 NOTE — Telephone Encounter (Signed)
Michelle Gill notified as instructed by telephone.  

## 2016-07-24 ENCOUNTER — Ambulatory Visit (INDEPENDENT_AMBULATORY_CARE_PROVIDER_SITE_OTHER): Payer: BLUE CROSS/BLUE SHIELD | Admitting: Family Medicine

## 2016-07-24 VITALS — BP 100/62 | HR 69 | Temp 98.4°F | Ht 63.5 in | Wt 121.8 lb

## 2016-07-24 DIAGNOSIS — N644 Mastodynia: Secondary | ICD-10-CM | POA: Diagnosis not present

## 2016-07-24 NOTE — Patient Instructions (Signed)
Stop at front desk for referral for mammogram and [place] of left breast. Stop or decrease caffeine and drink lots of water !

## 2016-07-24 NOTE — Progress Notes (Signed)
   Subjective:    Patient ID: Tzippora Gilleo, female    DOB: 1951/03/21, 65 y.o.   MRN: [id number]  HPI    65 year old female presents with left breast tenderness ongoing x 5-6 days.  Gradually improving. No rash, no redness, no mass.  Soreness below nipple and at nipple. No fall, no injury.  No nipple discharge. She drinks a lot of caffeine 4 diet cokes a day. Nonsmoker. No meds that cause breast pain, no new meds.   nml mammogram 12/2015    Review of Systems  Constitutional: Negative for fatigue and fever.  HENT: Negative for ear pain.   Eyes: Negative for pain.  Respiratory: Negative for chest tightness and shortness of breath.   Cardiovascular: Negative for chest pain, palpitations and leg swelling.  Gastrointestinal: Negative for abdominal pain.  Genitourinary: Negative for dysuria.       Objective:   Physical Exam  Constitutional: Vital signs are normal. She appears well-developed and well-nourished. She is cooperative.  Non-toxic appearance. She does not appear ill. No distress.  HENT:  Head: Normocephalic.  Right Ear: Hearing, tympanic membrane, external ear and ear canal normal. Tympanic membrane is not erythematous, not retracted and not bulging.  Left Ear: Hearing, tympanic membrane, external ear and ear canal normal. Tympanic membrane is not erythematous, not retracted and not bulging.  Nose: No mucosal edema or rhinorrhea. Right sinus exhibits no maxillary sinus tenderness and no frontal sinus tenderness. Left sinus exhibits no maxillary sinus tenderness and no frontal sinus tenderness.  Mouth/Throat: Uvula is midline, oropharynx is clear and moist and mucous membranes are normal.  Eyes: Conjunctivae, EOM and lids are normal. Pupils are equal, round, and reactive to light. Lids are everted and swept, no foreign bodies found.  Neck: Trachea normal and normal range of motion. Neck supple. Carotid bruit is not present. No thyroid mass and no thyromegaly present.    Cardiovascular: Normal rate, regular rhythm, S1 normal, S2 normal, normal heart sounds, intact distal pulses and normal pulses.  Exam reveals no gallop and no friction rub.   No murmur heard. Pulmonary/Chest: Effort normal and breath sounds normal. No tachypnea. No respiratory distress. She has no decreased breath sounds. She has no wheezes. She has no rhonchi. She has no rales.  Abdominal: Soft. Normal appearance and bowel sounds are normal. There is no tenderness.  Genitourinary: There is breast tenderness. No breast swelling, discharge or bleeding.  Genitourinary Comments: No mass noted.  Neurological: She is alert.  Skin: Skin is warm, dry and intact. No rash noted.  Psychiatric: Her speech is normal and behavior is normal. Judgment and thought content normal. Her mood appears not anxious. Cognition and memory are normal. She does not exhibit a depressed mood.          Assessment & Plan:

## 2016-07-24 NOTE — Progress Notes (Signed)
Pre visit review using our clinic review tool, if applicable. No additional management support is needed unless otherwise documented below in the visit note. 

## 2016-08-04 ENCOUNTER — Encounter: Payer: Self-pay | Admitting: Family Medicine

## 2016-09-08 ENCOUNTER — Telehealth: Payer: Self-pay | Admitting: Family Medicine

## 2016-09-08 DIAGNOSIS — D508 Other iron deficiency anemias: Secondary | ICD-10-CM

## 2016-09-08 DIAGNOSIS — E559 Vitamin D deficiency, unspecified: Secondary | ICD-10-CM

## 2016-09-08 DIAGNOSIS — I10 Essential (primary) hypertension: Secondary | ICD-10-CM

## 2016-09-08 NOTE — Telephone Encounter (Signed)
-----   Message from Ellamae Sia sent at 09/04/2016 10:33 AM EST ----- Regarding: Lab orders for Thursday, 1.11.18 Patient is scheduled for CPX labs, please order future labs, Thanks , Karna Christmas

## 2016-09-17 ENCOUNTER — Other Ambulatory Visit (INDEPENDENT_AMBULATORY_CARE_PROVIDER_SITE_OTHER): Payer: BLUE CROSS/BLUE SHIELD

## 2016-09-17 ENCOUNTER — Other Ambulatory Visit: Payer: Medicare Other

## 2016-09-17 DIAGNOSIS — D508 Other iron deficiency anemias: Secondary | ICD-10-CM | POA: Diagnosis not present

## 2016-09-17 DIAGNOSIS — E559 Vitamin D deficiency, unspecified: Secondary | ICD-10-CM | POA: Diagnosis not present

## 2016-09-17 DIAGNOSIS — I10 Essential (primary) hypertension: Secondary | ICD-10-CM

## 2016-09-17 LAB — COMPREHENSIVE METABOLIC PANEL
ALT: 15 U/L (ref 0–35)
AST: 21 U/L (ref 0–37)
Albumin: 4.4 g/dL (ref 3.5–5.2)
Alkaline Phosphatase: 115 U/L (ref 39–117)
BILIRUBIN TOTAL: 0.8 mg/dL (ref 0.2–1.2)
BUN: 7 mg/dL (ref 6–23)
CO2: 31 meq/L (ref 19–32)
CREATININE: 0.65 mg/dL (ref 0.40–1.20)
Calcium: 10.1 mg/dL (ref 8.4–10.5)
Chloride: 102 mEq/L (ref 96–112)
GFR: 116.77 mL/min (ref >60.00)
Glucose, Bld: 101 mg/dL — ABNORMAL HIGH (ref 70–99)
Potassium: 3.5 mEq/L (ref 3.5–5.1)
Sodium: 141 mEq/L (ref 135–145)
Total Protein: 7 g/dL (ref 6.0–8.3)

## 2016-09-17 LAB — CBC WITH DIFFERENTIAL/PLATELET
BASOS PCT: 0.8 % (ref 0.0–3.0)
Basophils Absolute: 0 10*3/uL (ref 0.0–0.1)
Eosinophils Absolute: 0.2 10*3/uL (ref 0.0–0.7)
Eosinophils Relative: 3.8 % (ref 0.0–5.0)
HEMATOCRIT: 39.5 % (ref 36.0–46.0)
Hemoglobin: 13 g/dL (ref 12.0–15.0)
LYMPHS PCT: 34.6 % (ref 12.0–46.0)
Lymphs Abs: 1.5 10*3/uL (ref 0.7–4.0)
MCHC: 32.8 g/dL (ref 30.0–36.0)
MCV: 73.4 fl — AB (ref 78.0–100.0)
MONOS PCT: 6.9 % (ref 3.0–12.0)
Monocytes Absolute: 0.3 10*3/uL (ref 0.1–1.0)
NEUTROS ABS: 2.3 10*3/uL (ref 1.4–7.7)
Neutrophils Relative %: 53.9 % (ref 43.0–77.0)
PLATELETS: 222 10*3/uL (ref 150.0–400.0)
RBC: 5.38 Mil/uL — ABNORMAL HIGH (ref 3.87–5.11)
RDW: 13.6 % (ref 11.5–15.5)
WBC: 4.4 10*3/uL (ref 4.0–10.5)

## 2016-09-17 LAB — VITAMIN D 25 HYDROXY (VIT D DEFICIENCY, FRACTURES): VITD: 68.59 ng/mL (ref 30.00–100.00)

## 2016-09-17 LAB — LIPID PANEL
CHOL/HDL RATIO: 2
Cholesterol: 221 mg/dL — ABNORMAL HIGH (ref 0–200)
HDL: 95.7 mg/dL (ref >39.00)
LDL Cholesterol: 115 mg/dL — ABNORMAL HIGH (ref 0–99)
NONHDL: 125.51
Triglycerides: 53 mg/dL (ref 0.0–149.0)
VLDL: 10.6 mg/dL (ref 0.0–40.0)

## 2016-09-22 ENCOUNTER — Encounter: Payer: Self-pay | Admitting: Family Medicine

## 2016-09-22 ENCOUNTER — Ambulatory Visit (INDEPENDENT_AMBULATORY_CARE_PROVIDER_SITE_OTHER): Payer: BLUE CROSS/BLUE SHIELD | Admitting: Family Medicine

## 2016-09-22 VITALS — BP 120/70 | HR 86 | Temp 98.4°F | Ht 63.5 in | Wt 121.2 lb

## 2016-09-22 DIAGNOSIS — D508 Other iron deficiency anemias: Secondary | ICD-10-CM

## 2016-09-22 DIAGNOSIS — Z1159 Encounter for screening for other viral diseases: Secondary | ICD-10-CM | POA: Diagnosis not present

## 2016-09-22 DIAGNOSIS — M810 Age-related osteoporosis without current pathological fracture: Secondary | ICD-10-CM | POA: Diagnosis not present

## 2016-09-22 DIAGNOSIS — Z0000 Encounter for general adult medical examination without abnormal findings: Secondary | ICD-10-CM | POA: Diagnosis not present

## 2016-09-22 DIAGNOSIS — I10 Essential (primary) hypertension: Secondary | ICD-10-CM | POA: Diagnosis not present

## 2016-09-22 DIAGNOSIS — F4323 Adjustment disorder with mixed anxiety and depressed mood: Secondary | ICD-10-CM | POA: Diagnosis not present

## 2016-09-22 DIAGNOSIS — E559 Vitamin D deficiency, unspecified: Secondary | ICD-10-CM

## 2016-09-22 NOTE — Assessment & Plan Note (Signed)
Due for repeat DEXA. 

## 2016-09-22 NOTE — Assessment & Plan Note (Signed)
Refer to counseling.

## 2016-09-22 NOTE — Progress Notes (Signed)
Subjective:    Patient ID: Michelle Gill, female    DOB: [birth year], 66 y.o.   MRN: [id number]  HPI  The patient is here for annual wellness exam and preventative care.    nonmedicare  Hypertension:  Well controlled on HCTZ    BP Readings from Last 3 Encounters:  09/22/16 120/70  07/24/16 100/62  06/15/16 106/70  Using medication without problems or lightheadedness: none Chest pain with exertion:none Edema:none Short of breath: none Average home BPs: good Other issues:   Reviewed labs in detail . 9 % 10 year risk for AHA risk calculator. No statin indicated. Lab Results  Component Value Date   CHOL 221 (H) 09/17/2016   HDL 95.70 09/17/2016   LDLCALC 115 (H) 09/17/2016   LDLDIRECT 116.0 05/24/2013   TRIG 53.0 09/17/2016   CHOLHDL 2 09/17/2016  Diet: good Exercise: elliptical 20 min 5 days a week Xbox bowling.  Wt Readings from Last 3 Encounters:  09/22/16 121 lb 4 oz (55 kg)  07/24/16 121 lb 12 oz (55.2 kg)  06/15/16 124 lb 8 oz (56.5 kg)   .She has been feeling anxious , depressed given living situation with her sister.  She requests referral to counselor.  She feels as though she is in the middle.  Social History /Family History/Past Medical History reviewed and updated if needed.  Review of Systems  Constitutional: Negative for fatigue and fever.  HENT: Negative for congestion.   Eyes: Negative for pain.  Respiratory: Negative for cough and shortness of breath.   Cardiovascular: Negative for chest pain, palpitations and leg swelling.  Gastrointestinal: Negative for abdominal pain.  Genitourinary: Negative for dysuria and vaginal bleeding.  Musculoskeletal: Positive for back pain.  Neurological: Negative for syncope, light-headedness and headaches.  Psychiatric/Behavioral: Positive for dysphoric mood. The patient is nervous/anxious.        Increase in stress, sister living with them and it is not working.       Objective:   Physical Exam    Constitutional: Vital signs are normal. She appears well-developed and well-nourished. She is cooperative.  Non-toxic appearance. She does not appear ill. No distress.  HENT:  Head: Normocephalic.  Right Ear: Hearing, tympanic membrane, external ear and ear canal normal.  Left Ear: Hearing, tympanic membrane, external ear and ear canal normal.  Nose: Nose normal.  Eyes: Conjunctivae, EOM and lids are normal. Pupils are equal, round, and reactive to light. Lids are everted and swept, no foreign bodies found.  Neck: Trachea normal and normal range of motion. Neck supple. Carotid bruit is not present. No thyroid mass and no thyromegaly present.  Cardiovascular: Normal rate, regular rhythm, S1 normal, S2 normal, normal heart sounds and intact distal pulses.  Exam reveals no gallop.   No murmur heard. Pulmonary/Chest: Effort normal and breath sounds normal. No respiratory distress. She has no wheezes. She has no rhonchi. She has no rales.  Abdominal: Soft. Normal appearance and bowel sounds are normal. She exhibits no distension, no fluid wave, no abdominal bruit and no mass. There is no hepatosplenomegaly. There is no tenderness. There is no rebound, no guarding and no CVA tenderness. No hernia. Hernia confirmed negative in the right inguinal area and confirmed negative in the left inguinal area.  Genitourinary: Vagina normal. No labial fusion. There is no rash, tenderness, lesion or injury on the right labia. There is no rash, tenderness, lesion or injury on the left labia. Right adnexum displays no mass, no tenderness and no fullness. Left adnexum displays  no mass, no tenderness and no fullness.  Genitourinary Comments: No uterus , no cervix  Lymphadenopathy:    She has no cervical adenopathy.    She has no axillary adenopathy.       Right: No inguinal adenopathy present.       Left: No inguinal adenopathy present.  Neurological: She is alert. She has normal strength. No cranial nerve deficit or  sensory deficit.  Skin: Skin is warm, dry and intact. No rash noted.  Psychiatric: Her speech is normal and behavior is normal. Judgment normal. Her mood appears not anxious. Cognition and memory are normal. She does not exhibit a depressed mood.          Assessment & Plan:  The patient's preventative maintenance and recommended screening tests for an annual wellness exam were reviewed in full today. Brought up to date unless services declined.  Counselled on the importance of diet, exercise, and its role in overall health and mortality. The patient's FH and SH was reviewed, including their home life, tobacco status, and drug and alcohol status.   Mammogram  12/2015 and 07/2016  Diagnostic for left breast pain. Colonoscopy 2010, Dr. Deatra Ina, plans repeat in 2020.  Last DEXA: 12/2014 improved and almost in osteopenia range SE to evista and SE fosamax. Nervous about forteo. Plan recheck in 2017/2018 scheduled. PAP/DVE: no pap indicated partial hystec, no new abdominal symptoms, no ovarian cancer in family. DVE every other year. (due this year) Vaccines:uptodate with PNA, Tdap, zoster, Flu,prevnar  Nonsmoker  Hep C: due

## 2016-09-22 NOTE — Assessment & Plan Note (Signed)
Well controlled. Continue current medication.  

## 2016-09-22 NOTE — Addendum Note (Signed)
Addended by: Frutoso Chase A on: 09/22/2016 03:58 PM   Modules accepted: Orders

## 2016-09-22 NOTE — Progress Notes (Signed)
Pre visit review using our clinic review tool, if applicable. No additional management support is needed unless otherwise documented below in the visit note. 

## 2016-09-22 NOTE — Assessment & Plan Note (Addendum)
Stable on  Ferrous sulfate 325mg  . Pt not able to stop given iron intake.

## 2016-09-22 NOTE — Patient Instructions (Addendum)
Stop at front desk on way out.  Work on stress reduction.  Stop at lab on way out for Hep C. Keep working on health low carb , low cholesterol diet and regular exercise.

## 2016-09-22 NOTE — Assessment & Plan Note (Signed)
Good control on supplement. 

## 2016-09-23 ENCOUNTER — Other Ambulatory Visit: Payer: BLUE CROSS/BLUE SHIELD

## 2016-09-24 NOTE — Assessment & Plan Note (Signed)
Eval with mammogram and [place]. Decrease caffeine, increase water.

## 2016-11-07 ENCOUNTER — Encounter: Payer: Self-pay | Admitting: Family Medicine

## 2016-11-07 ENCOUNTER — Ambulatory Visit (INDEPENDENT_AMBULATORY_CARE_PROVIDER_SITE_OTHER): Payer: BLUE CROSS/BLUE SHIELD | Admitting: Family Medicine

## 2016-11-07 VITALS — BP 100/72 | HR 89 | Temp 98.3°F | Resp 16 | Ht 63.5 in | Wt 120.0 lb

## 2016-11-07 DIAGNOSIS — R509 Fever, unspecified: Secondary | ICD-10-CM | POA: Diagnosis not present

## 2016-11-07 DIAGNOSIS — J029 Acute pharyngitis, unspecified: Secondary | ICD-10-CM | POA: Diagnosis not present

## 2016-11-07 DIAGNOSIS — J329 Chronic sinusitis, unspecified: Secondary | ICD-10-CM | POA: Insufficient documentation

## 2016-11-07 DIAGNOSIS — J0100 Acute maxillary sinusitis, unspecified: Secondary | ICD-10-CM | POA: Diagnosis not present

## 2016-11-07 LAB — POCT RAPID STREP A (OFFICE): Rapid Strep A Screen: NEGATIVE

## 2016-11-07 LAB — POC INFLUENZA A&B (BINAX/QUICKVUE)
INFLUENZA B, POC: NEGATIVE
Influenza A, POC: NEGATIVE

## 2016-11-07 MED ORDER — AMOXICILLIN-POT CLAVULANATE 875-125 MG PO TABS: 1.0000 | ORAL_TABLET | Freq: Two times a day (BID) | ORAL | 0 refills | Status: DC

## 2016-11-07 NOTE — Assessment & Plan Note (Signed)
Given severity of sinus pressure, purulent sinus drainage, and chills and sweats we'll treat for bacterial sinusitis given that his been 3 days. We will cover with Augmentin. Discussed yogurt or probiotic with this. She'll monitor and if not improving she'll follow-up. Given return precautions.

## 2016-11-07 NOTE — Progress Notes (Signed)
  Tommi Rumps, MD Phone: [phone]  Michelle Gill is a 66 y.o. female who presents today for same-day visit.  Patient notes onset of symptoms 3 days ago. Has had sore throat, severe maxillary sinus pressure, chills, sweats, blowing yellow mucus out of her nose. She notes some postnasal drip. No cough. No shortness of breath. She has been around sick family members with strep throat and influenza. She does note feeling as though she had a low-grade fever as well.   ROS see history of present illness  Objective  Physical Exam Vitals:   11/07/16 0936  BP: 100/72  Pulse: 89  Resp: 16  Temp: 98.3 F (36.8 C)    BP Readings from Last 3 Encounters:  11/07/16 100/72  09/22/16 120/70  07/24/16 100/62   Wt Readings from Last 3 Encounters:  11/07/16 120 lb (54.4 kg)  09/22/16 121 lb 4 oz (55 kg)  07/24/16 121 lb 12 oz (55.2 kg)    Physical Exam  Constitutional: No distress.  HENT:  Head: Normocephalic and atraumatic.  Mouth/Throat: Oropharynx is clear and moist. No oropharyngeal exudate.  Normal TMs bilaterally  Eyes: Conjunctivae are normal. Pupils are equal, round, and reactive to light.  Neck: Neck supple.  Cardiovascular: Normal rate, regular rhythm and normal heart sounds.   Pulmonary/Chest: Effort normal and breath sounds normal.  Lymphadenopathy:    She has no cervical adenopathy.  Neurological: She is alert. Gait normal.  Skin: Skin is warm and dry. She is not diaphoretic.     Assessment/Plan: Please see individual problem list.  Sinusitis Given severity of sinus pressure, purulent sinus drainage, and chills and sweats we'll treat for bacterial sinusitis given that his been 3 days. We will cover with Augmentin. Discussed yogurt or probiotic with this. She'll monitor and if not improving she'll follow-up. Given return precautions.   Orders Placed This Encounter  Procedures  . POC Influenza A&B(BINAX/QUICKVUE)  . POCT rapid strep A  Negative rapid  flu and strep test.  Meds ordered this encounter  Medications  . amoxicillin-clavulanate (AUGMENTIN) 875-125 MG tablet    Sig: Take 1 tablet by mouth 2 (two) times daily.    Dispense:  14 tablet    Refill:  0    Tommi Rumps, MD [place]

## 2016-11-07 NOTE — Progress Notes (Signed)
Pre visit review using our clinic review tool, if applicable. No additional management support is needed unless otherwise documented below in the visit note. 

## 2016-11-07 NOTE — Patient Instructions (Addendum)
Nice to see you. You have a sinus infection. We will treat this with augmentin. Please start on a probiotic or yogurt with the antibiotic. If you develop worsening symptoms, cough productive of blood, fevers, or any new or changing symptoms please seek medical attention.

## 2016-12-11 ENCOUNTER — Other Ambulatory Visit: Payer: Self-pay | Admitting: Family Medicine

## 2017-01-01 DIAGNOSIS — K219 Gastro-esophageal reflux disease without esophagitis: Secondary | ICD-10-CM | POA: Insufficient documentation

## 2017-01-13 LAB — HM MAMMOGRAPHY

## 2017-01-15 ENCOUNTER — Ambulatory Visit: Payer: BLUE CROSS/BLUE SHIELD | Admitting: Family Medicine

## 2017-01-15 ENCOUNTER — Encounter: Payer: Self-pay | Admitting: Family Medicine

## 2017-01-18 ENCOUNTER — Encounter: Payer: Self-pay | Admitting: Family Medicine

## 2017-01-18 ENCOUNTER — Ambulatory Visit (INDEPENDENT_AMBULATORY_CARE_PROVIDER_SITE_OTHER): Payer: BLUE CROSS/BLUE SHIELD | Admitting: Family Medicine

## 2017-01-18 DIAGNOSIS — I10 Essential (primary) hypertension: Secondary | ICD-10-CM

## 2017-01-18 DIAGNOSIS — L568 Other specified acute skin changes due to ultraviolet radiation: Secondary | ICD-10-CM

## 2017-01-18 MED ORDER — SPIRONOLACTONE 25 MG PO TABS: 25.0000 mg | ORAL_TABLET | Freq: Every day | ORAL | 11 refills | Status: DC

## 2017-01-18 NOTE — Patient Instructions (Addendum)
Stop HCTZ. Retry spirnolactone.  Call if causing side effects or photosensitivity not resolved.  Return in 2 weeks for lab check for BMET.

## 2017-01-18 NOTE — Assessment & Plan Note (Signed)
Good control. Pt does not want other med other than diuretic. She does not feel comfortable stopping BP med altogether. Cannot tolerate hypersensitivity. Will retry trial of spironolactone low dose.  Recheck K and Cr in 2 weeks.

## 2017-01-18 NOTE — Progress Notes (Signed)
   Subjective:    Patient ID: Michelle Gill, female    DOB: 01/12/1951, 66 y.o.   MRN: [id number]    HPI  66 year old female presents for med management.   1. HTN: She is having severe photosensitivity that she believes is from her HCTZ.  Has  Had SE to lisinopril, spirolactone and maxide.  She tried at night.. No different BP Readings from Last 3 Encounters:  01/18/17 100/60  11/07/16 100/72  09/22/16 120/70  She is working on low salt diet.  2. Has noted firm lesion in left forearm, nonpainful, no redness. No cahnge in szie.  Ongoing 2-3 months.   3. Wants to review DEXA. Not back yet.  Review of Systems  Constitutional: Negative for fatigue and fever.  HENT: Negative for ear pain.   Eyes: Negative for pain.  Respiratory: Negative for chest tightness and shortness of breath.   Cardiovascular: Negative for chest pain, palpitations and leg swelling.  Gastrointestinal: Negative for abdominal pain.  Genitourinary: Negative for dysuria.       Objective:   Physical Exam  Constitutional: Vital signs are normal. She appears well-developed and well-nourished. She is cooperative.  Non-toxic appearance. She does not appear ill. No distress.  HENT:  Head: Normocephalic.  Right Ear: Hearing, tympanic membrane, external ear and ear canal normal. Tympanic membrane is not erythematous, not retracted and not bulging.  Left Ear: Hearing, tympanic membrane, external ear and ear canal normal. Tympanic membrane is not erythematous, not retracted and not bulging.  Nose: No mucosal edema or rhinorrhea. Right sinus exhibits no maxillary sinus tenderness and no frontal sinus tenderness. Left sinus exhibits no maxillary sinus tenderness and no frontal sinus tenderness.  Mouth/Throat: Uvula is midline, oropharynx is clear and moist and mucous membranes are normal.  Eyes: Conjunctivae, EOM and lids are normal. Pupils are equal, round, and reactive to light. Lids are everted and swept, no foreign  bodies found.  Neck: Trachea normal and normal range of motion. Neck supple. Carotid bruit is not present. No thyroid mass and no thyromegaly present.  Cardiovascular: Normal rate, regular rhythm, S1 normal, S2 normal, normal heart sounds, intact distal pulses and normal pulses.  Exam reveals no gallop and no friction rub.   No murmur heard. Pulmonary/Chest: Effort normal and breath sounds normal. No tachypnea. No respiratory distress. She has no decreased breath sounds. She has no wheezes. She has no rhonchi. She has no rales.  Abdominal: Soft. Normal appearance and bowel sounds are normal. There is no tenderness.  Musculoskeletal:  Firm non tender nodule on right  Radius likely bone spur, non mobile  Neurological: She is alert.  Skin: Skin is warm, dry and intact. No rash noted.  No skin cahnges.. She has noted darkness in ahnds and feet.  Psychiatric: Her speech is normal and behavior is normal. Judgment and thought content normal. Her mood appears not anxious. Cognition and memory are normal. She does not exhibit a depressed mood.          Assessment & Plan:

## 2017-01-18 NOTE — Progress Notes (Signed)
Pre visit review using our clinic review tool, if applicable. No additional management support is needed unless otherwise documented below in the visit note. 

## 2017-01-18 NOTE — Assessment & Plan Note (Signed)
Offered derm referral.. Pt feels due to HCTZ. Will try change to spironolactone as this is only duretic with this NOT listed as SE.

## 2017-02-01 ENCOUNTER — Telehealth: Payer: Self-pay | Admitting: Family Medicine

## 2017-02-01 DIAGNOSIS — I10 Essential (primary) hypertension: Secondary | ICD-10-CM

## 2017-02-01 NOTE — Telephone Encounter (Signed)
-----   Message from Ellamae Sia sent at 01/25/2017  3:36 PM EDT ----- Regarding: Lab orders for Tuesday, 5.29.18 Lab orders for 2 week labs

## 2017-02-02 ENCOUNTER — Other Ambulatory Visit (INDEPENDENT_AMBULATORY_CARE_PROVIDER_SITE_OTHER): Payer: BLUE CROSS/BLUE SHIELD

## 2017-02-02 DIAGNOSIS — I10 Essential (primary) hypertension: Secondary | ICD-10-CM

## 2017-02-02 DIAGNOSIS — Z1159 Encounter for screening for other viral diseases: Secondary | ICD-10-CM

## 2017-02-02 LAB — BASIC METABOLIC PANEL
BUN: 13 mg/dL (ref 6–23)
CHLORIDE: 107 meq/L (ref 96–112)
CO2: 29 mEq/L (ref 19–32)
Calcium: 10.2 mg/dL (ref 8.4–10.5)
Creatinine, Ser: 0.7 mg/dL (ref 0.40–1.20)
GFR: 107.08 mL/min (ref >60.00)
Glucose, Bld: 97 mg/dL (ref 70–99)
POTASSIUM: 4.5 meq/L (ref 3.5–5.1)
SODIUM: 142 meq/L (ref 135–145)

## 2017-02-03 LAB — HEPATITIS C ANTIBODY: HCV Ab: NEGATIVE

## 2017-02-18 ENCOUNTER — Encounter: Payer: Self-pay | Admitting: Family Medicine

## 2017-02-18 ENCOUNTER — Ambulatory Visit (INDEPENDENT_AMBULATORY_CARE_PROVIDER_SITE_OTHER): Payer: BLUE CROSS/BLUE SHIELD | Admitting: Family Medicine

## 2017-02-18 VITALS — BP 117/70 | HR 80 | Temp 98.3°F | Ht 63.5 in

## 2017-02-18 DIAGNOSIS — L600 Ingrowing nail: Secondary | ICD-10-CM | POA: Diagnosis not present

## 2017-02-18 DIAGNOSIS — M810 Age-related osteoporosis without current pathological fracture: Secondary | ICD-10-CM

## 2017-02-18 NOTE — Patient Instructions (Addendum)
Earl Lagos will call you regarding prolia.  Call if redness or discharge occurring with toe.  Can insert cotton under toe nail as recommended to redirect toenail growth.

## 2017-02-18 NOTE — Progress Notes (Signed)
   Subjective:    Patient ID: Michelle Gill, female    DOB: May 15, 1951, 66 y.o.   MRN: [id number]  HPI    66 year old female present with new onset left great toeNAIL pain. Nail person, clipped it to closely 2 weeks ago.  She reports soreness  In left medial nail edge. No redness, no discharge and no heat.  Has been treating with alcohol and neosporin.  Now better.  She is interested in reviewing recent DEXA results. Given SE to evista and SE fosamax. Candidate for forteo or prolia.   Anxiety, generalized.. Intermittant.. Has used 30 tabs in 7-8 months. She needs refill given under stress with building and selling home.   When found out she would have to drug test.. She states she does not  Want to do UDS on record.    Review of Systems  Constitutional: Negative for fatigue and fever.  HENT: Negative for ear pain.   Eyes: Negative for pain.  Respiratory: Negative for chest tightness and shortness of breath.   Cardiovascular: Negative for chest pain, palpitations and leg swelling.  Gastrointestinal: Negative for abdominal pain.  Genitourinary: Negative for dysuria.       Objective:   Physical Exam  Constitutional: Vital signs are normal. She appears well-developed and well-nourished. She is cooperative.  Non-toxic appearance. She does not appear ill. No distress.  HENT:  Head: Normocephalic.  Right Ear: Hearing, tympanic membrane, external ear and ear canal normal. Tympanic membrane is not erythematous, not retracted and not bulging.  Left Ear: Hearing, tympanic membrane, external ear and ear canal normal. Tympanic membrane is not erythematous, not retracted and not bulging.  Nose: No mucosal edema or rhinorrhea. Right sinus exhibits no maxillary sinus tenderness and no frontal sinus tenderness. Left sinus exhibits no maxillary sinus tenderness and no frontal sinus tenderness.  Mouth/Throat: Uvula is midline, oropharynx is clear and moist and mucous membranes are normal.    Eyes: Conjunctivae, EOM and lids are normal. Pupils are equal, round, and reactive to light. Lids are everted and swept, no foreign bodies found.  Neck: Trachea normal and normal range of motion. Neck supple. Carotid bruit is not present. No thyroid mass and no thyromegaly present.  Cardiovascular: Normal rate, regular rhythm, S1 normal, S2 normal, normal heart sounds, intact distal pulses and normal pulses.  Exam reveals no gallop and no friction rub.   No murmur heard. Pulmonary/Chest: Effort normal and breath sounds normal. No tachypnea. No respiratory distress. She has no decreased breath sounds. She has no wheezes. She has no rhonchi. She has no rales.  Abdominal: Soft. Normal appearance and bowel sounds are normal. There is no tenderness.  Neurological: She is alert.  Skin: Skin is warm, dry and intact. No rash noted.  Slightly ingrown toenail on left great toe.. No redness, pain or swelling  Psychiatric: Her speech is normal and behavior is normal. Judgment and thought content normal. Her mood appears not anxious. Cognition and memory are normal. She does not exhibit a depressed mood.          Assessment & Plan:

## 2017-02-19 NOTE — Assessment & Plan Note (Addendum)
Resolved. Discussed preventative methods for  Avoiding further issue.

## 2017-02-19 NOTE — Assessment & Plan Note (Signed)
Discussed her options in detail, discussed SE and course of treatment.  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

## 2017-02-22 ENCOUNTER — Telehealth: Payer: Self-pay | Admitting: *Deleted

## 2017-02-22 DIAGNOSIS — M810 Age-related osteoporosis without current pathological fracture: Secondary | ICD-10-CM

## 2017-02-22 NOTE — Telephone Encounter (Signed)
Information has been submitted to pts insurance for verification of benefits. Awaiting response for coverage  

## 2017-03-26 NOTE — Telephone Encounter (Signed)
Verification of benefits have been processed and an approval has been received for pts prolia injection. Pts estimated cost are appx $470. (deductible of $300 and additional out of pocket cost) This is only an estimate and cannot be confirmed until benefits are paid. Please advise pt and schedule if needed. If scheduled, once the injection is received, pls contact me back with the date it was received so that I am able to update prolia folder. Thanks  Spoke to pt and advised of out of pocket costs; agreeable to charge. PA is required for medication, but I wanted to confirm she was agreeable to charges before completion.

## 2017-03-26 NOTE — Telephone Encounter (Signed)
Dr Diona Browner, in order to complete the PA, I am needing a J-code for the pts Dx.

## 2017-03-29 NOTE — Telephone Encounter (Signed)
[id number]

## 2017-04-01 NOTE — Telephone Encounter (Signed)
Spoke to [employer] and provided j code for [place]. Per [employer], [place] is only required if pt is out of network or if injection is for lime disease. Awaiting a call from alternate department who is verifying that Dr Diona Browner is in network as she is [place] of state.

## 2017-04-02 NOTE — Addendum Note (Signed)
Addended by: Eliezer Lofts E on: 04/02/2017 04:58 PM   Modules accepted: Orders

## 2017-04-02 NOTE — Telephone Encounter (Signed)
Ordered calcium

## 2017-04-02 NOTE — Telephone Encounter (Signed)
Spoke to pt and advised. Prolia injection scheduled.  Can you please order a Ca lab for pts 7/30 appt?

## 2017-04-02 NOTE — Telephone Encounter (Signed)
Spoke to [place] and completed prior auth. Case ID# [id number] and is valid from 04/02/17-04/02/18.

## 2017-04-05 ENCOUNTER — Other Ambulatory Visit (INDEPENDENT_AMBULATORY_CARE_PROVIDER_SITE_OTHER): Payer: BLUE CROSS/BLUE SHIELD

## 2017-04-05 DIAGNOSIS — M810 Age-related osteoporosis without current pathological fracture: Secondary | ICD-10-CM

## 2017-04-05 LAB — CALCIUM: Calcium: 10 mg/dL (ref 8.4–10.5)

## 2017-04-14 ENCOUNTER — Ambulatory Visit (INDEPENDENT_AMBULATORY_CARE_PROVIDER_SITE_OTHER): Payer: BLUE CROSS/BLUE SHIELD | Admitting: *Deleted

## 2017-04-14 DIAGNOSIS — M810 Age-related osteoporosis without current pathological fracture: Secondary | ICD-10-CM

## 2017-04-14 MED ORDER — DENOSUMAB 60 MG/ML ~~LOC~~ SOLN
60.0000 mg | Freq: Once | SUBCUTANEOUS | Status: AC
  Administered 2017-04-14: 60 mg via SUBCUTANEOUS

## 2017-05-14 DIAGNOSIS — H25813 Combined forms of age-related cataract, bilateral: Secondary | ICD-10-CM | POA: Diagnosis not present

## 2017-05-14 DIAGNOSIS — H04123 Dry eye syndrome of bilateral lacrimal glands: Secondary | ICD-10-CM | POA: Diagnosis not present

## 2017-05-16 DIAGNOSIS — J019 Acute sinusitis, unspecified: Secondary | ICD-10-CM | POA: Diagnosis not present

## 2017-05-27 DIAGNOSIS — J0140 Acute pansinusitis, unspecified: Secondary | ICD-10-CM | POA: Diagnosis not present

## 2017-06-25 ENCOUNTER — Ambulatory Visit (INDEPENDENT_AMBULATORY_CARE_PROVIDER_SITE_OTHER): Payer: BLUE CROSS/BLUE SHIELD

## 2017-06-25 DIAGNOSIS — Z23 Encounter for immunization: Secondary | ICD-10-CM | POA: Diagnosis not present

## 2017-07-20 DIAGNOSIS — M1711 Unilateral primary osteoarthritis, right knee: Secondary | ICD-10-CM | POA: Diagnosis not present

## 2017-07-20 DIAGNOSIS — M25551 Pain in right hip: Secondary | ICD-10-CM | POA: Diagnosis not present

## 2017-07-20 DIAGNOSIS — M1712 Unilateral primary osteoarthritis, left knee: Secondary | ICD-10-CM | POA: Diagnosis not present

## 2017-08-12 ENCOUNTER — Other Ambulatory Visit: Payer: Self-pay | Admitting: Family Medicine

## 2017-09-09 ENCOUNTER — Telehealth: Payer: Self-pay | Admitting: Family Medicine

## 2017-09-09 NOTE — Telephone Encounter (Signed)
Pt requesting a change in her bp medication. Please advise.

## 2017-09-09 NOTE — Telephone Encounter (Signed)
Copied from [place]. Topic: Quick Communication - Rx Refill/Question >> Sep 09, 2017  8:38 AM Arletha Grippe wrote: Has the patient contacted their pharmacy? Yes.     (Agent: If no, request that the patient contact the pharmacy for the refill.)   Preferred Pharmacy (with phone number or street name): pt read that [place] can cause cancer, and would like to change her bp medicine back to spirinictole that she was on before. Pt has 2 hctz left. [facility] [place] cb # is [phone]    Agent: Please be advised that RX refills may take up to 3 business days. We ask that you follow-up with your pharmacy.

## 2017-09-10 MED ORDER — SPIRONOLACTONE 25 MG PO TABS: 25.0000 mg | ORAL_TABLET | Freq: Every day | ORAL | 11 refills | Status: DC

## 2017-09-10 NOTE — Telephone Encounter (Signed)
There is no black box warning  for cancer with HCTZ ( meaning FDA has not noted a  concern in this area with extensive studies).   If she still wished to switch.. Let me know.

## 2017-09-10 NOTE — Telephone Encounter (Signed)
Mrs. Casady notified as instructed by telephone.   She still wishing to switch back to spironolactone.

## 2017-09-28 ENCOUNTER — Telehealth: Payer: Self-pay | Admitting: *Deleted

## 2017-09-28 NOTE — Telephone Encounter (Signed)
Information has been submitted to pts insurance for verification of benefits. Awaiting response for coverage  

## 2017-10-12 NOTE — Telephone Encounter (Signed)
Spoke to pt who states she is not wanting to proceed with prolia. She states she has gained 10-12lbs which she attributes to the injection. She also states she received a bill from [place], for $2100+. I advised pt we had completed the PA in July 2018 and was given a PA approval number which I provided the pt with. She will contact billing directly to resolve

## 2017-10-12 NOTE — Telephone Encounter (Signed)
Verification of benefits have been processed and an approval has been received for pts prolia injection. Pts estimated cost are appx $260. This is only an estimate and cannot be confirmed until benefits are paid. Please advise pt and schedule if needed. If scheduled, once the injection is received, pls contact me back with the date it was received so that I am able to update prolia folder. thanks  

## 2018-02-04 DIAGNOSIS — J0140 Acute pansinusitis, unspecified: Secondary | ICD-10-CM | POA: Diagnosis not present

## 2018-02-04 DIAGNOSIS — M545 Low back pain: Secondary | ICD-10-CM | POA: Diagnosis not present

## 2018-02-04 DIAGNOSIS — M25551 Pain in right hip: Secondary | ICD-10-CM | POA: Diagnosis not present

## 2018-02-04 DIAGNOSIS — K219 Gastro-esophageal reflux disease without esophagitis: Secondary | ICD-10-CM | POA: Diagnosis not present

## 2018-02-07 ENCOUNTER — Other Ambulatory Visit: Payer: Self-pay | Admitting: *Deleted

## 2018-02-07 MED ORDER — ALBUTEROL SULFATE HFA 108 (90 BASE) MCG/ACT IN AERS: 2.0000 | INHALATION_SPRAY | RESPIRATORY_TRACT | 2 refills | Status: DC | PRN

## 2018-03-08 ENCOUNTER — Encounter: Payer: Self-pay | Admitting: *Deleted

## 2018-03-09 ENCOUNTER — Encounter (INDEPENDENT_AMBULATORY_CARE_PROVIDER_SITE_OTHER): Payer: Self-pay

## 2018-03-09 ENCOUNTER — Encounter: Payer: Self-pay | Admitting: Family Medicine

## 2018-03-09 ENCOUNTER — Ambulatory Visit (INDEPENDENT_AMBULATORY_CARE_PROVIDER_SITE_OTHER): Payer: BLUE CROSS/BLUE SHIELD | Admitting: Family Medicine

## 2018-03-09 VITALS — BP 110/72 | HR 76 | Temp 98.4°F | Ht 63.5 in | Wt 136.0 lb

## 2018-03-09 DIAGNOSIS — M810 Age-related osteoporosis without current pathological fracture: Secondary | ICD-10-CM

## 2018-03-09 DIAGNOSIS — W57XXXA Bitten or stung by nonvenomous insect and other nonvenomous arthropods, initial encounter: Secondary | ICD-10-CM

## 2018-03-09 NOTE — Assessment & Plan Note (Signed)
I do not feel prolia cause weight gain. Possibly the evista she was on in past and change caused it.  Will re-eval bone density.   If worse.. Consider endo referral.. Or miclacin as other med caused SE.  Recommend weight bearing exercise, calcium in diet and vit D supplement 400 IU 1-2 times daily.

## 2018-03-09 NOTE — Progress Notes (Signed)
   Subjective:    Patient ID: Michelle Gill, female    DOB: 10/03/1950, 67 y.o.   MRN: [id number]  HPI 67 year old female presents to discuss osteoporosis treatments.  She has also noted tick bite behind ear on right.  She has had some pubic bone pain.  DEXA   2013 on no med T-3.0 spine 2016 on Evista T -2.6  Femoral neck Pt intolerant of bisphosphonates (cramps to actonel, scratchy throat to alendronate), SE now to evista. She is hesitant about prolia or forteo.       2018 T-3.8 in spine.. Significant worsening from 2016   Started prolia in 2018.  Today she reports she does not want to continue prolia as she feels it has caused 10-12 lbs of weight gains as well as she receive an bill for the medication.  Wt Readings from Last 3 Encounters:  03/09/18 136 lb (61.7 kg)  01/18/17 123 lb 8 oz (56 kg)  11/07/16 120 lb (54.4 kg)   Tick bite: occured behind leftt ear lobe. Still has nodule on ear lobe.  Occured 1 week ago. Tick was on for several hours. Not sure what type of tick.  Review of Systems  Constitutional: Negative for fatigue and fever.  HENT: Negative for ear pain.   Eyes: Negative for pain.  Respiratory: Negative for chest tightness and shortness of breath.   Cardiovascular: Negative for chest pain, palpitations and leg swelling.  Gastrointestinal: Negative for abdominal pain.  Genitourinary: Negative for dysuria.       Objective:   Physical Exam  Constitutional: Vital signs are normal. She appears well-developed and well-nourished. She is cooperative.  Non-toxic appearance. She does not appear ill. No distress.  HENT:  Head: Normocephalic.  Right Ear: Hearing, tympanic membrane, external ear and ear canal normal. Tympanic membrane is not erythematous, not retracted and not bulging.  Left Ear: Hearing, tympanic membrane, external ear and ear canal normal. Tympanic membrane is not erythematous, not retracted and not bulging.  Nose: No mucosal edema or  rhinorrhea. Right sinus exhibits no maxillary sinus tenderness and no frontal sinus tenderness. Left sinus exhibits no maxillary sinus tenderness and no frontal sinus tenderness.  Mouth/Throat: Uvula is midline, oropharynx is clear and moist and mucous membranes are normal.  Eyes: Pupils are equal, round, and reactive to light. Conjunctivae, EOM and lids are normal. Lids are everted and swept, no foreign bodies found.  Neck: Trachea normal and normal range of motion. Neck supple. Carotid bruit is not present. No thyroid mass and no thyromegaly present.  Cardiovascular: Normal rate, regular rhythm, S1 normal, S2 normal, normal heart sounds, intact distal pulses and normal pulses. Exam reveals no gallop and no friction rub.  No murmur heard. Pulmonary/Chest: Effort normal and breath sounds normal. No tachypnea. No respiratory distress. She has no decreased breath sounds. She has no wheezes. She has no rhonchi. She has no rales.  Abdominal: Soft. Normal appearance and bowel sounds are normal. There is no tenderness.  Neurological: She is alert.  Skin: Skin is warm, dry and intact. No rash noted.  Psychiatric: Her speech is normal and behavior is normal. Judgment and thought content normal. Her mood appears not anxious. Cognition and memory are normal. She does not exhibit a depressed mood.          Assessment & Plan:

## 2018-03-09 NOTE — Assessment & Plan Note (Signed)
Low risk for transmission of bacteria.  No testing recommended at 1 week and pt asymptomatic.  If symptoms occur make appt for assessment and testing as well as treatment.

## 2018-03-09 NOTE — Patient Instructions (Addendum)
Please stop at the front desk to set up referral.  Recommend weight bearing exercise, calcium in diet and vit D supplement 400 IU 1-2 times daily.  be alert for tick borne illness symptoms and call if occur in 1 week.

## 2018-03-16 ENCOUNTER — Emergency Department (HOSPITAL_COMMUNITY)
Admission: EM | Admit: 2018-03-16 | Discharge: 2018-03-16 | Disposition: A | Discharge status: Home / Self Care | Payer: BLUE CROSS/BLUE SHIELD | Attending: Emergency Medicine | Admitting: Emergency Medicine

## 2018-03-16 ENCOUNTER — Other Ambulatory Visit: Payer: Self-pay

## 2018-03-16 ENCOUNTER — Encounter (HOSPITAL_COMMUNITY): Payer: Self-pay

## 2018-03-16 DIAGNOSIS — R55 Syncope and collapse: Secondary | ICD-10-CM | POA: Diagnosis not present

## 2018-03-16 DIAGNOSIS — Z79899 Other long term (current) drug therapy: Secondary | ICD-10-CM | POA: Diagnosis not present

## 2018-03-16 DIAGNOSIS — J45909 Unspecified asthma, uncomplicated: Secondary | ICD-10-CM | POA: Insufficient documentation

## 2018-03-16 DIAGNOSIS — R42 Dizziness and giddiness: Secondary | ICD-10-CM | POA: Diagnosis not present

## 2018-03-16 DIAGNOSIS — I10 Essential (primary) hypertension: Secondary | ICD-10-CM | POA: Diagnosis not present

## 2018-03-16 LAB — BASIC METABOLIC PANEL
ANION GAP: 6 (ref 5–15)
BUN: 11 mg/dL (ref 8–23)
CO2: 24 mmol/L (ref 22–32)
CREATININE: 0.7 mg/dL (ref 0.44–1.00)
Calcium: 9.2 mg/dL (ref 8.9–10.3)
Chloride: 109 mmol/L (ref 98–111)
GFR calc Af Amer: >60 mL/min (ref >60)
GFR calc non Af Amer: >60 mL/min (ref >60)
GLUCOSE: 111 mg/dL — AB (ref 70–99)
Potassium: 3.7 mmol/L (ref 3.5–5.1)
Sodium: 139 mmol/L (ref 135–145)

## 2018-03-16 LAB — CBC
HCT: 37.5 % (ref 36.0–46.0)
HEMOGLOBIN: 11.3 g/dL — AB (ref 12.0–15.0)
MCH: 23.3 pg — AB (ref 26.0–34.0)
MCHC: 30.1 g/dL (ref 30.0–36.0)
MCV: 77.3 fL — ABNORMAL LOW (ref 78.0–100.0)
Platelets: 157 10*3/uL (ref 150–400)
RBC: 4.85 MIL/uL (ref 3.87–5.11)
RDW: 13.9 % (ref 11.5–15.5)
WBC: 6 10*3/uL (ref 4.0–10.5)

## 2018-03-16 LAB — URINALYSIS, ROUTINE W REFLEX MICROSCOPIC
BILIRUBIN URINE: NEGATIVE
Glucose, UA: NEGATIVE mg/dL
HGB URINE DIPSTICK: NEGATIVE
Ketones, ur: NEGATIVE mg/dL
Leukocytes, UA: NEGATIVE
Nitrite: NEGATIVE
PROTEIN: NEGATIVE mg/dL
SPECIFIC GRAVITY, URINE: 1.005 (ref 1.005–1.030)
pH: 8 (ref 5.0–8.0)

## 2018-03-16 LAB — CBG MONITORING, ED: GLUCOSE-CAPILLARY: 109 mg/dL — AB (ref 70–99)

## 2018-03-16 MED ORDER — SODIUM CHLORIDE 0.9 % IV BOLUS
1000.0000 mL | Freq: Once | INTRAVENOUS | Status: AC
Start: 2018-03-16 — End: 2018-03-16
  Administered 2018-03-16: 1000 mL via INTRAVENOUS

## 2018-03-16 NOTE — ED Notes (Signed)
Pt provided with food and drink.

## 2018-03-16 NOTE — ED Provider Notes (Signed)
[facility] EMERGENCY DEPARTMENT Provider Note   CSN: [id number] Arrival date & time: 03/16/18  1130     History   Chief Complaint Chief Complaint  Patient presents with  . Near Syncope    HPI Michelle Gill is a 67 y.o. female with history of iron deficiency anemia, anxiety, asthma, hypertension presents for evaluation of acute onset, per aggressively worsening generalized fatigue and lightheadedness for 3 weeks.  She states that 2 weeks ago she noticed a tick bite to the left ear.  She states that it could not have been on there longer than a few hours and she was able to remove it successfully.  She is being followed by her primary care physician who will be "drawing lab work tomorrow ".  Over the past 2 to 3 weeks she states that she has been intermittently lightheaded with activity, denies syncope.  4 days ago she states that she had a "stomach bug " In which she had intermittent generalized crampy abdominal pain and multiple episodes of nonbloody nonbilious emesis.  She has had watery nonbloody diarrhea for the past 3 days.Notes subjective fevers last night but did not take her temperature.  Denies significant abdominal pain, shortness of breath, or chest pain at this time.  She denies melena, hematochezia, hematuria.  She has not tried anything for her symptoms.  Yesterday she states that she was attempting yardwork and when she went to the house she was very lightheaded and felt as though she was going to pass out.  This improved with rest.  The history is provided by the patient.    Past Medical History:  Diagnosis Date  . Anal fissure   . Anemia   . Anxiety   . Asthma    exercise induced  . Hypertension   . Pneumonia 2006    Patient Active Problem List   Diagnosis Date Noted  . Tick bite 03/09/2018  . Ingrown right greater toenail 02/18/2017  . Laryngopharyngeal reflux (LPR) 01/01/2017  . Adjustment disorder with mixed anxiety and depressed mood  09/22/2016  . Breast pain, left 07/24/2016  . Family history of kidney disease in brother 07/24/2014  . Vitamin D deficiency 07/24/2014  . Generalized anxiety disorder 06/15/2014  . Allergic rhinitis 04/25/2013  . Exercise-induced asthma 04/25/2013  . Osteoarthritis 04/25/2013  . Osteoporosis 04/25/2013  . Benign essential HTN 04/25/2013  . Photosensitivity 04/25/2013  . Anemia, iron deficiency 03/30/2013    Past Surgical History:  Procedure Laterality Date  . 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 SINUS IRRIGATION    . VAGINAL HYSTERECTOMY  1986   partial     OB History   None      Home Medications    Prior to Admission medications   Medication Sig Start Date End Date Taking? Authorizing Provider  albuterol (VENTOLIN HFA) 108 (90 Base) MCG/ACT inhaler Inhale 2 puffs into the lungs every 4 (four) hours as needed. 02/07/18  Yes Bedsole, Amy E, MD  cholecalciferol (VITAMIN D) 1000 UNITS tablet Take 2,000 Units by mouth daily.    Yes [provider]  esomeprazole (NEXIUM) 40 MG capsule Take 40 mg by mouth as needed. For acid reflux   Yes [provider]  ferrous sulfate 324 (65 FE) MG TBEC Take 1 tablet by mouth daily.   Yes [provider]  omega-3 acid ethyl esters (LOVAZA) 1 g capsule Take by mouth daily.   Yes [provider]  spironolactone (  ALDACTONE) 25 MG tablet Take 1 tablet (25 mg total) by mouth daily. 09/10/17  Yes Bedsole, Amy E, MD  triamcinolone (NASACORT) 55 MCG/ACT AERO nasal inhaler PLACE 2 SPRAYS IN EACH NOSTRIL ONCE A DAY FOR ALLERGIES Patient taking differently: PLACE 2 SPRAYS IN EACH NOSTRIL ONCE A DAY FOR ALLERGIES as needed 03/05/15  Yes Bedsole, Amy E, MD  zinc gluconate 50 MG tablet Take 50 mg by mouth daily.   Yes [provider]    Family History Family History  Problem Relation Age of Onset  . Diabetes Brother   . Arthritis Brother   . Gout Brother   . Diabetes Sister        pre diabetic  .  Arthritis Sister   . Gout Sister   . Alzheimer's disease Mother   . Hyperlipidemia Mother   . Hypertension Mother   . Arthritis Mother   . Gout Mother   . Congestive Heart Failure Mother   . Kidney disease Mother   . Cancer Father 19       stomach cancer  . Alcohol abuse Father   . Alcohol abuse Brother   . Colon cancer Neg Hx     Social History Social History   Tobacco Use  . Smoking status: Never Smoker  . Smokeless tobacco: Never Used  Substance Use Topics  . Alcohol use: No  . Drug use: No     Allergies   Almond (diagnostic); Morphine and related; and Naproxen   Review of Systems Review of Systems  Constitutional: Positive for fatigue and fever (subjective). Negative for chills.  Eyes: Negative for photophobia and visual disturbance.  Respiratory: Negative for shortness of breath.   Cardiovascular: Negative for chest pain.  Gastrointestinal: Positive for abdominal pain, diarrhea, nausea and vomiting.  Neurological: Positive for weakness (generalized) and light-headedness. Negative for syncope and headaches.  All other systems reviewed and are negative.    Physical Exam Updated Vital Signs BP 116/65   Pulse 65   Temp 98.3 F (36.8 C) (Oral)   Resp (!) 8   Ht 5\' 5"  (1.651 m)   Wt 61.7 kg (136 lb)   SpO2 100%   BMI 22.63 kg/m   Physical Exam  Constitutional: She appears well-developed and well-nourished. No distress.  HENT:  Head: Normocephalic and atraumatic.  Small scab to the helix of the left ear.  No surrounding erythema or induration.  No retained tick/take head noted.  Eyes: Conjunctivae are normal. Right eye exhibits no discharge. Left eye exhibits no discharge.  Neck: No JVD present. No tracheal deviation present.  Cardiovascular: Normal rate, regular rhythm, normal heart sounds and intact distal pulses.  2+ radial and DP/PT pulses bilaterally, Homans sign absent bilaterally, no lower extremity edema, no palpable cords, compartments are  soft   Pulmonary/Chest: Effort normal and breath sounds normal. No stridor. No respiratory distress. She has no wheezes. She has no rales. She exhibits no tenderness.  Abdominal: Soft. Bowel sounds are normal. She exhibits no distension. There is no tenderness. There is no guarding.  Musculoskeletal: She exhibits no edema.  Neurological: She is alert. No cranial nerve deficit.  Fluent speech with no evidence of dysarthria or aphasia, no facial droop, sensation intact to soft touch of extremities.  5/5 strength of BUE and BLE major muscle groups.  Cranial nerves II through XII tested and intact.  Romberg sign absent, no pronator drift.  Skin: Skin is warm and dry. No erythema.  Psychiatric: She has a normal mood and  affect. Her behavior is normal.  Nursing note and vitals reviewed.    ED Treatments / Results  Labs (all labs ordered are listed, but only abnormal results are displayed) Labs Reviewed  BASIC METABOLIC PANEL - Abnormal; Notable for the following components:      Result Value   Glucose, Bld 111 (*)    All other components within normal limits  CBC - Abnormal; Notable for the following components:   Hemoglobin 11.3 (*)    MCV 77.3 (*)    MCH 23.3 (*)    All other components within normal limits  URINALYSIS, ROUTINE W REFLEX MICROSCOPIC - Abnormal; Notable for the following components:   Color, Urine STRAW (*)    All other components within normal limits  CBG MONITORING, ED - Abnormal; Notable for the following components:   Glucose-Capillary 109 (*)    All other components within normal limits  TSH    EKG EKG Interpretation  Date/Time:  Wednesday March 16 2018 11:40:54 EDT Ventricular Rate:  68 PR Interval:    QRS Duration: 79 QT Interval:  407 QTC Calculation: 433 R Axis:   49 Text Interpretation:  Sinus rhythm slight change in slope of the st segments in II, III Otherwise no significant change Confirmed by Deno Etienne [phone]) on 03/16/2018 12:07:18  PM   Radiology No results found.  Procedures Procedures (including critical care time)  Medications Ordered in ED Medications  sodium chloride 0.9 % bolus 1,000 mL (0 mLs Intravenous Stopped 03/16/18 1341)     Initial Impression / Assessment and Plan / ED Course  I have reviewed the triage vital signs and the nursing notes.  Pertinent labs & imaging results that were available during my care of the patient were reviewed by me and considered in my medical decision making (see chart for details).     Patient presents with generalized fatigue and intermittent lightheadedness weeks.  She is afebrile, vital signs are stable.  She is nontoxic in appearance.  No focal neurologic deficits.  EKG shows slight change in the slope of the ST segments in leads II and III but otherwise no significant changes and nothing concerning for ischemic changes or arrhythmia.  Doubt ACS/MI in the absence of chest pain.  Lab work reviewed by me shows mild anemia, no metabolic derangements.  No leukocytosis.  UA is not concerning for UTI or nephrolithiasis.  Will obtain TSH for patient to follow-up with her PCP.  She has an appointment tomorrow for lab work and further work-up of a tick bite noticed 2 weeks ago.  Does not require antibiotic prophylaxis at this time.  No concern for [facility] spotted fever in the absence of fever or rash.  No concern for CVA, ICH, or other acute intracranial abnormality.  She is amatory without difficulty, no hypoxia or tachycardia when ambulated in the ED on pulse oximetry.  She is not orthostatic.  She was given an IV fluid bolus while in the ED and states that she feels much better.  She feels comfortable with discharge home with follow-up with her PCP as scheduled tomorrow.  Discussed strict ED return precautions.  Patient and patient's significant other verbalized understanding of and agreement with plan and patient is stable for discharge home at this time.  Final Clinical  Impressions(s) / ED Diagnoses   Final diagnoses:  Near syncope    ED Discharge Orders    None       Renita Papa, [place] 03/16/18 1522  Deno Etienne, DO 03/16/18 1534

## 2018-03-16 NOTE — ED Notes (Signed)
Pt ambulated with unsteady gait when first getting off stretcher. Pt then after had steady gait back to room. Pt oxygen level stayed at 100% while ambulating.

## 2018-03-16 NOTE — Discharge Instructions (Signed)
Your work-up today was reassuring.  Drink plenty of water and get plenty of rest for the next few days.  Go to your primary care physician tomorrow as scheduled for review of your work-up and to get blood work.  Return to the emergency department immediately for any concerning signs or symptoms develop such as passing out, vision changes, weakness to one side of the body, chest pains, or difficulty walking.

## 2018-03-16 NOTE — ED Triage Notes (Signed)
Pt reports tiredness and weakness intermittently for 3 weeks.  Pt reports for the last two days attempting yard work and unable to tolerate the heat for no longer than 77min.  Pt had a near syncopal episode today after coming inside with associated diaphoresis and severe fatigue.

## 2018-03-17 ENCOUNTER — Encounter: Payer: Self-pay | Admitting: Family Medicine

## 2018-03-17 ENCOUNTER — Ambulatory Visit: Payer: BLUE CROSS/BLUE SHIELD | Admitting: Family Medicine

## 2018-03-17 VITALS — BP 120/72 | HR 92 | Temp 98.7°F | Ht 65.0 in | Wt 136.2 lb

## 2018-03-17 DIAGNOSIS — R5383 Other fatigue: Secondary | ICD-10-CM | POA: Diagnosis not present

## 2018-03-17 DIAGNOSIS — D509 Iron deficiency anemia, unspecified: Secondary | ICD-10-CM

## 2018-03-17 DIAGNOSIS — S00469S Insect bite (nonvenomous) of unspecified ear, sequela: Secondary | ICD-10-CM

## 2018-03-17 DIAGNOSIS — R9431 Abnormal electrocardiogram [ECG] [EKG]: Secondary | ICD-10-CM | POA: Diagnosis not present

## 2018-03-17 DIAGNOSIS — W57XXXS Bitten or stung by nonvenomous insect and other nonvenomous arthropods, sequela: Secondary | ICD-10-CM | POA: Diagnosis not present

## 2018-03-17 DIAGNOSIS — R55 Syncope and collapse: Secondary | ICD-10-CM | POA: Diagnosis not present

## 2018-03-17 DIAGNOSIS — S80861A Insect bite (nonvenomous), right lower leg, initial encounter: Secondary | ICD-10-CM | POA: Insufficient documentation

## 2018-03-17 NOTE — Assessment & Plan Note (Signed)
Likely due to GI illness... Diarrhea, emesis and heat.  No further issues.

## 2018-03-17 NOTE — Progress Notes (Signed)
Subjective:    Patient ID: Michelle Gill, female    DOB: [birth year], 67 y.o.   MRN: [id number]  HPI 67 year old female presents for ER follow up for near syncope on 7/10.. Occurred when doing yard work.  She had noted progressively worsening fatigue and lightheadedness in last 3 weeks.  Crampy abdominal pain and diarrhea x 3 days after eating something bad (ate some old bologna)... Several hours later started with diarrhea.. Had episode of feeling hot and emesis prior to feeling lightheaded.   With history of tick bite 3 weeks ago. Vitals nml in ER.  UA clear  glucose 109-111  Hg 11.3, low MCV Wbc 6  BMET nml  TSH pending  IV fluid bolus helped her feel better  EKG Sinus rhythm slight change in slope of the st segments in II, III Otherwise no significant change   Today she reports she feels tired, nausea with eating. No longer with diarrhea.  She is afraid to eat much given fear of emesis.   Water makes her feel nauseous.  Blood pressure 120/72, pulse 92, temperature 98.7 F (37.1 C), temperature source Oral, height 5\' 5"  (1.651 m), weight 136 lb 4 oz (61.8 kg), SpO2 98 %. Social History /Family History/Past Medical History reviewed in detail and updated in EMR if needed.  Review of Systems  Constitutional: Positive for fatigue. Negative for fever.  HENT: Negative for ear pain.   Eyes: Negative for photophobia and pain.  Respiratory: Negative for shortness of breath and wheezing.   Cardiovascular: Negative for chest pain and leg swelling.  Gastrointestinal: Positive for nausea. Negative for abdominal pain and vomiting.  Musculoskeletal: Positive for myalgias.       Feels flu like   always has neck stiffness  Skin: Negative for rash.  Neurological: Negative for weakness and numbness.       Objective:   Physical Exam  Constitutional: Vital signs are normal. She appears well-developed and well-nourished. She is cooperative.  Non-toxic appearance. She does not appear  ill. No distress.  HENT:  Head: Normocephalic.  Right Ear: Hearing, tympanic membrane, external ear and ear canal normal. Tympanic membrane is not erythematous, not retracted and not bulging.  Left Ear: Hearing, tympanic membrane, external ear and ear canal normal. Tympanic membrane is not erythematous, not retracted and not bulging.  Nose: No mucosal edema or rhinorrhea. Right sinus exhibits no maxillary sinus tenderness and no frontal sinus tenderness. Left sinus exhibits no maxillary sinus tenderness and no frontal sinus tenderness.  Mouth/Throat: Uvula is midline, oropharynx is clear and moist and mucous membranes are normal.  Eyes: Pupils are equal, round, and reactive to light. Conjunctivae, EOM and lids are normal. Lids are everted and swept, no foreign bodies found.  Neck: Trachea normal and normal range of motion. Neck supple. Carotid bruit is not present. No thyroid mass and no thyromegaly present.  Cardiovascular: Normal rate, regular rhythm, S1 normal, S2 normal, normal heart sounds, intact distal pulses and normal pulses. Exam reveals no gallop and no friction rub.  No murmur heard. Pulmonary/Chest: Effort normal and breath sounds normal. No tachypnea. No respiratory distress. She has no decreased breath sounds. She has no wheezes. She has no rhonchi. She has no rales.  Abdominal: Soft. Normal appearance and bowel sounds are normal. There is no tenderness.  Neurological: She is alert.  Skin: Skin is warm, dry and intact. No rash noted.  Psychiatric: Her speech is normal and behavior is normal. Judgment and thought content normal. Her  mood appears not anxious. Cognition and memory are normal. She does not exhibit a depressed mood.          Assessment & Plan:

## 2018-03-17 NOTE — Assessment & Plan Note (Signed)
Given near syncope and flu like illness... eval with labs. Not high enough pretest probability to use prophylactic antibiotics.

## 2018-03-17 NOTE — Patient Instructions (Addendum)
Please stop at the lab to have labs drawn. Please stop at the front desk to set up referral.  

## 2018-03-17 NOTE — Assessment & Plan Note (Signed)
Eval with labs for etiology.

## 2018-03-17 NOTE — Assessment & Plan Note (Signed)
On daily 325 mg iron... eval with labs.

## 2018-03-17 NOTE — Addendum Note (Signed)
Addended by: Ellamae Sia on: 03/17/2018 11:42 AM   Modules accepted: Orders

## 2018-03-17 NOTE — Assessment & Plan Note (Signed)
Doubt significant change but given age , family history and near syncope.. eval with strress test.. Treadmill.

## 2018-03-18 ENCOUNTER — Other Ambulatory Visit (INDEPENDENT_AMBULATORY_CARE_PROVIDER_SITE_OTHER): Payer: BLUE CROSS/BLUE SHIELD

## 2018-03-18 DIAGNOSIS — D509 Iron deficiency anemia, unspecified: Secondary | ICD-10-CM | POA: Diagnosis not present

## 2018-03-18 DIAGNOSIS — R5383 Other fatigue: Secondary | ICD-10-CM

## 2018-03-18 DIAGNOSIS — R55 Syncope and collapse: Secondary | ICD-10-CM

## 2018-03-18 DIAGNOSIS — S00469S Insect bite (nonvenomous) of unspecified ear, sequela: Secondary | ICD-10-CM

## 2018-03-18 DIAGNOSIS — W57XXXS Bitten or stung by nonvenomous insect and other nonvenomous arthropods, sequela: Principal | ICD-10-CM

## 2018-03-18 LAB — IBC PANEL
Iron: 105 ug/dL (ref 42–145)
SATURATION RATIOS: 36.6 % (ref 20.0–50.0)
TRANSFERRIN: 205 mg/dL — AB (ref 212.0–360.0)

## 2018-03-18 LAB — TSH: TSH: 1.03 u[IU]/mL (ref 0.35–4.50)

## 2018-03-18 LAB — T3, FREE: T3 FREE: 2.9 pg/mL (ref 2.3–4.2)

## 2018-03-18 LAB — T4, FREE: FREE T4: 0.74 ng/dL (ref 0.60–1.60)

## 2018-03-18 LAB — FERRITIN: FERRITIN: 116.7 ng/mL (ref 10.0–291.0)

## 2018-03-18 LAB — VITAMIN B12: VITAMIN B 12: 241 pg/mL (ref 211–911)

## 2018-03-18 LAB — VITAMIN D 25 HYDROXY (VIT D DEFICIENCY, FRACTURES): VITD: 33.71 ng/mL (ref 30.00–100.00)

## 2018-03-22 ENCOUNTER — Other Ambulatory Visit: Payer: Self-pay | Admitting: Emergency Medicine

## 2018-03-22 LAB — B. BURGDORFI ANTIBODIES BY WB
B BURGDORFERI IGM ABS (IB): NEGATIVE
B burgdorferi IgG Abs (IB): NEGATIVE
LYME DISEASE 23 KD IGG: NONREACTIVE
LYME DISEASE 23 KD IGM: REACTIVE — AB
LYME DISEASE 41 KD IGM: NONREACTIVE
LYME DISEASE 58 KD IGG: NONREACTIVE
LYME DISEASE 66 KD IGG: NONREACTIVE
LYME DISEASE 93 KD IGG: NONREACTIVE
Lyme Disease 18 kD IgG: NONREACTIVE
Lyme Disease 28 kD IgG: NONREACTIVE
Lyme Disease 30 kD IgG: NONREACTIVE
Lyme Disease 39 kD IgG: NONREACTIVE
Lyme Disease 39 kD IgM: NONREACTIVE
Lyme Disease 41 kD IgG: REACTIVE — AB
Lyme Disease 45 kD IgG: NONREACTIVE

## 2018-03-22 LAB — EHRLICHIA ANTIBODY PANEL
E. CHAFFEENSIS AB IGG: <1:64 {titer}
E. CHAFFEENSIS AB IGM: <1:20 {titer}

## 2018-03-22 LAB — ROCKY MTN SPOTTED FVR ABS PNL(IGG+IGM)
RMSF IGG: NOT DETECTED
RMSF IGM: NOT DETECTED

## 2018-03-22 MED ORDER — CHOLECALCIFEROL 1.25 MG (50000 UT) PO TABS: 1.0000 | ORAL_TABLET | ORAL | 0 refills | Status: DC

## 2018-03-23 ENCOUNTER — Encounter: Payer: Self-pay | Admitting: Family Medicine

## 2018-03-23 DIAGNOSIS — M810 Age-related osteoporosis without current pathological fracture: Secondary | ICD-10-CM | POA: Diagnosis not present

## 2018-03-23 DIAGNOSIS — M8589 Other specified disorders of bone density and structure, multiple sites: Secondary | ICD-10-CM | POA: Diagnosis not present

## 2018-03-23 DIAGNOSIS — Z1231 Encounter for screening mammogram for malignant neoplasm of breast: Secondary | ICD-10-CM | POA: Diagnosis not present

## 2018-03-23 LAB — HM DEXA SCAN

## 2018-03-30 ENCOUNTER — Ambulatory Visit (INDEPENDENT_AMBULATORY_CARE_PROVIDER_SITE_OTHER): Payer: BLUE CROSS/BLUE SHIELD

## 2018-03-30 DIAGNOSIS — R55 Syncope and collapse: Secondary | ICD-10-CM

## 2018-03-30 DIAGNOSIS — R9431 Abnormal electrocardiogram [ECG] [EKG]: Secondary | ICD-10-CM | POA: Diagnosis not present

## 2018-04-01 ENCOUNTER — Encounter: Payer: Self-pay | Admitting: *Deleted

## 2018-04-01 NOTE — Progress Notes (Signed)
We

## 2018-04-05 LAB — EXERCISE TOLERANCE TEST
CHL CUP MPHR: 152 {beats}/min
CSEPED: 9 min
CSEPPHR: 176 {beats}/min
Estimated workload: 10.8 METS
Exercise duration (sec): 28 s
Percent HR: 115 %
RPE: 16
Rest HR: 74 {beats}/min

## 2018-04-06 ENCOUNTER — Telehealth: Payer: Self-pay | Admitting: *Deleted

## 2018-04-06 NOTE — Telephone Encounter (Signed)
Stress test results given to patient via telephone.

## 2018-04-06 NOTE — Telephone Encounter (Signed)
Just received results today.. See result note.

## 2018-04-06 NOTE — Telephone Encounter (Signed)
Copied from [place] [id number]. Topic: Quick Communication - Other Results >> Apr 06, 2018 10:22 AM Lennox Solders wrote: Pt would like  results of her stress test. Pt had the test 03-30-18 near [facility] [place]

## 2018-04-28 ENCOUNTER — Telehealth: Payer: Self-pay | Admitting: Family Medicine

## 2018-04-28 DIAGNOSIS — M810 Age-related osteoporosis without current pathological fracture: Secondary | ICD-10-CM

## 2018-04-28 DIAGNOSIS — E559 Vitamin D deficiency, unspecified: Secondary | ICD-10-CM

## 2018-04-28 DIAGNOSIS — I10 Essential (primary) hypertension: Secondary | ICD-10-CM

## 2018-04-28 DIAGNOSIS — D508 Other iron deficiency anemias: Secondary | ICD-10-CM

## 2018-04-28 NOTE — Telephone Encounter (Signed)
-----   Message from Lendon Collar, RT sent at 04/20/2018 10:34 AM EDT ----- Regarding: Lab orders for Friday 8/23 Please enter CPE lab orders for 04/29/18. Thanks-Lauren

## 2018-04-29 ENCOUNTER — Other Ambulatory Visit (INDEPENDENT_AMBULATORY_CARE_PROVIDER_SITE_OTHER): Payer: BLUE CROSS/BLUE SHIELD

## 2018-04-29 ENCOUNTER — Telehealth: Payer: Self-pay | Admitting: Family Medicine

## 2018-04-29 DIAGNOSIS — I10 Essential (primary) hypertension: Secondary | ICD-10-CM

## 2018-04-29 LAB — COMPREHENSIVE METABOLIC PANEL
ALK PHOS: 113 U/L (ref 39–117)
ALT: 12 U/L (ref 0–35)
AST: 15 U/L (ref 0–37)
Albumin: 4.3 g/dL (ref 3.5–5.2)
BUN: 10 mg/dL (ref 6–23)
CO2: 28 meq/L (ref 19–32)
Calcium: 10.1 mg/dL (ref 8.4–10.5)
Chloride: 102 mEq/L (ref 96–112)
Creatinine, Ser: 0.73 mg/dL (ref 0.40–1.20)
GFR: 101.64 mL/min (ref >60.00)
GLUCOSE: 94 mg/dL (ref 70–99)
POTASSIUM: 4.3 meq/L (ref 3.5–5.1)
Sodium: 137 mEq/L (ref 135–145)
Total Bilirubin: 0.9 mg/dL (ref 0.2–1.2)
Total Protein: 6.9 g/dL (ref 6.0–8.3)

## 2018-04-29 LAB — LIPID PANEL
CHOL/HDL RATIO: 3
Cholesterol: 207 mg/dL — ABNORMAL HIGH (ref 0–200)
HDL: 82.8 mg/dL (ref >39.00)
LDL CALC: 114 mg/dL — AB (ref 0–99)
NONHDL: 124.64
Triglycerides: 53 mg/dL (ref 0.0–149.0)
VLDL: 10.6 mg/dL (ref 0.0–40.0)

## 2018-04-29 NOTE — Telephone Encounter (Signed)
-----   Message from Ellamae Sia sent at 04/29/2018  8:05 AM EDT ----- Regarding: add orders???? Pt wants to check vit d, cbc

## 2018-05-02 ENCOUNTER — Other Ambulatory Visit (INDEPENDENT_AMBULATORY_CARE_PROVIDER_SITE_OTHER): Payer: BLUE CROSS/BLUE SHIELD

## 2018-05-02 ENCOUNTER — Other Ambulatory Visit: Payer: Self-pay | Admitting: *Deleted

## 2018-05-02 DIAGNOSIS — E559 Vitamin D deficiency, unspecified: Secondary | ICD-10-CM

## 2018-05-02 LAB — VITAMIN D 25 HYDROXY (VIT D DEFICIENCY, FRACTURES): VITD: 77.32 ng/mL (ref 30.00–100.00)

## 2018-05-03 ENCOUNTER — Ambulatory Visit (INDEPENDENT_AMBULATORY_CARE_PROVIDER_SITE_OTHER): Payer: BLUE CROSS/BLUE SHIELD | Admitting: Family Medicine

## 2018-05-03 ENCOUNTER — Encounter: Payer: Self-pay | Admitting: Family Medicine

## 2018-05-03 VITALS — BP 120/80 | HR 78 | Temp 99.1°F | Ht 63.5 in | Wt 136.8 lb

## 2018-05-03 DIAGNOSIS — I10 Essential (primary) hypertension: Secondary | ICD-10-CM | POA: Diagnosis not present

## 2018-05-03 DIAGNOSIS — D508 Other iron deficiency anemias: Secondary | ICD-10-CM

## 2018-05-03 DIAGNOSIS — E559 Vitamin D deficiency, unspecified: Secondary | ICD-10-CM

## 2018-05-03 DIAGNOSIS — Z0000 Encounter for general adult medical examination without abnormal findings: Secondary | ICD-10-CM

## 2018-05-03 NOTE — Assessment & Plan Note (Signed)
On iron, chronic

## 2018-05-03 NOTE — Patient Instructions (Addendum)
Return in 06/2018 for lab only visit for cbc repeat Dx iron def anemia  Keep diary for lower abd pain.

## 2018-05-03 NOTE — Assessment & Plan Note (Signed)
Well controlled. Continue current medication.  

## 2018-05-03 NOTE — Assessment & Plan Note (Signed)
Resolved

## 2018-05-03 NOTE — Progress Notes (Signed)
Subjective:    Patient ID: Michelle Gill, female    DOB: [birth year], 67 y.o.   MRN: [id number]  HPI   The patient is here for annual wellness exam and preventative care.    Hypertension: Well controlled on spironolactone Using medication without problems or lightheadedness:  No further presyncopal spells Chest pain with exertion: none Edema:none Short of breath: none Average home BPs: Other issues:   Reviewed labs in detail. Diet:  good Exercise: Gym 3-4 times a week. Yard work.    Anemia.. Will re-eval in 3 months.  Social History /Family History/Past Medical History reviewed in detail and updated in EMR if needed. Blood pressure 120/80, pulse 78, temperature 99.1 F (37.3 C), temperature source Oral, height 5' 3.5" (1.613 m), weight 136 lb 12 oz (62 kg).   Review of Systems  Constitutional: Negative for fatigue and fever.  HENT: Negative for congestion.   Eyes: Negative for pain.  Respiratory: Negative for cough and shortness of breath.   Cardiovascular: Negative for chest pain, palpitations and leg swelling.  Gastrointestinal: Negative for abdominal pain.  Genitourinary: Negative for dysuria and vaginal bleeding.  Musculoskeletal: Negative for back pain.  Neurological: Negative for syncope, light-headedness and headaches.  Psychiatric/Behavioral: Negative for dysphoric mood.       Objective:   Physical Exam  Constitutional: Vital signs are normal. She appears well-developed and well-nourished. She is cooperative.  Non-toxic appearance. She does not appear ill. No distress.  HENT:  Head: Normocephalic.  Right Ear: Hearing, tympanic membrane, external ear and ear canal normal.  Left Ear: Hearing, tympanic membrane, external ear and ear canal normal.  Nose: Nose normal.  Eyes: Pupils are equal, round, and reactive to light. Conjunctivae, EOM and lids are normal. Lids are everted and swept, no foreign bodies found.  Neck: Trachea normal and normal range of  motion. Neck supple. Carotid bruit is not present. No thyroid mass and no thyromegaly present.  Cardiovascular: Normal rate, regular rhythm, S1 normal, S2 normal, normal heart sounds and intact distal pulses. Exam reveals no gallop.  No murmur heard. Pulmonary/Chest: Effort normal and breath sounds normal. No respiratory distress. She has no wheezes. She has no rhonchi. She has no rales.  Abdominal: Soft. Normal appearance and bowel sounds are normal. She exhibits no distension, no fluid wave, no abdominal bruit and no mass. There is no hepatosplenomegaly. There is no tenderness. There is no rebound, no guarding and no CVA tenderness. No hernia.  Genitourinary: Rectum normal and vagina normal. Pelvic exam was performed with patient supine. There is no rash, tenderness, lesion or injury on the right labia. There is no rash, tenderness, lesion or injury on the left labia. Right adnexum displays no mass, no tenderness and no fullness. Left adnexum displays no mass, no tenderness and no fullness.  Lymphadenopathy:    She has no cervical adenopathy.    She has no axillary adenopathy.  Neurological: She is alert. She has normal strength. No cranial nerve deficit or sensory deficit.  Skin: Skin is warm, dry and intact. No rash noted.  Psychiatric: Her speech is normal and behavior is normal. Judgment normal. Her mood appears not anxious. Cognition and memory are normal. She does not exhibit a depressed mood.          Assessment & Plan:  The patient's preventative maintenance and recommended screening tests for an annual wellness exam were reviewed in full today. Brought up to date unless services declined.  Counselled on the importance of diet, exercise, and  its role in overall health and mortality. The patient's FH and SH was reviewed, including their home life, tobacco status, and drug and alcohol status.   Mammogram  03/2018 Colonoscopy 2010, Dr. Deatra Ina, plans repeat in 2020.  Last DEXA:  03/2018 improved and almost in osteopenia range SE to evista and SE fosamax. Nervous about forteo.  PAP/DVE: no pap indicated partial hystec,  She does have some low abdominal symptoms (  Bilateral moving towards central pain over pelvic bone, occ waking her up at night, no dysuria)  no ovarian cancer in family. DVE every other year. (due this year). Vaccines:uptodate with PNA, Tdap, zoster, Flu,prevnar  Nonsmoker Hep C: done

## 2018-05-11 ENCOUNTER — Telehealth: Payer: Self-pay | Admitting: Family Medicine

## 2018-05-11 NOTE — Telephone Encounter (Signed)
I spoke with pt. Pt wants to switch back to HCTZ because since on spironolactone it is not as good a diuretic because has slight puffiness in ankle and pt feels bloated. Pt also said her diastolic BP ran in the 01'O while on HCTZ but since on Spironolactone diastolic BP is around 80.Pt is not having problem breathing. See 09/09/17 phone note about why switched to HCTZ. Pt now says she switched from the HCTZ because HCTZ could cause photosensitivity. Pt seen 05/03/18 for annual exam. [facility].

## 2018-05-11 NOTE — Telephone Encounter (Signed)
Copied from [place] [phone]. Topic: Quick Communication - See Telephone Encounter >> May 11, 2018  3:30 PM Valla Leaver wrote: CRM for notification. See Telephone encounter for: 05/11/18. Patient would like to switch back to hydrochlorothiazide (HYDRODIURIL) 25 MG tablet . Please contact when switched.

## 2018-05-12 MED ORDER — HYDROCHLOROTHIAZIDE 25 MG PO TABS: ORAL_TABLET | ORAL | 3 refills | Status: DC

## 2018-05-12 NOTE — Telephone Encounter (Signed)
[facility] 25 [facility] [place].  Mrs. Hickmon notified by telephone.  Medication list updated.

## 2018-05-12 NOTE — Telephone Encounter (Signed)
Okay to switch back. D/C spironolactone and restart HCTZ at previous dose. Please refill x 1 year.

## 2018-05-12 NOTE — Addendum Note (Signed)
Addended by: Carter Kitten on: 05/12/2018 03:27 PM   Modules accepted: Orders

## 2018-05-14 ENCOUNTER — Other Ambulatory Visit: Payer: Self-pay | Admitting: Family Medicine

## 2018-05-16 MED ORDER — CHOLECALCIFEROL 1.25 MG (50000 UT) PO TABS: 1.0000 | ORAL_TABLET | ORAL | 0 refills | Status: DC

## 2018-05-16 NOTE — Telephone Encounter (Signed)
Michelle Gill notified as instructed by telephone.  She would like to do another 12 weeks of the high dose Vit D.  She is scheduled to come back in for labs in October and would like to have her Vit D level checked again.  [facility] [place]

## 2018-05-16 NOTE — Telephone Encounter (Signed)
Last office visit 05/03/2018 for CPE.  Last refilled 03/12/2018 for #9 with no refills.   Last Vit D level normal at 77.32 ng/ml 05/02/2018.  Refill?

## 2018-05-16 NOTE — Telephone Encounter (Signed)
Let pt know that given her vit D has been in normal range for last 3 measurements.. She can return to daily OTC vit D 400 IU 2 times daily. Refused Rx, but if she feels strongly about doing weekly vit D instead for longer given her osteoporosis.Faythe Ghee to refill for 3 more months #12 0RF

## 2018-05-16 NOTE — Addendum Note (Signed)
Addended by: Carter Kitten on: 05/16/2018 12:39 PM   Modules accepted: Orders

## 2018-05-25 DIAGNOSIS — M79644 Pain in right finger(s): Secondary | ICD-10-CM | POA: Diagnosis not present

## 2018-06-02 ENCOUNTER — Ambulatory Visit (INDEPENDENT_AMBULATORY_CARE_PROVIDER_SITE_OTHER): Payer: BLUE CROSS/BLUE SHIELD

## 2018-06-02 DIAGNOSIS — Z23 Encounter for immunization: Secondary | ICD-10-CM | POA: Diagnosis not present

## 2018-06-08 DIAGNOSIS — H2513 Age-related nuclear cataract, bilateral: Secondary | ICD-10-CM | POA: Diagnosis not present

## 2018-07-27 ENCOUNTER — Ambulatory Visit: Payer: BLUE CROSS/BLUE SHIELD | Admitting: Family Medicine

## 2018-07-27 ENCOUNTER — Encounter: Payer: Self-pay | Admitting: Family Medicine

## 2018-07-27 VITALS — BP 118/80 | HR 70 | Temp 98.4°F | Ht 63.5 in | Wt 135.1 lb

## 2018-07-27 DIAGNOSIS — M810 Age-related osteoporosis without current pathological fracture: Secondary | ICD-10-CM | POA: Diagnosis not present

## 2018-07-27 DIAGNOSIS — R232 Flushing: Secondary | ICD-10-CM | POA: Insufficient documentation

## 2018-07-27 NOTE — Assessment & Plan Note (Signed)
No red flags. healthy otherwise.. weight gain not loss.  Eval with labs.

## 2018-07-27 NOTE — Addendum Note (Signed)
Addended by: Lendon Collar on: 07/27/2018 04:57 PM   Modules accepted: Orders

## 2018-07-27 NOTE — Progress Notes (Signed)
Subjective:    Patient ID: Michelle Gill, female    DOB: [birth year], 67 y.o.   MRN: [id number]  HPI   67 year old female presents for hot flashes. Ongoing for years ( prior to menopause in 47s).. worse in last few months.  Also having night sweats.  She reports going through menopause in late 39s.  Nml thyroid in 03/2018  no other associated symtpoms.. Always feeling week and tired.  Osteoporosis:   DEXA   2013 on no med T-3.0 spine 2016 on Evista T -2.6  Femoral neck.... Had SE.  Started on fosamax.. SE gas , leg cramps, throat tightness  Changed actonel SE: throat tightening.  Will work on ca, vit D and weight bearing exercise, rechecked..  worsening osteoporosis.        2018 T-3.8 in spine.. Significant worsening from 2016   Had one injection of prolia.. Noted weight gain.. Did not want to continue. Now not sure what to do.   Blood pressure 118/80, pulse 70, temperature 98.4 F (36.9 C), temperature source Oral, height 5' 3.5" (1.613 m), weight 135 lb 1.9 oz (61.3 kg). Social History /Family History/Past Medical History reviewed in detail and updated in EMR if needed.  Review of Systems  Constitutional: Negative for fatigue and fever.  HENT: Negative for congestion.   Eyes: Negative for pain.  Respiratory: Negative for cough and shortness of breath.   Cardiovascular: Negative for chest pain, palpitations and leg swelling.  Gastrointestinal: Negative for abdominal pain.  Genitourinary: Negative for dysuria and vaginal bleeding.  Musculoskeletal: Negative for back pain.  Neurological: Negative for syncope, light-headedness and headaches.  Psychiatric/Behavioral: Negative for dysphoric mood.       Objective:   Physical Exam  Constitutional: Vital signs are normal. She appears well-developed and well-nourished. She is cooperative.  Non-toxic appearance. She does not appear ill. No distress.  HENT:  Head: Normocephalic.  Right Ear: Hearing, tympanic membrane,  external ear and ear canal normal. Tympanic membrane is not erythematous, not retracted and not bulging.  Left Ear: Hearing, tympanic membrane, external ear and ear canal normal. Tympanic membrane is not erythematous, not retracted and not bulging.  Nose: No mucosal edema or rhinorrhea. Right sinus exhibits no maxillary sinus tenderness and no frontal sinus tenderness. Left sinus exhibits no maxillary sinus tenderness and no frontal sinus tenderness.  Mouth/Throat: Uvula is midline, oropharynx is clear and moist and mucous membranes are normal.  Eyes: Pupils are equal, round, and reactive to light. Conjunctivae, EOM and lids are normal. Lids are everted and swept, no foreign bodies found.  Neck: Trachea normal and normal range of motion. Neck supple. Carotid bruit is not present. No thyroid mass and no thyromegaly present.  Cardiovascular: Normal rate, regular rhythm, S1 normal, S2 normal, normal heart sounds, intact distal pulses and normal pulses. Exam reveals no gallop and no friction rub.  No murmur heard. Pulmonary/Chest: Effort normal and breath sounds normal. No tachypnea. No respiratory distress. She has no decreased breath sounds. She has no wheezes. She has no rhonchi. She has no rales.  Abdominal: Soft. Normal appearance and bowel sounds are normal. There is no tenderness.  Neurological: She is alert.  Skin: Skin is warm, dry and intact. No rash noted.  Psychiatric: Her speech is normal and behavior is normal. Judgment and thought content normal. Her mood appears not anxious. Cognition and memory are normal. She does not exhibit a depressed mood.          Assessment & Plan:

## 2018-07-27 NOTE — Addendum Note (Signed)
Addended by: Lendon Collar on: 07/27/2018 05:06 PM   Modules accepted: Orders

## 2018-07-27 NOTE — Assessment & Plan Note (Signed)
Refer to ENDO for consideration of next step givne intolerant of many types of past meds including bisphosphonates, evista, prolia.

## 2018-07-27 NOTE — Patient Instructions (Addendum)
Please stop at the front desk to set up referral. Please stop at the lab to have labs drawn.

## 2018-07-28 LAB — IBC PANEL
IRON: 74 ug/dL (ref 42–145)
SATURATION RATIOS: 25.5 % (ref 20.0–50.0)
Transferrin: 207 mg/dL — ABNORMAL LOW (ref 212.0–360.0)

## 2018-07-28 LAB — LUTEINIZING HORMONE: LH: 14.03 m[IU]/mL

## 2018-07-28 LAB — FERRITIN: FERRITIN: 115.5 ng/mL (ref 10.0–291.0)

## 2018-07-28 LAB — TSH: TSH: 1.18 u[IU]/mL (ref 0.35–4.50)

## 2018-07-28 LAB — FOLLICLE STIMULATING HORMONE: FSH: 72.5 m[IU]/mL

## 2018-07-28 LAB — T4, FREE: Free T4: 0.8 ng/dL (ref 0.60–1.60)

## 2018-07-28 LAB — T3, FREE: T3, Free: 3 pg/mL (ref 2.3–4.2)

## 2018-07-29 ENCOUNTER — Other Ambulatory Visit (INDEPENDENT_AMBULATORY_CARE_PROVIDER_SITE_OTHER): Payer: BLUE CROSS/BLUE SHIELD

## 2018-07-29 DIAGNOSIS — M810 Age-related osteoporosis without current pathological fracture: Secondary | ICD-10-CM

## 2018-07-29 DIAGNOSIS — R232 Flushing: Secondary | ICD-10-CM

## 2018-07-29 LAB — CBC WITH DIFFERENTIAL/PLATELET
BASOS PCT: 1.2 % (ref 0.0–3.0)
Basophils Absolute: 0.1 10*3/uL (ref 0.0–0.1)
EOS PCT: 3.4 % (ref 0.0–5.0)
Eosinophils Absolute: 0.2 10*3/uL (ref 0.0–0.7)
HEMATOCRIT: 39.5 % (ref 36.0–46.0)
HEMOGLOBIN: 12.6 g/dL (ref 12.0–15.0)
LYMPHS PCT: 29 % (ref 12.0–46.0)
Lymphs Abs: 1.3 10*3/uL (ref 0.7–4.0)
MCHC: 32 g/dL (ref 30.0–36.0)
MCV: 73.5 fl — ABNORMAL LOW (ref 78.0–100.0)
MONOS PCT: 8.5 % (ref 3.0–12.0)
Monocytes Absolute: 0.4 10*3/uL (ref 0.1–1.0)
NEUTROS ABS: 2.6 10*3/uL (ref 1.4–7.7)
Neutrophils Relative %: 57.9 % (ref 43.0–77.0)
Platelets: 179 10*3/uL (ref 150.0–400.0)
RBC: 5.36 Mil/uL — ABNORMAL HIGH (ref 3.87–5.11)
RDW: 13.7 % (ref 11.5–15.5)
WBC: 4.5 10*3/uL (ref 4.0–10.5)

## 2018-07-30 LAB — ESTRADIOL: Estradiol: 16 pg/mL

## 2018-07-30 LAB — PROLACTIN: Prolactin: 3.9 ng/mL

## 2018-08-02 ENCOUNTER — Ambulatory Visit: Payer: BLUE CROSS/BLUE SHIELD | Admitting: Internal Medicine

## 2018-08-02 ENCOUNTER — Telehealth: Payer: Self-pay

## 2018-08-02 NOTE — Telephone Encounter (Signed)
Pt saw my chart lab results; pt was seen 07/27/18 and since hormone levels were normal pt wants to know what could be causing the hot flashes and night sweats. Pt request cb. [facility].

## 2018-08-02 NOTE — Telephone Encounter (Signed)
Call   Not clear what is causing the night sweats other than menopausal symtpoms (estrogen level suggested menopause/post menopausal state) Can discuss with ENDO at upcoming [facility].  Still no fever? How severe?  There was not sign of infection or lymphoma... Any exposure to HIV or tuberculosis? Tick bites over the summer?  Any palpitations, tremor, flushing, diarrhea, wheezing, palpitations, or headache associated?  No clear medications side effect.. Verify she is not taking additional side effects.

## 2018-08-03 NOTE — Telephone Encounter (Signed)
Given she is doing well and there is no clear sign/red flags for infection, neuroendocrine issues, pheochromocytoma, cancer etc.. I hesitate to start an large work up. Also doubt these are ongoing given she reported at the appointment that these symptoms have been going on for  many years, just worse lately.  Have her start by talking with ENDO at upcoming [place].  If they have no suggestions and given symptoms are severe and very bothersome..We can try treatments for menopausal symptoms if she would like.

## 2018-08-03 NOTE — Telephone Encounter (Signed)
Mrs. Clowdus notified as instructed by telephone.  She denies any fever.  She states her hands are hot all the time.  She is constantly sweating to the point she is having to wash her hair every other day due to the smell.  She denies any exposure with HIV or TB. She denies any palpitations, tremor, diarrhea.  She states she does have headaches and wheezing but that is all sinus related.  She is not taking any new medications.  She does take Zyrtec D every once in a while but has been doing that for year.  She states she did have a partial hysterectomy in 1987 and still has her ovaries so she is wondering if that could be part of the cause.  She feels like at age 67 she should be over postmenopausal symptoms.

## 2018-08-03 NOTE — Telephone Encounter (Signed)
Michelle Gill notified as instructed by telephone.  

## 2018-08-08 ENCOUNTER — Telehealth: Payer: Self-pay | Admitting: Family Medicine

## 2018-08-08 NOTE — Telephone Encounter (Signed)
Spoke with Mrs. Rayford Halsted.  She states she was reviewing her lab results and noticed that her [place] level does not show she is post menopausal.  She is wondering if this is what is causing her hot flashes.  She states when she looked it up that it can cause hot flashes, decrease libido, fatigue and osteoporosis, which she has.  She is scheduled to see Endo tomorrow.  I advised to make sure they review her lab results as well but that I would send Dr. Diona Browner a message for her input about her LH level.

## 2018-08-08 NOTE — Telephone Encounter (Signed)
Pt stated her last test result showed she was out of balance with her luteinizing hormone test and she need to speak to nurse concerning this.

## 2018-08-09 ENCOUNTER — Encounter: Payer: Self-pay | Admitting: Internal Medicine

## 2018-08-09 ENCOUNTER — Ambulatory Visit: Payer: BLUE CROSS/BLUE SHIELD | Admitting: Internal Medicine

## 2018-08-09 VITALS — BP 132/72 | HR 87 | Ht 63.0 in | Wt 136.4 lb

## 2018-08-09 DIAGNOSIS — M810 Age-related osteoporosis without current pathological fracture: Secondary | ICD-10-CM

## 2018-08-09 NOTE — Progress Notes (Signed)
Name: Michelle Gill  MRN/ DOB: [id number], Jan 31, 1951    Age/ Sex: 67 y.o., female    PCP: Jinny Sanders, MD   Reason for Endocrinology Evaluation: Osteoporosis      Date of Initial Endocrinology Evaluation: 08/09/2018     HPI: Michelle Gill is a 67 y.o. female with a past medical history of HTN, OA and Osteoporosis. The patient presented for initial endocrinology clinic visit on 08/09/2018 for consultative assistance with her Osteoporosis .   She was diagnosed with osteoporosis in 2013 with a lumbar T-Score of -3.0. In 2016 she was started on Evista with a T-score of -2.6 at the femoral neck but developed intolerance. She has tried bisphosphonate's in the past such as Fosamax and Actonel but developed throat tightening  With both with additional GI SE with fosamax. She had one dose of Prolia but due to weight gain, she did not want to proceed.    Mother with osteoporosis and hx of vertebral fractures.  She stopped Calcium ~ 1 yr ago because she is the impression her calcium is high. She is on Vitamin 2000 unit daily   She had a fall last November, 2018 due to a mechanical fall down the stairs with no fracture.     HISTORY:  Past Medical History:  Past Medical History:  Diagnosis Date  . Anal fissure   . Anemia   . Anxiety   . Asthma    exercise induced  . Hypertension   . Pneumonia 2006   Past Surgical History:  Past Surgical History:  Procedure Laterality Date  . 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 SINUS IRRIGATION    . VAGINAL HYSTERECTOMY  1986   partial      Social History:  reports that she has never smoked. She has never used smokeless tobacco. She reports that she does not drink alcohol or use drugs.  Family History: family history includes Alcohol abuse in her brother and father; Alzheimer's disease in her mother; Arthritis in her brother, mother, and sister; Cancer (age of onset: 20) in her father; Congestive Heart Failure in her mother;  Diabetes in her brother and sister; Gout in her brother, mother, and sister; Hyperlipidemia in her mother; Hypertension in her mother; Kidney disease in her mother.   HOME MEDICATIONS: Current Outpatient Medications on File Prior to Visit  Medication Sig Dispense Refill  . albuterol (VENTOLIN HFA) 108 (90 Base) MCG/ACT inhaler Inhale 2 puffs into the lungs every 4 (four) hours as needed. 18 g 2  . esomeprazole (NEXIUM) 40 MG capsule Take 40 mg by mouth as needed. For acid reflux    . ferrous sulfate 324 (65 FE) MG TBEC Take 1 tablet by mouth daily.    . hydrochlorothiazide (HYDRODIURIL) 25 MG tablet TAKE 1 TAB DAILY WITH ADDITIONAL 1/2 TAB DAILY AS NEEDED FOR SWELLING. 135 tablet 3  . omega-3 acid ethyl esters (LOVAZA) 1 g capsule Take by mouth daily.    Marland Kitchen triamcinolone (NASACORT) 55 MCG/ACT AERO nasal inhaler PLACE 2 SPRAYS IN EACH NOSTRIL ONCE A DAY FOR ALLERGIES (Patient taking differently: PLACE 2 SPRAYS IN EACH NOSTRIL ONCE A DAY FOR ALLERGIES as needed) 16.9 mL 5   No current facility-administered medications on file prior to visit.       REVIEW OF SYSTEMS: A comprehensive ROS was conducted with the patient and is negative except as per HPI and below:  Review of Systems  Constitutional: Negative for fever  and weight loss.  HENT: Positive for congestion. Negative for sore throat.   Eyes: Negative for blurred vision and pain.  Respiratory: Negative for cough and shortness of breath.   Cardiovascular: Negative for chest pain and palpitations.  Gastrointestinal: Positive for constipation. Negative for nausea.  Genitourinary: Positive for frequency.  Neurological: Negative for tingling and tremors.  Endo/Heme/Allergies: Negative for polydipsia.  Psychiatric/Behavioral: Positive for depression. The patient is not nervous/anxious.        OBJECTIVE:  VS: BP 132/72 (BP Location: Left Arm, Patient Position: Sitting, Cuff Size: Normal)   Pulse 87   Ht 5\' 3"  (1.6 m)   Wt 136 lb 6.4 oz  (61.9 kg)   SpO2 99%   BMI 24.16 kg/m    Wt Readings from Last 3 Encounters:  08/09/18 136 lb 6.4 oz (61.9 kg)  07/27/18 135 lb 1.9 oz (61.3 kg)  05/03/18 136 lb 12 oz (62 kg)     EXAM: General: Pt appears well and is in NAD  Hydration: Well-hydrated with moist mucous membranes and good skin turgor  Eyes: External eye exam normal without stare, lid lag or exophthalmos.  EOM intact.  PERRL.  Ears, Nose, Throat: Hearing: Grossly intact bilaterally Dental: Good dentition  Throat: Clear without mass, erythema or exudate  Neck: General: Supple without adenopathy. Thyroid: Thyroid size normal.  No goiter or nodules appreciated. No thyroid bruit.  Lungs: Clear with good BS bilat with no rales, rhonchi, or wheezes  Heart: Auscultation: RRR.  Abdomen: Normoactive bowel sounds, soft, nontender, without masses or organomegaly palpable  Extremities: BL LE: No pretibial edema normal ROM and strength.  Skin: Hair: Texture and amount normal with gender appropriate distribution Skin Inspection: No rashes, acanthosis nigricans/skin tags.  Skin Palpation: Skin temperature, texture, and thickness normal to palpation  Neuro: Cranial nerves: II - XII grossly intact  Motor: Normal strength throughout DTRs: 2+ and symmetric in UE without delay in relaxation phase  Mental Status: Judgment, insight: Intact Orientation: Oriented to time, place, and person Mood and affect: No depression, anxiety, or agitation     DATA REVIEWED:  DXA (03/23/2018) Right femur neck T-score -2.4  Left femur neck     T-score -2.0 Right total femur -2.0 Left total femur -2.10 AP total spine -3.30   CMP Latest Ref Rng & Units 04/29/2018 03/16/2018 04/05/2017  Glucose 70 - 99 mg/dL 94 111(H) -  BUN 6 - 23 mg/dL 10 11 -  Creatinine 0.40 - 1.20 mg/dL 0.73 0.70 -  Sodium 135 - 145 mEq/L 137 139 -  Potassium 3.5 - 5.1 mEq/L 4.3 3.7 -  Chloride 96 - 112 mEq/L 102 109 -  CO2 19 - 32 mEq/L 28 24 -  Calcium 8.4 - 10.5 mg/dL  10.1 9.2 10.0  Total Protein 6.0 - 8.3 g/dL 6.9 - -  Total Bilirubin 0.2 - 1.2 mg/dL 0.9 - -  Alkaline Phos 39 - 117 U/L 113 - -  AST 0 - 37 U/L 15 - -  ALT 0 - 35 U/L 12 - -      ASSESSMENT/PLAN/RECOMMENDATIONS:   1. Osteoporosis :    - We discussed her high risk for fractures especially at the spine.  - I have encouraged her to start calcium supplements but she doesn't want to take them due to constipation side effects, she was advised to consume in her diet ~ 1200 mg of calcium a day, a list of food with high calcium was printed for her today.  - Continue Vitamin  D 2000 mg daily - We have discussed bisphosphonates as the first line of therapy as long as her GFR is > 35.  - She is intolerant to prolia or oral bisphosphonates, she is not interested in forteo - I strongly recommend starting Reclast on her, we discussed local side effects since its an infusion such as allergic reactions, rash , we also discussed risk of hypocalcemia with no proper calcium or vitamin d intake. We also discussed the rare side effects of osteonecrosis and atypical fractures especially with long term use.  - We discussed after 3 yrs of using reclast, we may consider a drug holiday. - Pt would like to think about this and contact us when she is ready to start this.    F/u PRN   Signed electronically by: Mack Guise, MD  [facility]  [facility] [address] [place], [place] [address] Phone: [phone] FAX: [phone]   CC: Jinny Sanders, MD [place] [address] Phone: [phone] Fax: [phone]   Return to Endocrinology clinic as below: No future appointments.

## 2018-08-09 NOTE — Telephone Encounter (Signed)
I think this isolated slighlyt lower LH is not meaningful on its own. I would recommend awaiting eval with  ENDO.

## 2018-08-09 NOTE — Patient Instructions (Addendum)
-   Continue Vitamin D 2000 units daily - Try and consume ~ 1200 mg of calcium a day   FOODS THAT CONTAIN CALCIUM  Calcium can be found in many foods, not only in dairy products.  Dairy Foods  Yogurt (1 cup) 350 mg  Milk (1 cup) 300 mg  Cheddar cheese (1 oz.) 204 mg  Ricotta cheese, part skim (1/4 cup) 169 mg  Cottage cheese (1 cup) 150 mg  Nondairy Foods  Whole Grain Total cereal (3/4 cup) 1000 mg  Pink salmon with bones, sardines (3 oz., cooked) 181 mg  Black beans (1 cup) 103 mg  Broccoli (1 cup, cooked) 150 mg  Almonds (1 tbsp.) 50 mg  Soy Products  Soy yogurt with calcium (3/4 cup) 300 mg  Soy milk enriched with calcium (1 cup) 300 mg  Tofu, firm or extra firm (1/4 cup) 250 mg  Soy nuts, roasted/salted (1/2 cup) 103 mg    - Today we discussed Reclast as an option for your osteoporosis

## 2018-08-10 NOTE — Telephone Encounter (Signed)
Left detailed message for [name] with message below from Dr. Diona Browner.  She was seen be ENDO yesterday.  I ask that if there were no answers given at her ENDO appointment, to call me back for further direction.

## 2018-09-03 ENCOUNTER — Other Ambulatory Visit: Payer: Self-pay | Admitting: Family Medicine

## 2018-09-05 NOTE — Telephone Encounter (Signed)
Last office visit 07/27/2018 for osteoporosis.  Last Vit D Level was normal on 05/02/2018 at 77.32 ng/ml.  Not on current mediation list.  Refill?

## 2018-10-04 ENCOUNTER — Telehealth: Payer: Self-pay | Admitting: Internal Medicine

## 2018-10-04 NOTE — Telephone Encounter (Signed)
Patient would like a call back from the Doctor when she returns to discuss about different osteoporosis medications she could try instead of the direction Dr Kelton Pillar had advised at her last visit

## 2018-10-05 NOTE — Telephone Encounter (Signed)
Called pt and she stated that after some research, she has concluded that she can take smaller doses of Actonel multiple times per month, rather than one large dose once monthly, and she would prefer to do this rather than do the yearly infusion of Reclast. When asked about reservations concerning Reclast, the patient verbalized her concern that once the infusion is completed, she cannot undo it, and she is concerned about long lasting side effects because this medication lasts longer. She stated with low doses of Actonel, the side effects should not last as long and she can stop taking it if they are too severe, and the medication will be out of her system quicker than the longer lasting IV infusion.  Pt was advised that her concerns would be sent to the doctor, however, she is currently out of the office, and to expect a call Monday or some times next week with the doctors response. Pt verbalized understanding of this and stated that this is perfectly fine with her.

## 2018-10-06 DIAGNOSIS — M25561 Pain in right knee: Secondary | ICD-10-CM | POA: Diagnosis not present

## 2018-10-06 DIAGNOSIS — M79672 Pain in left foot: Secondary | ICD-10-CM | POA: Diagnosis not present

## 2018-10-06 DIAGNOSIS — M25562 Pain in left knee: Secondary | ICD-10-CM | POA: Diagnosis not present

## 2018-10-07 MED ORDER — RISEDRONATE SODIUM 35 MG PO TABS: 35.0000 mg | ORAL_TABLET | ORAL | 3 refills | Status: DC

## 2018-10-07 NOTE — Telephone Encounter (Signed)
Spoke to Ms. Michelle Gill on 10/07/18 @ 10:50 AM.    I have discussed with her my concerns about prior use of fosamax and per her last visit , Actonel and developing "throat tightness" Pt assured me today, that even though she had a prescription for Actonel, she never started it. She would like to give actonel a try prior yo venturing on to Reclast.   We discussed GI side effects and the importance of taking it with 8 oz of water, we also discussed the importance of staying in the upright position, 30 minutes after intake.  Discussed rare side effects of osteonecrosis and atypical fracture. We discussed the importance of assessing for a drug holiday after 5 yrs of intake.  She has a questionable tooth issue, I have advised her to contact her dentist prior to starting Actonel and letting them know the plan, in case she needs any dental work up prior to proceeding with actonel intake.   Pt also advised the needs for a f/u in 2 yrs.   Pt expressed understanding.     Michelle Nena Jordan, MD  [facility]  [facility] [address] [place], [place] [address] Phone: [phone] FAX: [phone]

## 2018-10-07 NOTE — Addendum Note (Signed)
Addended by: Dorita Sciara on: 10/07/2018 10:57 AM   Modules accepted: Orders

## 2018-11-05 ENCOUNTER — Encounter: Payer: Self-pay | Admitting: Gastroenterology

## 2018-11-08 DIAGNOSIS — J329 Chronic sinusitis, unspecified: Secondary | ICD-10-CM | POA: Diagnosis not present

## 2018-11-08 DIAGNOSIS — J0140 Acute pansinusitis, unspecified: Secondary | ICD-10-CM | POA: Diagnosis not present

## 2018-11-16 ENCOUNTER — Other Ambulatory Visit: Payer: Self-pay | Admitting: Family Medicine

## 2018-11-16 NOTE — Telephone Encounter (Signed)
Last office visit 07/27/2018 for osteoporosis.  Last Vit D Level 05/02/2018 normal at 77.32 ng/ml.  Last refilled 09/06/2018 for #12 with no refills.  No future visits with PCP.

## 2019-03-10 ENCOUNTER — Ambulatory Visit: Payer: Self-pay | Admitting: *Deleted

## 2019-03-10 NOTE — Telephone Encounter (Signed)
Summary: ? spider bite in/on right ear   Patient calling and states that she believes that she was bitten by a spider in her right ear. States that it is swollen and feverish. Would like to know what she should do. Please advise.       Pt stated that on Wednesday, when she was outside doing yard work. She felt something on her right ear lobe and swiung at her ear. She never heard buzzing or saw anything. So she assuming it could have been a spider. Her ear was pink and she found something  that looked like a stinger. Which she pulled out. Last night her ear was throbbing and red.  She was advised by her pharmacist to take a benadryl and ibuprofen.  Which she did. Her ear still hurts. Advised to go to an Urgent Care for evaulation. [facility] to put on it and have the nurse there to look at it. Routing encounter to the office of [facility] at [facility].  Reason for Disposition . [1] Red or very tender (to touch) area AND [2] started over 24 hours after the bite  Answer Assessment - Initial Assessment Questions 1. TYPE of SPIDER: "What type of spider was it?"  (e.g., name, unknown, or brief description)     Not sure 2. LOCATION: "Where is the bite located?"      Right ear 3. PAIN: "Is there any pain?" If so, ask: "How bad is it?"  (Scale 1-10; or mild, moderate, severe)     Hurting now and feels strange 4. SWELLING: "How big is the swelling?" (Inches, cm or compare to coins)      On the lobe 5. ONSET: "When did the bite occur?" (Minutes or hours ago)      wednesday 6. TETANUS: "When was the last tetanus booster?"      Within the time frame 7. OTHER SYMPTOMS: "Do you have any other symptoms?"  (e.g., muscle cramps, abdominal pain, change in urine color)     Side was hurting yesterday  Protocols used: [place]

## 2019-03-13 NOTE — Telephone Encounter (Signed)
I spoke with pt and pt did not go to UC; pt was afraid to go to UC due to fear of covid. Pt using abx cream and took Benadryl and ibuprofen. Pt does not have pain in ear now and does not feel like it is warm any longer but has like a knot(size of green pea) on ear where was bitten; still feels tender to touch on. Today the knot on ear is a lot less swollen than 03/10/19. Pt feels a lot better today; is 95% better. Pt will continue what she is doing and if needed will cb for appt unless Dr Diona Browner has other advice or suggestions. No H/A,dizziness, SOB,swelling in throat,mouth or tongue. FYI to Dr Diona Browner.

## 2019-04-28 ENCOUNTER — Other Ambulatory Visit: Payer: Self-pay | Admitting: Family Medicine

## 2019-05-09 ENCOUNTER — Telehealth: Payer: Self-pay | Admitting: Radiology

## 2019-05-09 ENCOUNTER — Telehealth: Payer: Self-pay | Admitting: Family Medicine

## 2019-05-09 ENCOUNTER — Other Ambulatory Visit (INDEPENDENT_AMBULATORY_CARE_PROVIDER_SITE_OTHER): Payer: BC Managed Care – PPO

## 2019-05-09 DIAGNOSIS — I10 Essential (primary) hypertension: Secondary | ICD-10-CM

## 2019-05-09 DIAGNOSIS — E559 Vitamin D deficiency, unspecified: Secondary | ICD-10-CM

## 2019-05-09 LAB — LIPID PANEL
Cholesterol: 213 mg/dL — ABNORMAL HIGH (ref 0–200)
HDL: 81.3 mg/dL (ref >39.00)
LDL Cholesterol: 119 mg/dL — ABNORMAL HIGH (ref 0–99)
NonHDL: 131.77
Total CHOL/HDL Ratio: 3
Triglycerides: 64 mg/dL (ref 0.0–149.0)
VLDL: 12.8 mg/dL (ref 0.0–40.0)

## 2019-05-09 LAB — COMPREHENSIVE METABOLIC PANEL
ALT: 12 U/L (ref 0–35)
AST: 16 U/L (ref 0–37)
Albumin: 4.1 g/dL (ref 3.5–5.2)
Alkaline Phosphatase: 104 U/L (ref 39–117)
BUN: 9 mg/dL (ref 6–23)
CO2: 31 mEq/L (ref 19–32)
Calcium: 9.9 mg/dL (ref 8.4–10.5)
Chloride: 102 mEq/L (ref 96–112)
Creatinine, Ser: 0.63 mg/dL (ref 0.40–1.20)
GFR: 113.02 mL/min (ref >60.00)
Glucose, Bld: 93 mg/dL (ref 70–99)
Potassium: 3.9 mEq/L (ref 3.5–5.1)
Sodium: 140 mEq/L (ref 135–145)
Total Bilirubin: 0.8 mg/dL (ref 0.2–1.2)
Total Protein: 6.3 g/dL (ref 6.0–8.3)

## 2019-05-09 LAB — VITAMIN D 25 HYDROXY (VIT D DEFICIENCY, FRACTURES): VITD: 53.22 ng/mL (ref 30.00–100.00)

## 2019-05-09 NOTE — Progress Notes (Signed)
No critical labs need to be addressed urgently. We will discuss labs in detail at upcoming office visit.   

## 2019-05-09 NOTE — Telephone Encounter (Signed)
Opened in error

## 2019-05-09 NOTE — Telephone Encounter (Signed)
-----   Message from Ellamae Sia sent at 05/09/2019  8:57 AM EDT ----- Regarding: lab orders for now Patient is scheduled for CPX labs, please order future labs, Thanks , Terri  Vit d requested by pt

## 2019-05-16 ENCOUNTER — Ambulatory Visit (INDEPENDENT_AMBULATORY_CARE_PROVIDER_SITE_OTHER): Payer: BC Managed Care – PPO | Admitting: Family Medicine

## 2019-05-16 ENCOUNTER — Encounter: Payer: Self-pay | Admitting: Family Medicine

## 2019-05-16 ENCOUNTER — Other Ambulatory Visit: Payer: Self-pay

## 2019-05-16 ENCOUNTER — Telehealth: Payer: Self-pay | Admitting: Family Medicine

## 2019-05-16 VITALS — BP 120/74 | HR 87 | Temp 97.6°F | Ht 63.5 in | Wt 138.2 lb

## 2019-05-16 DIAGNOSIS — E7800 Pure hypercholesterolemia, unspecified: Secondary | ICD-10-CM | POA: Diagnosis not present

## 2019-05-16 DIAGNOSIS — Z0000 Encounter for general adult medical examination without abnormal findings: Secondary | ICD-10-CM | POA: Diagnosis not present

## 2019-05-16 DIAGNOSIS — Z23 Encounter for immunization: Secondary | ICD-10-CM | POA: Diagnosis not present

## 2019-05-16 DIAGNOSIS — I10 Essential (primary) hypertension: Secondary | ICD-10-CM | POA: Diagnosis not present

## 2019-05-16 DIAGNOSIS — M810 Age-related osteoporosis without current pathological fracture: Secondary | ICD-10-CM

## 2019-05-16 DIAGNOSIS — E559 Vitamin D deficiency, unspecified: Secondary | ICD-10-CM

## 2019-05-16 NOTE — Telephone Encounter (Signed)
Michelle Gill is handling the prolia.  I will forward this to her.   Thanks.

## 2019-05-16 NOTE — Progress Notes (Signed)
Chief Complaint  Patient presents with  . Annual Exam    History of Present Illness: HPI  The patient is here for annual wellness exam and preventative care.    Hypertension:    Good control on HCTZ BP Readings from Last 3 Encounters:  05/16/19 120/74  08/09/18 132/72  07/27/18 118/80  Using medication without problems or lightheadedness:  none Chest pain with exertion:none Edema:none Short of breath:none Average home BPs: not checking. Other issues:  Reviewed labs in detail.  Diet: moderate Exercise: daily activity, 10000 steps per day.  Elevated Cholesterol: 11.9%  10 year risk of  CVD, CAD.  She refuses statin at this time.    Osteoporosis: on  Actonel... similar SE to fosamax.  Makes her feel achy all over.  She would like to use prolia again... she had stopped this given possible weight gain.     Office Visit from 05/16/2019 in [place] at [facility] Total Score  0       COVID 19 screen No recent travel or known exposure to COVID19 The patient denies respiratory symptoms of COVID 19 at this time.  The importance of social distancing was discussed today.   Review of Systems  Constitutional: Negative for chills and fever.  HENT: Negative for congestion and ear pain.   Eyes: Negative for pain and redness.  Respiratory: Negative for cough and shortness of breath.   Cardiovascular: Negative for chest pain, palpitations and leg swelling.  Gastrointestinal: Negative for abdominal pain, blood in stool, constipation, diarrhea, nausea and vomiting.  Genitourinary: Negative for dysuria.  Musculoskeletal: Negative for falls and myalgias.  Skin: Negative for rash.  Neurological: Negative for dizziness.  Psychiatric/Behavioral: Negative for depression. The patient is not nervous/anxious.       Past Medical History:  Diagnosis Date  . Anal fissure   . Anemia   . Anxiety   . Asthma    exercise induced  . Hypertension   . Pneumonia 2006    reports that she has never smoked. She has never used smokeless tobacco. She reports that she does not drink alcohol or use drugs.   Current Outpatient Medications:  .  albuterol (VENTOLIN HFA) 108 (90 Base) MCG/ACT inhaler, TAKE 2 PUFFS INTO THE LUNGS EVERY 4 HOURS AS NEEDED, Disp: 18 g, Rfl: 5 .  cetirizine-pseudoephedrine (ZYRTEC-D) 5-120 MG tablet, Take 1 tablet by mouth 2 (two) times daily as needed for allergies., Disp: , Rfl:  .  D3-50 1.25 MG (50000 UT) capsule, TAKE 1 CAPSULE BY MOUTH ONE TIME PER WEEK, Disp: 12 capsule, Rfl: 0 .  esomeprazole (NEXIUM) 40 MG capsule, Take 40 mg by mouth as needed. For acid reflux, Disp: , Rfl:  .  ferrous sulfate 324 (65 FE) MG TBEC, Take 1 tablet by mouth daily., Disp: , Rfl:  .  hydrochlorothiazide (HYDRODIURIL) 25 MG tablet, TAKE 1 TAB DAILY WITH ADDITIONAL 1/2 TAB DAILY AS NEEDED FOR SWELLING., Disp: 135 tablet, Rfl: 3 .  omega-3 acid ethyl esters (LOVAZA) 1 g capsule, Take by mouth daily., Disp: , Rfl:  .  risedronate (ACTONEL) 35 MG tablet, Take 1 tablet (35 mg total) by mouth every 7 (seven) days. with water on empty stomach, nothing by mouth or lie down for next 30 minutes., Disp: 13 tablet, Rfl: 3 .  triamcinolone (NASACORT) 55 MCG/ACT AERO nasal inhaler, PLACE 2 SPRAYS IN EACH NOSTRIL ONCE A DAY FOR ALLERGIES (Patient taking differently: PLACE 2 SPRAYS IN EACH NOSTRIL ONCE A DAY FOR ALLERGIES  as needed), Disp: 16.9 mL, Rfl: 5   Observations/Objective: Blood pressure 120/74, pulse 87, temperature 97.6 F (36.4 C), temperature source Oral, height 5' 3.5" (1.613 m), weight 138 lb 4 oz (62.7 kg), SpO2 94 %.  Physical Exam Constitutional:      General: She is not in acute distress.    Appearance: Normal appearance. She is well-developed. She is not ill-appearing or toxic-appearing.  HENT:     Head: Normocephalic.     Right Ear: Hearing, tympanic membrane, ear canal and external ear normal. Tympanic membrane is not erythematous, retracted or  bulging.     Left Ear: Hearing, tympanic membrane, ear canal and external ear normal. Tympanic membrane is not erythematous, retracted or bulging.     Nose: No mucosal edema or rhinorrhea.     Right Sinus: No maxillary sinus tenderness or frontal sinus tenderness.     Left Sinus: No maxillary sinus tenderness or frontal sinus tenderness.     Mouth/Throat:     Pharynx: Uvula midline.  Eyes:     General: Lids are normal. Lids are everted, no foreign bodies appreciated.     Conjunctiva/sclera: Conjunctivae normal.     Pupils: Pupils are equal, round, and reactive to light.  Neck:     Musculoskeletal: Normal range of motion and neck supple.     Thyroid: No thyroid mass or thyromegaly.     Vascular: No carotid bruit.     Trachea: Trachea normal.  Cardiovascular:     Rate and Rhythm: Normal rate and regular rhythm.     Pulses: Normal pulses.     Heart sounds: Normal heart sounds, S1 normal and S2 normal. No murmur. No friction rub. No gallop.   Pulmonary:     Effort: Pulmonary effort is normal. No tachypnea or respiratory distress.     Breath sounds: Normal breath sounds. No decreased breath sounds, wheezing, rhonchi or rales.  Abdominal:     General: Bowel sounds are normal.     Palpations: Abdomen is soft.     Tenderness: There is no abdominal tenderness.  Skin:    General: Skin is warm and dry.     Findings: No rash.  Neurological:     Mental Status: She is alert.  Psychiatric:        Mood and Affect: Mood is not anxious or depressed.        Speech: Speech normal.        Behavior: Behavior normal. Behavior is cooperative.        Thought Content: Thought content normal.        Judgment: Judgment normal.      Assessment and Plan   The patient's preventative maintenance and recommended screening tests for an annual wellness exam were reviewed in full today. Brought up to date unless services declined.  Counselled on the importance of diet, exercise, and its role in overall  health and mortality. The patient's FH and SH was reviewed, including their home life, tobacco status, and drug and alcohol status.   Mammogram7/2019... plans to schedule Colonoscopy 2010, Dr. Deatra Ina, plans repeat in 2020. .. plans to schedule Last DEXA: 03/2018 improved and almost in osteopenia range SE to evista and SE fosamax. Nervous about forteo.  PAP/DVE: no pap indicated partial hystec, Vaccines:uptodate with PNA, Tdap, zoster,prevnar .. flu given today Nonsmoker Hep C:done  Benign essential HTN Well controlled. Continue current medication.  High cholesterol 11.9% risk of CVD.Marland Kitchen. statin indicated but pt refuses to start.Marland Kitchen worry about SE.  She  will insead work on low chol diet.Marland Kitchen stop baked goods , ice cream etc.  Vitamin D deficiency  Resolved on supplement.  Osteoporosis Wishes to restart prolia. Will start process.    Eliezer Lofts, MD

## 2019-05-16 NOTE — Assessment & Plan Note (Signed)
11.9% risk of CVD.Michelle Gill. statin indicated but pt refuses to start.Michelle Gill worry about SE.  She will insead work on [employer].Michelle Gill stop baked goods , ice cream etc.

## 2019-05-16 NOTE — Assessment & Plan Note (Signed)
Well controlled. Continue current medication.  

## 2019-05-16 NOTE — Assessment & Plan Note (Signed)
Resolved on supplement 

## 2019-05-16 NOTE — Assessment & Plan Note (Signed)
Wishes to restart prolia. Will start process.

## 2019-05-16 NOTE — Patient Instructions (Addendum)
Work on low cholesterol diet.  Return for cholesterol check in 3-6 months.  Call with your plan of colon cancer screening.  Set up mammogram on your own.

## 2019-05-16 NOTE — Telephone Encounter (Signed)
This patient wishes to restart prolia. Who is handling this now?   Labs done 9.1  Ca 9.9  Cr 0.63  GFR 113

## 2019-06-01 ENCOUNTER — Other Ambulatory Visit: Payer: Self-pay | Admitting: Family Medicine

## 2019-06-29 ENCOUNTER — Telehealth: Payer: Self-pay

## 2019-06-29 NOTE — Telephone Encounter (Signed)
Pt has not received [employer] card for Prolia yet.  Will check w/Prolia rep.  Once received, pt to go to her pharmacy and request Prolia to be ordered.  She will use Co Pay card to pick up Prolia and bring in for administration.

## 2019-07-07 MED ORDER — DENOSUMAB 60 MG/ML ~~LOC~~ SOSY: 60.0000 mg | PREFILLED_SYRINGE | SUBCUTANEOUS | 1 refills | Status: DC

## 2019-07-07 NOTE — Telephone Encounter (Signed)
[facility]  

## 2019-07-07 NOTE — Telephone Encounter (Signed)
[place], [facility] in [place], called and left a message on triage line stating patient came there for Prolia RX but they dod not dispense that it has to [facility] so we can send it there or if we have questions about this process Dollie can help. Charmaine does this just needs to go to Dr Diona Browner for review?

## 2019-07-07 NOTE — Telephone Encounter (Signed)
Please send Rx Prolia w/1 refill to [facility].  They will [facility] and have shipped back to them for pt to p/u.  Pt will be able to then use her Co Pay card to purchase.  Pt knows to keep refrigerated until her appt time.

## 2019-07-07 NOTE — Addendum Note (Signed)
Addended by: Eliezer Lofts E on: 07/07/2019 02:06 PM   Modules accepted: Orders

## 2019-07-10 ENCOUNTER — Telehealth: Payer: Self-pay | Admitting: *Deleted

## 2019-07-10 NOTE — Telephone Encounter (Addendum)
Received fax from [facility] requesting PA for Prolia.  PA completed on CoverMyMeds and approved.  Coverage Starts on: 07/10/2019 12:00:00 AM, Coverage Ends on: 07/09/2020 12:00:00 AM.  [facility] notified of [facility] via fax.

## 2019-07-28 ENCOUNTER — Telehealth: Payer: Self-pay

## 2019-07-28 NOTE — Telephone Encounter (Signed)
[place] Night - Client Nonclinical Telephone Record AccessNurse Client [place] Night - Client Client Site [place] Physician Eliezer Lofts - MD Contact Type Call Who Is Calling Patient / Member / Family / Caregiver Caller Name Michelle Gill Phone Number [phone] Patient Name Michelle Gill Patient DOB 02-03-1951 Call Type Message Only Information Provided Reason for Call Request for General Office Information Initial Comment Caller states he needs to schedule a delivery of medication. Additional Comment Disp. Time Disposition Final User 07/27/2019 6:37:31 PM General Information Provided Yes Byers, Stefanie Call Closed By: Corey Harold Transaction Date/Time: 07/27/2019 6:35:05 PM (ET)

## 2019-07-28 NOTE — Telephone Encounter (Signed)
Prolia to be shipped to office 08-01-2019.

## 2019-08-10 ENCOUNTER — Ambulatory Visit (INDEPENDENT_AMBULATORY_CARE_PROVIDER_SITE_OTHER): Payer: BC Managed Care – PPO

## 2019-08-10 ENCOUNTER — Other Ambulatory Visit: Payer: Self-pay

## 2019-08-10 DIAGNOSIS — M810 Age-related osteoporosis without current pathological fracture: Secondary | ICD-10-CM | POA: Diagnosis not present

## 2019-08-10 MED ORDER — DENOSUMAB 60 MG/ML ~~LOC~~ SOSY
60.0000 mg | PREFILLED_SYRINGE | Freq: Once | SUBCUTANEOUS | Status: AC
  Administered 2019-08-10: 60 mg via SUBCUTANEOUS

## 2019-08-10 NOTE — Progress Notes (Signed)
Per orders of Dr. Diona Browner, injection of Prolia given by Brenton Grills. Patient tolerated injection well.

## 2019-08-17 ENCOUNTER — Encounter: Payer: Self-pay | Admitting: Family Medicine

## 2019-08-17 ENCOUNTER — Other Ambulatory Visit: Payer: Self-pay

## 2019-08-17 ENCOUNTER — Ambulatory Visit (INDEPENDENT_AMBULATORY_CARE_PROVIDER_SITE_OTHER): Payer: BC Managed Care – PPO | Admitting: Family Medicine

## 2019-08-17 VITALS — Ht 63.5 in | Wt 133.0 lb

## 2019-08-17 DIAGNOSIS — J0100 Acute maxillary sinusitis, unspecified: Secondary | ICD-10-CM | POA: Diagnosis not present

## 2019-08-17 MED ORDER — AMOXICILLIN 500 MG PO CAPS: 1000.0000 mg | ORAL_CAPSULE | Freq: Two times a day (BID) | ORAL | 0 refills | Status: DC

## 2019-08-17 NOTE — Progress Notes (Signed)
VIRTUAL VISIT Due to national recommendations of social distancing due to [place] 19, a virtual visit is felt to be most appropriate for this patient at this time.   I connected with the patient on 08/17/19 at  4:00 PM EST by virtual telehealth platform and verified that I am speaking with the correct person using two identifiers.   I discussed the limitations, risks, security and privacy concerns of performing an evaluation and management service by  virtual telehealth platform and the availability of in person appointments. I also discussed with the patient that there may be a patient responsible charge related to this service. The patient expressed understanding and agreed to proceed.  Patient location: Home Provider Location: [facility] Participants: Eliezer Lofts and Bella Kennedy   Chief Complaint  Patient presents with  . Facial Pain  . Nasal Congestion  . Sinus Drainage    History of Present Illness:  68 year old female with history of exercise induced asthma, HTN presents with new onset  Facial pain and nasal congestion.  In last 2 weeks she has had excessive mucus production and facial tightness, occ sore throat and post nasal drip.  Mucus is now yellowish , green.  No fever. No ear pain .   She has been using tylenol, zyrtec D, nasacort daily  off and on... helped some.  She denies exposure to [place].  Very good about distancing.   COVID 19 screen No recent travel or known exposure to [place]  The importance of social distancing was discussed today.   Review of Systems  Constitutional: Negative for chills and fever.  HENT: Negative for congestion and ear pain.   Eyes: Negative for pain and redness.  Respiratory: Negative for cough and shortness of breath.   Cardiovascular: Negative for chest pain, palpitations and leg swelling.  Gastrointestinal: Negative for abdominal pain, blood in stool, constipation, diarrhea, nausea and vomiting.  Genitourinary:  Negative for dysuria.  Musculoskeletal: Negative for falls and myalgias.  Skin: Negative for rash.  Neurological: Negative for dizziness.  Psychiatric/Behavioral: Negative for depression. The patient is not nervous/anxious.       Past Medical History:  Diagnosis Date  . Anal fissure   . Anemia   . Anxiety   . Asthma    exercise induced  . Hypertension   . Pneumonia 2006    reports that she has never smoked. She has never used smokeless tobacco. She reports that she does not drink alcohol or use drugs.   Current Outpatient Medications:  .  albuterol (VENTOLIN HFA) 108 (90 Base) MCG/ACT inhaler, TAKE 2 PUFFS INTO THE LUNGS EVERY 4 HOURS AS NEEDED, Disp: 18 g, Rfl: 5 .  cetirizine-pseudoephedrine (ZYRTEC-D) 5-120 MG tablet, Take 1 tablet by mouth 2 (two) times daily as needed for allergies., Disp: , Rfl:  .  D3-50 1.25 MG (50000 UT) capsule, TAKE 1 CAPSULE BY MOUTH ONE TIME PER WEEK, Disp: 12 capsule, Rfl: 0 .  denosumab (PROLIA) 60 MG/ML SOSY injection, Inject 60 mg into the skin every 6 (six) months., Disp: 1 mL, Rfl: 1 .  esomeprazole (NEXIUM) 40 MG capsule, Take 40 mg by mouth as needed. For acid reflux, Disp: , Rfl:  .  ferrous sulfate 324 (65 FE) MG TBEC, Take 1 tablet by mouth daily., Disp: , Rfl:  .  hydrochlorothiazide (HYDRODIURIL) 25 MG tablet, TAKE 1 TABLET BY MOUTH DAILY WITH AN ADDITIONAL HALF TAB AS NEEDED FOR SWELLING, Disp: 135 tablet, Rfl: 3 .  omega-3 acid ethyl esters (  LOVAZA) 1 g capsule, Take by mouth daily., Disp: , Rfl:  .  triamcinolone (NASACORT) 55 MCG/ACT AERO nasal inhaler, PLACE 2 SPRAYS IN EACH NOSTRIL ONCE A DAY FOR ALLERGIES (Patient taking differently: PLACE 2 SPRAYS IN EACH NOSTRIL ONCE A DAY FOR ALLERGIES as needed), Disp: 16.9 mL, Rfl: 5   Observations/Objective: Height 5' 3.5" (1.613 m), weight 133 lb (60.3 kg).  Physical Exam  Physical Exam Constitutional:      General: The patient is not in acute distress. Pulmonary:     Effort: Pulmonary  effort is normal. No respiratory distress.  Neurological:     Mental Status: The patient is alert and oriented to person, place, and time.  Psychiatric:        Mood and Affect: Mood normal.        Behavior: Behavior normal.   Assessment and Plan   Acute non-recurrent maxillary sinusitis Allergic sinusitis likely ( plans to be tested for COVID tommorow)... continue allegra, add nasonex and nasal saline. If not improving complete antibiotic for possible bacterial superinfeciton.   I discussed the assessment and treatment plan with the patient. The patient was provided an opportunity to ask questions and all were answered. The patient agreed with the plan and demonstrated an understanding of the instructions.   The patient was advised to call back or seek an in-person evaluation if the symptoms worsen or if the condition fails to improve as anticipated.     Eliezer Lofts, MD

## 2019-08-18 ENCOUNTER — Other Ambulatory Visit: Payer: Self-pay

## 2019-08-18 DIAGNOSIS — Z20822 Contact with and (suspected) exposure to covid-19: Secondary | ICD-10-CM

## 2019-08-18 DIAGNOSIS — J0100 Acute maxillary sinusitis, unspecified: Secondary | ICD-10-CM | POA: Insufficient documentation

## 2019-08-18 NOTE — Assessment & Plan Note (Signed)
Allergic sinusitis likely ( plans to be tested for COVID tommorow)... continue allegra, add nasonex and nasal saline. If not improving complete antibiotic for possible bacterial superinfeciton.

## 2019-08-21 LAB — NOVEL CORONAVIRUS, NAA: SARS-CoV-2, NAA: NOT DETECTED

## 2019-10-08 ENCOUNTER — Ambulatory Visit: Payer: BC Managed Care – PPO | Attending: Internal Medicine

## 2019-10-08 ENCOUNTER — Ambulatory Visit: Payer: BC Managed Care – PPO

## 2019-10-08 DIAGNOSIS — Z23 Encounter for immunization: Secondary | ICD-10-CM | POA: Insufficient documentation

## 2019-10-08 NOTE — Progress Notes (Signed)
   Covid-19 Vaccination Clinic  Name:  Avnoor Wunderle    MRN: [id number] DOB: 1951-08-26  10/08/2019  Ms. Piontek was observed post Covid-19 immunization for 15 minutes without incidence. She was provided with Vaccine Information Sheet and instruction to access the V-Safe system.   Ms. Drozd was instructed to call 911 with any severe reactions post vaccine: Marland Kitchen Difficulty breathing  . Swelling of your face and throat  . A fast heartbeat  . A bad rash all over your body  . Dizziness and weakness    Immunizations Administered    Name Date Dose VIS Date Route   Pfizer COVID-19 Vaccine 10/08/2019 10:12 AM 0.3 mL 08/18/2019 Intramuscular   Manufacturer: [place]   Lot: [id number]   [place]: [id number]

## 2019-10-15 ENCOUNTER — Ambulatory Visit: Payer: BC Managed Care – PPO

## 2019-10-19 ENCOUNTER — Ambulatory Visit: Payer: BC Managed Care – PPO

## 2019-10-30 ENCOUNTER — Ambulatory Visit: Payer: BC Managed Care – PPO

## 2019-11-02 ENCOUNTER — Ambulatory Visit: Payer: BC Managed Care – PPO | Attending: Internal Medicine

## 2019-11-02 DIAGNOSIS — Z23 Encounter for immunization: Secondary | ICD-10-CM

## 2019-11-02 NOTE — Progress Notes (Signed)
   Covid-19 Vaccination Clinic  Name:  Michelle Gill    MRN: [id number] DOB: 10/11/1950  11/02/2019  Ms. Michelle Gill was observed post Covid-19 immunization for 15 minutes without incidence. She was provided with Vaccine Information Sheet and instruction to access the V-Safe system.   Ms. Michelle Gill was instructed to call 911 with any severe reactions post vaccine: Marland Kitchen Difficulty breathing  . Swelling of your face and throat  . A fast heartbeat  . A bad rash all over your body  . Dizziness and weakness    Immunizations Administered    Name Date Dose VIS Date Route   Pfizer COVID-19 Vaccine 11/02/2019 11:40 AM 0.3 mL 08/18/2019 Intramuscular   Manufacturer: [place]   Lot: [id number]   [place]: [id number]

## 2019-11-06 ENCOUNTER — Telehealth: Payer: Self-pay | Admitting: Family Medicine

## 2019-11-06 NOTE — Telephone Encounter (Signed)
error 

## 2019-11-07 ENCOUNTER — Telehealth: Payer: Self-pay | Admitting: Family Medicine

## 2019-11-07 ENCOUNTER — Other Ambulatory Visit: Payer: Self-pay

## 2019-11-07 ENCOUNTER — Other Ambulatory Visit (INDEPENDENT_AMBULATORY_CARE_PROVIDER_SITE_OTHER): Payer: BC Managed Care – PPO

## 2019-11-07 DIAGNOSIS — E7800 Pure hypercholesterolemia, unspecified: Secondary | ICD-10-CM

## 2019-11-07 LAB — LIPID PANEL
Cholesterol: 219 mg/dL — ABNORMAL HIGH (ref 0–200)
HDL: 83.4 mg/dL (ref >39.00)
LDL Cholesterol: 124 mg/dL — ABNORMAL HIGH (ref 0–99)
NonHDL: 135.49
Total CHOL/HDL Ratio: 3
Triglycerides: 55 mg/dL (ref 0.0–149.0)
VLDL: 11 mg/dL (ref 0.0–40.0)

## 2019-11-07 NOTE — Telephone Encounter (Signed)
-----   Message from Cloyd Stagers, RT sent at 11/06/2019  1:57 PM EST ----- Regarding: Lab Orders for Tuesday 3.2.2021 Please place lab orders for Tuesday 3.2.2021, appt notes state "f/u labs cholesterol " Thank you, Dyke Maes RT(R)

## 2019-12-04 ENCOUNTER — Other Ambulatory Visit: Payer: Self-pay | Admitting: Orthopedic Surgery

## 2019-12-04 DIAGNOSIS — M25512 Pain in left shoulder: Secondary | ICD-10-CM | POA: Diagnosis not present

## 2019-12-04 DIAGNOSIS — M542 Cervicalgia: Secondary | ICD-10-CM

## 2019-12-04 DIAGNOSIS — M170 Bilateral primary osteoarthritis of knee: Secondary | ICD-10-CM | POA: Diagnosis not present

## 2019-12-04 DIAGNOSIS — M5412 Radiculopathy, cervical region: Secondary | ICD-10-CM | POA: Diagnosis not present

## 2019-12-17 ENCOUNTER — Ambulatory Visit
Admission: RE | Admit: 2019-12-17 | Discharge: 2019-12-17 | Disposition: A | Discharge status: Home / Self Care | Payer: BC Managed Care – PPO | Source: Ambulatory Visit | Attending: Orthopedic Surgery | Admitting: Orthopedic Surgery

## 2019-12-17 ENCOUNTER — Other Ambulatory Visit: Payer: Self-pay

## 2019-12-17 DIAGNOSIS — M542 Cervicalgia: Secondary | ICD-10-CM

## 2019-12-17 DIAGNOSIS — M4802 Spinal stenosis, cervical region: Secondary | ICD-10-CM | POA: Diagnosis not present

## 2020-01-11 ENCOUNTER — Telehealth: Payer: Self-pay

## 2020-01-11 DIAGNOSIS — M810 Age-related osteoporosis without current pathological fracture: Secondary | ICD-10-CM

## 2020-01-11 NOTE — Telephone Encounter (Signed)
Pt to reapply for 2021 Copay card.  She will then contact her pharmacy to have shipped to [place] and let us know when complete.  Pt had 2d Covid vaccine 11-02-19.

## 2020-01-19 MED ORDER — DENOSUMAB 60 MG/ML ~~LOC~~ SOSY: 60.0000 mg | PREFILLED_SYRINGE | SUBCUTANEOUS | 1 refills | Status: DC

## 2020-01-19 NOTE — Telephone Encounter (Signed)
Pt approved for copay card.  She contacted her pharmacy to order Prolia.  Pt needs refill.  Her pharmacy is requesting a refill for Prolia.

## 2020-01-19 NOTE — Telephone Encounter (Signed)
Pt's last prolia was 08/10/19, next inj due after 02/09/20. Contacted pt and scheduled lab for 5/19 and inj on 6/9. Sent in prolia script to [facility],  [address], per pt request.

## 2020-01-19 NOTE — Addendum Note (Signed)
Addended by: Randall An on: 01/19/2020 04:47 PM   Modules accepted: Orders

## 2020-01-22 ENCOUNTER — Other Ambulatory Visit: Payer: Self-pay

## 2020-01-22 DIAGNOSIS — M810 Age-related osteoporosis without current pathological fracture: Secondary | ICD-10-CM

## 2020-01-22 NOTE — Telephone Encounter (Signed)
Placed order for BMP

## 2020-01-23 NOTE — Telephone Encounter (Signed)
[place] Night - Client Nonclinical Telephone Record AccessNurse Client [place] Night - Client Client Site [place] Physician Eliezer Lofts - MD Contact Type Call Who Is Calling Physician / Provider / Hospital Call Type Provider Call Message Only Reason for Call Request to send message to Office Initial Comment Emison and he would like to schedule a delivery of medication for the patient. Additional Comment Lenyx Cardinas DOB: 10-31-1950 Eddie Dibbles states requested the medication of Friday, May 21st. The phone number [phone] and caller requests a call back from the office when it reopens. Disp. Time Disposition Final User 01/22/2020 7:12:27 PM General Information Provided Yes [place] [facility] Call Closed By: Rachel Moulds [place] Transaction Date/Time: 01/22/2020 7:07:03 PM (ET)

## 2020-01-23 NOTE — Telephone Encounter (Signed)
Returned call and spoke with Edwina P. She verified address and reports prolia will arrive at this clinic on 5/21 between 8 and 1030.

## 2020-01-24 ENCOUNTER — Other Ambulatory Visit (INDEPENDENT_AMBULATORY_CARE_PROVIDER_SITE_OTHER): Payer: BC Managed Care – PPO

## 2020-01-24 DIAGNOSIS — M810 Age-related osteoporosis without current pathological fracture: Secondary | ICD-10-CM

## 2020-01-24 LAB — BASIC METABOLIC PANEL
BUN: 12 mg/dL (ref 6–23)
CO2: 30 mEq/L (ref 19–32)
Calcium: 9.5 mg/dL (ref 8.4–10.5)
Chloride: 99 mEq/L (ref 96–112)
Creatinine, Ser: 0.74 mg/dL (ref 0.40–1.20)
GFR: 93.67 mL/min (ref >60.00)
Glucose, Bld: 100 mg/dL — ABNORMAL HIGH (ref 70–99)
Potassium: 3.8 mEq/L (ref 3.5–5.1)
Sodium: 136 mEq/L (ref 135–145)

## 2020-01-25 NOTE — Telephone Encounter (Signed)
Labs normal. CrCl 67.26mL/min. Pt will have inj on 6/9

## 2020-01-29 NOTE — Telephone Encounter (Signed)
Prolia arrived, labeled for pt and in refridgerator.

## 2020-01-30 ENCOUNTER — Encounter: Payer: Self-pay | Admitting: Family Medicine

## 2020-01-30 ENCOUNTER — Other Ambulatory Visit: Payer: Self-pay

## 2020-01-30 ENCOUNTER — Ambulatory Visit: Payer: BC Managed Care – PPO | Admitting: Family Medicine

## 2020-01-30 VITALS — BP 110/70 | HR 99 | Temp 97.6°F | Ht 63.5 in | Wt 130.2 lb

## 2020-01-30 DIAGNOSIS — R109 Unspecified abdominal pain: Secondary | ICD-10-CM

## 2020-01-30 LAB — POC URINALSYSI DIPSTICK (AUTOMATED)
Bilirubin, UA: NEGATIVE
Blood, UA: NEGATIVE
Glucose, UA: NEGATIVE
Ketones, UA: NEGATIVE
Leukocytes, UA: NEGATIVE
Nitrite, UA: NEGATIVE
Protein, UA: NEGATIVE
Spec Grav, UA: 1.015 (ref 1.010–1.025)
Urobilinogen, UA: 0.2 E.U./dL
pH, UA: 7 (ref 5.0–8.0)

## 2020-01-30 NOTE — Assessment & Plan Note (Signed)
MSK strain versus kidney stone. Urine clear.  Pain improved ? Passed stone.   treat with increase water and pain control prn. If not improving consider non contrast CT to eval for stone.

## 2020-01-30 NOTE — Patient Instructions (Signed)
Tylenol for pain as needed.  Strain urine. Flush out possible stone with increased water intake. Call if pain not improving in next week, or if new burning with urination or fever.

## 2020-01-30 NOTE — Progress Notes (Signed)
Chief Complaint  Patient presents with  . Flank Pain    Right-radiates to groin    History of Present Illness: HPI    69 year old female presents with new onset pain in right flank, radiating to groin on right. She noted after raking out in the yard 5 days ago.. had discomfort in right flank, later that day felt sharp cramps in right flanks. Pain worse with bending over.  Radiated to right groin. Pain scale 9 out of10  No N/V/D.  Pain lasted overnight... gone next morning.  Still has ache and soreness in right flank but very mild.  No RLQ pain  No diarrhea, always some constipation. BM every 2-3 days. Using prune juice.  Hx of kidney stones in the 80s.  Did not take any meds.  This visit occurred during the SARS-CoV-2 public health emergency.  Safety protocols were in place, including screening questions prior to the visit, additional usage of staff PPE, and extensive cleaning of exam room while observing appropriate contact time as indicated for disinfecting solutions.   COVID 19 screen:  No recent travel or known exposure to COVID19 The patient denies respiratory symptoms of COVID 19 at this time. The importance of social distancing was discussed today.     Review of Systems  Constitutional: Negative for chills and fever.  HENT: Negative for congestion and ear pain.   Eyes: Negative for pain and redness.  Respiratory: Negative for cough and shortness of breath.   Cardiovascular: Negative for chest pain, palpitations and leg swelling.  Gastrointestinal: Negative for abdominal pain, blood in stool, constipation, diarrhea, nausea and vomiting.  Genitourinary: Negative for dysuria.  Musculoskeletal: Positive for back pain. Negative for falls and myalgias.  Skin: Negative for rash.  Neurological: Negative for dizziness.  Psychiatric/Behavioral: Negative for depression. The patient is not nervous/anxious.       Past Medical History:  Diagnosis Date  . Anal fissure    . Anemia   . Anxiety   . Asthma    exercise induced  . Hypertension   . Pneumonia 2006    reports that she has never smoked. She has never used smokeless tobacco. She reports that she does not drink alcohol or use drugs.   Current Outpatient Medications:  .  albuterol (VENTOLIN HFA) 108 (90 Base) MCG/ACT inhaler, TAKE 2 PUFFS INTO THE LUNGS EVERY 4 HOURS AS NEEDED, Disp: 18 g, Rfl: 5 .  amoxicillin (AMOXIL) 500 MG capsule, Take 2 capsules (1,000 mg total) by mouth 2 (two) times daily., Disp: 40 capsule, Rfl: 0 .  cetirizine-pseudoephedrine (ZYRTEC-D) 5-120 MG tablet, Take 1 tablet by mouth 2 (two) times daily as needed for allergies., Disp: , Rfl:  .  D3-50 1.25 MG (50000 UT) capsule, TAKE 1 CAPSULE BY MOUTH ONE TIME PER WEEK, Disp: 12 capsule, Rfl: 0 .  denosumab (PROLIA) 60 MG/ML SOSY injection, Inject 60 mg into the skin every 6 (six) months., Disp: 1 mL, Rfl: 1 .  esomeprazole (NEXIUM) 40 MG capsule, Take 40 mg by mouth as needed. For acid reflux, Disp: , Rfl:  .  ferrous sulfate 324 (65 FE) MG TBEC, Take 1 tablet by mouth daily., Disp: , Rfl:  .  hydrochlorothiazide (HYDRODIURIL) 25 MG tablet, TAKE 1 TABLET BY MOUTH DAILY WITH AN ADDITIONAL HALF TAB AS NEEDED FOR SWELLING, Disp: 135 tablet, Rfl: 3 .  omega-3 acid ethyl esters (LOVAZA) 1 g capsule, Take by mouth daily., Disp: , Rfl:  .  triamcinolone (NASACORT) 55 MCG/ACT AERO  nasal inhaler, PLACE 2 SPRAYS IN EACH NOSTRIL ONCE A DAY FOR ALLERGIES (Patient taking differently: PLACE 2 SPRAYS IN EACH NOSTRIL ONCE A DAY FOR ALLERGIES as needed), Disp: 16.9 mL, Rfl: 5   Observations/Objective: Temperature 97.6 F (36.4 C), temperature source Temporal, height 5' 3.5" (1.613 m), weight 130 lb 4 oz (59.1 kg).  Physical Exam Constitutional:      General: She is not in acute distress.    Appearance: Normal appearance. She is well-developed. She is not ill-appearing or toxic-appearing.  HENT:     Head: Normocephalic.     Right Ear: Hearing,  tympanic membrane, ear canal and external ear normal. Tympanic membrane is not erythematous, retracted or bulging.     Left Ear: Hearing, tympanic membrane, ear canal and external ear normal. Tympanic membrane is not erythematous, retracted or bulging.     Nose: No mucosal edema or rhinorrhea.     Right Sinus: No maxillary sinus tenderness or frontal sinus tenderness.     Left Sinus: No maxillary sinus tenderness or frontal sinus tenderness.     Mouth/Throat:     Pharynx: Uvula midline.  Eyes:     General: Lids are normal. Lids are everted, no foreign bodies appreciated.     Conjunctiva/sclera: Conjunctivae normal.     Pupils: Pupils are equal, round, and reactive to light.  Neck:     Thyroid: No thyroid mass or thyromegaly.     Vascular: No carotid bruit.     Trachea: Trachea normal.  Cardiovascular:     Rate and Rhythm: Normal rate and regular rhythm.     Pulses: Normal pulses.     Heart sounds: Normal heart sounds, S1 normal and S2 normal. No murmur. No friction rub. No gallop.   Pulmonary:     Effort: Pulmonary effort is normal. No tachypnea or respiratory distress.     Breath sounds: Normal breath sounds. No decreased breath sounds, wheezing, rhonchi or rales.  Abdominal:     General: Bowel sounds are normal.     Palpations: Abdomen is soft.     Tenderness: There is no abdominal tenderness. There is no right CVA tenderness or left CVA tenderness.     Comments: Slight soreness in right flank, no RUQ or RLQ pain  Musculoskeletal:     Cervical back: Normal range of motion and neck supple.     Lumbar back: Normal. Negative right straight leg raise test.  Skin:    General: Skin is warm and dry.     Findings: No rash.  Neurological:     Mental Status: She is alert.  Psychiatric:        Mood and Affect: Mood is not anxious or depressed.        Speech: Speech normal.        Behavior: Behavior normal. Behavior is cooperative.        Thought Content: Thought content normal.         Judgment: Judgment normal.      Assessment and Plan   Right flank pain MSK strain versus kidney stone. Urine clear.  Pain improved ? Passed stone.   treat with increase water and pain control prn. If not improving consider non contrast CT to eval for stone.     Eliezer Lofts, MD

## 2020-02-14 ENCOUNTER — Ambulatory Visit (INDEPENDENT_AMBULATORY_CARE_PROVIDER_SITE_OTHER): Payer: BC Managed Care – PPO

## 2020-02-14 DIAGNOSIS — M810 Age-related osteoporosis without current pathological fracture: Secondary | ICD-10-CM

## 2020-02-14 MED ORDER — DENOSUMAB 60 MG/ML ~~LOC~~ SOSY
60.0000 mg | PREFILLED_SYRINGE | Freq: Once | SUBCUTANEOUS | Status: AC
  Administered 2020-02-14: 60 mg via SUBCUTANEOUS

## 2020-02-14 NOTE — Progress Notes (Signed)
Per orders of Dr. Lorelei Pont in Dr. Rometta Emery absence, injection of Prolia given by Randall An. Patient tolerated injection well.

## 2020-04-11 ENCOUNTER — Encounter: Payer: Self-pay | Admitting: Family Medicine

## 2020-04-11 ENCOUNTER — Ambulatory Visit: Payer: BC Managed Care – PPO | Admitting: Family Medicine

## 2020-04-11 ENCOUNTER — Other Ambulatory Visit: Payer: Self-pay

## 2020-04-11 VITALS — BP 110/70 | HR 84 | Temp 98.4°F | Ht 63.5 in | Wt 128.2 lb

## 2020-04-11 DIAGNOSIS — M545 Low back pain, unspecified: Secondary | ICD-10-CM

## 2020-04-11 MED ORDER — CYCLOBENZAPRINE HCL 10 MG PO TABS: 10.0000 mg | ORAL_TABLET | Freq: Every evening | ORAL | 0 refills | Status: DC | PRN

## 2020-04-11 MED ORDER — PREDNISONE 20 MG PO TABS
ORAL_TABLET | ORAL | 0 refills | Status: DC
Start: 2020-04-11 — End: 2020-06-28

## 2020-04-11 NOTE — Patient Instructions (Addendum)
Heat/ Ice on low back.  Complete prednisone taper, can use muscle relaxant at night as needed for muscle spasm.  Hold aleve.  Start home physical therapy.  Call if not improving in 2 weeks.

## 2020-04-11 NOTE — Assessment & Plan Note (Signed)
Muscle spasm and Paraspinous muscle tenderness most prominent. Treat with heat/ice, home PT, pred taper and muscle relaxant prn.

## 2020-04-11 NOTE — Progress Notes (Signed)
Chief Complaint  Patient presents with  . Back Pain    hurt moving a bag of mulch 2 weeks ago    History of Present Illness: HPI   69 year old female presents with new onset low back pain.Marland Kitchen started following moving bags of mulch 2 weeks ago.  No known fall.  She reports she noted low back spasming, bilateral midline at top of buttocks.  Using heat, Pressure in low back with sitting. Increase in pain with bending over or lifting feet up.   No numbness in leg, no weakness in legs.  No fever.  She has had some pain intermittently on  Left side to leg.   she has been using aleve once daily, ice and heat. No history of back issues or surgeries.    This visit occurred during the SARS-CoV-2 public health emergency.  Safety protocols were in place, including screening questions prior to the visit, additional usage of staff PPE, and extensive cleaning of exam room while observing appropriate contact time as indicated for disinfecting solutions.   COVID 19 screen:  No recent travel or known exposure to COVID19 The patient denies respiratory symptoms of COVID 19 at this time. The importance of social distancing was discussed today.     Review of Systems  Constitutional: Negative for chills and fever.  HENT: Negative for congestion and ear pain.   Eyes: Negative for pain and redness.  Respiratory: Negative for cough and shortness of breath.   Cardiovascular: Negative for chest pain, palpitations and leg swelling.  Gastrointestinal: Negative for abdominal pain, blood in stool, constipation, diarrhea, nausea and vomiting.  Genitourinary: Negative for dysuria.  Musculoskeletal: Positive for back pain. Negative for falls and myalgias.  Skin: Negative for rash.  Neurological: Negative for dizziness.  Psychiatric/Behavioral: Negative for depression. The patient is not nervous/anxious.       Past Medical History:  Diagnosis Date  . Anal fissure   . Anemia   . Anxiety   . Asthma     exercise induced  . Hypertension   . Pneumonia 2006    reports that she has never smoked. She has never used smokeless tobacco. She reports that she does not drink alcohol and does not use drugs.   Current Outpatient Medications:  .  albuterol (VENTOLIN HFA) 108 (90 Base) MCG/ACT inhaler, TAKE 2 PUFFS INTO THE LUNGS EVERY 4 HOURS AS NEEDED, Disp: 18 g, Rfl: 5 .  cetirizine-pseudoephedrine (ZYRTEC-D) 5-120 MG tablet, Take 1 tablet by mouth 2 (two) times daily as needed for allergies., Disp: , Rfl:  .  D3-50 1.25 MG (50000 UT) capsule, TAKE 1 CAPSULE BY MOUTH ONE TIME PER WEEK, Disp: 12 capsule, Rfl: 0 .  denosumab (PROLIA) 60 MG/ML SOSY injection, Inject 60 mg into the skin every 6 (six) months., Disp: 1 mL, Rfl: 1 .  esomeprazole (NEXIUM) 40 MG capsule, Take 40 mg by mouth as needed. For acid reflux, Disp: , Rfl:  .  ferrous sulfate 324 (65 FE) MG TBEC, Take 1 tablet by mouth daily., Disp: , Rfl:  .  hydrochlorothiazide (HYDRODIURIL) 25 MG tablet, TAKE 1 TABLET BY MOUTH DAILY WITH AN ADDITIONAL HALF TAB AS NEEDED FOR SWELLING, Disp: 135 tablet, Rfl: 3 .  omega-3 acid ethyl esters (LOVAZA) 1 g capsule, Take by mouth daily., Disp: , Rfl:  .  triamcinolone (NASACORT) 55 MCG/ACT AERO nasal inhaler, PLACE 2 SPRAYS IN EACH NOSTRIL ONCE A DAY FOR ALLERGIES (Patient taking differently: PLACE 2 SPRAYS IN EACH NOSTRIL ONCE A  DAY FOR ALLERGIES as needed), Disp: 16.9 mL, Rfl: 5   Observations/Objective: Blood pressure 110/70, pulse 84, temperature 98.4 F (36.9 C), temperature source Temporal, height 5' 3.5" (1.613 m), weight 128 lb 4 oz (58.2 kg), SpO2 98 %.  Physical Exam Constitutional:      General: She is not in acute distress.    Appearance: Normal appearance. She is well-developed. She is not ill-appearing or toxic-appearing.  HENT:     Head: Normocephalic.     Right Ear: Hearing, tympanic membrane, ear canal and external ear normal. Tympanic membrane is not erythematous, retracted or  bulging.     Left Ear: Hearing, tympanic membrane, ear canal and external ear normal. Tympanic membrane is not erythematous, retracted or bulging.     Nose: No mucosal edema or rhinorrhea.     Right Sinus: No maxillary sinus tenderness or frontal sinus tenderness.     Left Sinus: No maxillary sinus tenderness or frontal sinus tenderness.     Mouth/Throat:     Pharynx: Uvula midline.  Eyes:     General: Lids are normal. Lids are everted, no foreign bodies appreciated.     Conjunctiva/sclera: Conjunctivae normal.     Pupils: Pupils are equal, round, and reactive to light.  Neck:     Thyroid: No thyroid mass or thyromegaly.     Vascular: No carotid bruit.     Trachea: Trachea normal.  Cardiovascular:     Rate and Rhythm: Normal rate and regular rhythm.     Pulses: Normal pulses.     Heart sounds: Normal heart sounds, S1 normal and S2 normal. No murmur heard.  No friction rub. No gallop.   Pulmonary:     Effort: Pulmonary effort is normal. No tachypnea or respiratory distress.     Breath sounds: Normal breath sounds. No decreased breath sounds, wheezing, rhonchi or rales.  Abdominal:     General: Bowel sounds are normal.     Palpations: Abdomen is soft.     Tenderness: There is no abdominal tenderness.  Musculoskeletal:     Cervical back: Normal, normal range of motion and neck supple.     Thoracic back: Normal.     Lumbar back: Spasms, tenderness and bony tenderness present. No deformity. Decreased range of motion. Negative right straight leg raise test and negative left straight leg raise test.  Skin:    General: Skin is warm and dry.     Findings: No rash.  Neurological:     Mental Status: She is alert and oriented to person, place, and time.     Cranial Nerves: No cranial nerve deficit.     Sensory: No sensory deficit.     Motor: No abnormal muscle tone.     Coordination: Coordination normal.     Gait: Gait abnormal.     Deep Tendon Reflexes: Reflexes are normal and  symmetric.     Comments: Nml cerebellar exam   No papilledema  Psychiatric:        Mood and Affect: Mood is not anxious or depressed.        Speech: Speech normal.        Behavior: Behavior normal. Behavior is cooperative.        Thought Content: Thought content normal.        Cognition and Memory: Memory is not impaired. She does not exhibit impaired recent memory or impaired remote memory.        Judgment: Judgment normal.      Assessment and Plan  Acute bilateral low back pain without sciatica Muscle spasm and Paraspinous muscle tenderness most prominent. Treat with heat/ice, home PT, pred taper and muscle relaxant prn.  No indication for X-ray at this time.   Eliezer Lofts, MD

## 2020-05-08 ENCOUNTER — Other Ambulatory Visit: Payer: Self-pay | Admitting: Family Medicine

## 2020-05-22 ENCOUNTER — Ambulatory Visit (INDEPENDENT_AMBULATORY_CARE_PROVIDER_SITE_OTHER): Payer: BC Managed Care – PPO

## 2020-05-22 ENCOUNTER — Other Ambulatory Visit: Payer: Self-pay

## 2020-05-22 DIAGNOSIS — Z23 Encounter for immunization: Secondary | ICD-10-CM

## 2020-05-29 ENCOUNTER — Other Ambulatory Visit: Payer: Self-pay | Admitting: Family Medicine

## 2020-05-30 DIAGNOSIS — Z1231 Encounter for screening mammogram for malignant neoplasm of breast: Secondary | ICD-10-CM | POA: Diagnosis not present

## 2020-05-30 LAB — HM MAMMOGRAPHY

## 2020-06-07 ENCOUNTER — Encounter: Payer: Self-pay | Admitting: Family Medicine

## 2020-06-10 ENCOUNTER — Telehealth: Payer: Self-pay | Admitting: *Deleted

## 2020-06-10 NOTE — Telephone Encounter (Signed)
Received fax from [facility] stating PA for Prolia is about to expire.  PA completed on CoverMyMeds and sent for review.  Can take up to 72 hours for a decision

## 2020-06-13 ENCOUNTER — Ambulatory Visit: Payer: BC Managed Care – PPO | Attending: Internal Medicine

## 2020-06-13 DIAGNOSIS — Z23 Encounter for immunization: Secondary | ICD-10-CM

## 2020-06-13 NOTE — Progress Notes (Signed)
   Covid-19 Vaccination Clinic  Name:  Naudia Crosley    MRN: [id number] DOB: 1951/07/23  06/13/2020  Ms. Rumsey was observed post Covid-19 immunization for 15 minutes without incident. She was provided with Vaccine Information Sheet and instruction to access the V-Safe system.   Ms. Sofia was instructed to call 911 with any severe reactions post vaccine: Marland Kitchen Difficulty breathing  . Swelling of face and throat  . A fast heartbeat  . A bad rash all over body  . Dizziness and weakness

## 2020-06-19 ENCOUNTER — Telehealth: Payer: Self-pay | Admitting: Family Medicine

## 2020-06-19 DIAGNOSIS — D508 Other iron deficiency anemias: Secondary | ICD-10-CM

## 2020-06-19 DIAGNOSIS — E559 Vitamin D deficiency, unspecified: Secondary | ICD-10-CM

## 2020-06-19 DIAGNOSIS — E7800 Pure hypercholesterolemia, unspecified: Secondary | ICD-10-CM

## 2020-06-19 NOTE — Telephone Encounter (Signed)
-----   Message from Ellamae Sia sent at 06/05/2020 12:59 PM EDT ----- Regarding: Lab orders for Friday, 10.15.21 Patient is scheduled for CPX labs, please order future labs, Thanks , Karna Christmas

## 2020-06-21 ENCOUNTER — Other Ambulatory Visit (INDEPENDENT_AMBULATORY_CARE_PROVIDER_SITE_OTHER): Payer: BC Managed Care – PPO

## 2020-06-21 ENCOUNTER — Other Ambulatory Visit: Payer: Self-pay

## 2020-06-21 DIAGNOSIS — E7800 Pure hypercholesterolemia, unspecified: Secondary | ICD-10-CM

## 2020-06-21 DIAGNOSIS — M810 Age-related osteoporosis without current pathological fracture: Secondary | ICD-10-CM | POA: Diagnosis not present

## 2020-06-21 DIAGNOSIS — E559 Vitamin D deficiency, unspecified: Secondary | ICD-10-CM

## 2020-06-21 DIAGNOSIS — D508 Other iron deficiency anemias: Secondary | ICD-10-CM

## 2020-06-21 LAB — CBC WITH DIFFERENTIAL/PLATELET
Basophils Absolute: 0.1 10*3/uL (ref 0.0–0.1)
Basophils Relative: 1.1 % (ref 0.0–3.0)
Eosinophils Absolute: 0.2 10*3/uL (ref 0.0–0.7)
Eosinophils Relative: 3.9 % (ref 0.0–5.0)
HCT: 38.2 % (ref 36.0–46.0)
Hemoglobin: 12.4 g/dL (ref 12.0–15.0)
Lymphocytes Relative: 36 % (ref 12.0–46.0)
Lymphs Abs: 1.7 10*3/uL (ref 0.7–4.0)
MCHC: 32.3 g/dL (ref 30.0–36.0)
MCV: 74.7 fl — ABNORMAL LOW (ref 78.0–100.0)
Monocytes Absolute: 0.4 10*3/uL (ref 0.1–1.0)
Monocytes Relative: 8.3 % (ref 3.0–12.0)
Neutro Abs: 2.5 10*3/uL (ref 1.4–7.7)
Neutrophils Relative %: 50.7 % (ref 43.0–77.0)
Platelets: 182 10*3/uL (ref 150.0–400.0)
RBC: 5.12 Mil/uL — ABNORMAL HIGH (ref 3.87–5.11)
RDW: 13.8 % (ref 11.5–15.5)
WBC: 4.8 10*3/uL (ref 4.0–10.5)

## 2020-06-21 LAB — BASIC METABOLIC PANEL
BUN: 9 mg/dL (ref 6–23)
CO2: 31 mEq/L (ref 19–32)
Calcium: 9.8 mg/dL (ref 8.4–10.5)
Chloride: 100 mEq/L (ref 96–112)
Creatinine, Ser: 0.66 mg/dL (ref 0.40–1.20)
GFR: 88.75 mL/min (ref >60.00)
Glucose, Bld: 87 mg/dL (ref 70–99)
Potassium: 3.6 mEq/L (ref 3.5–5.1)
Sodium: 139 mEq/L (ref 135–145)

## 2020-06-21 LAB — COMPREHENSIVE METABOLIC PANEL
ALT: 13 U/L (ref 0–35)
AST: 19 U/L (ref 0–37)
Albumin: 4.3 g/dL (ref 3.5–5.2)
Alkaline Phosphatase: 62 U/L (ref 39–117)
BUN: 9 mg/dL (ref 6–23)
CO2: 31 mEq/L (ref 19–32)
Calcium: 9.8 mg/dL (ref 8.4–10.5)
Chloride: 100 mEq/L (ref 96–112)
Creatinine, Ser: 0.66 mg/dL (ref 0.40–1.20)
GFR: 88.75 mL/min (ref >60.00)
Glucose, Bld: 87 mg/dL (ref 70–99)
Potassium: 3.6 mEq/L (ref 3.5–5.1)
Sodium: 139 mEq/L (ref 135–145)
Total Bilirubin: 1.2 mg/dL (ref 0.2–1.2)
Total Protein: 6.4 g/dL (ref 6.0–8.3)

## 2020-06-21 LAB — LIPID PANEL
Cholesterol: 211 mg/dL — ABNORMAL HIGH (ref 0–200)
HDL: 88.5 mg/dL (ref >39.00)
LDL Cholesterol: 112 mg/dL — ABNORMAL HIGH (ref 0–99)
NonHDL: 122.03
Total CHOL/HDL Ratio: 2
Triglycerides: 52 mg/dL (ref 0.0–149.0)
VLDL: 10.4 mg/dL (ref 0.0–40.0)

## 2020-06-21 LAB — VITAMIN D 25 HYDROXY (VIT D DEFICIENCY, FRACTURES): VITD: 45.57 ng/mL (ref 30.00–100.00)

## 2020-06-21 NOTE — Progress Notes (Signed)
No critical labs need to be addressed urgently. We will discuss labs in detail at upcoming office visit.   

## 2020-06-28 ENCOUNTER — Ambulatory Visit (INDEPENDENT_AMBULATORY_CARE_PROVIDER_SITE_OTHER): Payer: BC Managed Care – PPO | Admitting: Family Medicine

## 2020-06-28 ENCOUNTER — Other Ambulatory Visit: Payer: Self-pay

## 2020-06-28 ENCOUNTER — Encounter: Payer: Self-pay | Admitting: Family Medicine

## 2020-06-28 VITALS — BP 100/68 | HR 90 | Temp 98.0°F | Ht 62.5 in | Wt 128.2 lb

## 2020-06-28 DIAGNOSIS — Z0000 Encounter for general adult medical examination without abnormal findings: Secondary | ICD-10-CM | POA: Diagnosis not present

## 2020-06-28 DIAGNOSIS — E7800 Pure hypercholesterolemia, unspecified: Secondary | ICD-10-CM

## 2020-06-28 DIAGNOSIS — E559 Vitamin D deficiency, unspecified: Secondary | ICD-10-CM

## 2020-06-28 DIAGNOSIS — Z23 Encounter for immunization: Secondary | ICD-10-CM | POA: Diagnosis not present

## 2020-06-28 DIAGNOSIS — R718 Other abnormality of red blood cells: Secondary | ICD-10-CM | POA: Diagnosis not present

## 2020-06-28 DIAGNOSIS — M810 Age-related osteoporosis without current pathological fracture: Secondary | ICD-10-CM

## 2020-06-28 DIAGNOSIS — I10 Essential (primary) hypertension: Secondary | ICD-10-CM | POA: Diagnosis not present

## 2020-06-28 LAB — IBC + FERRITIN
Ferritin: 154.8 ng/mL (ref 10.0–291.0)
Iron: 141 ug/dL (ref 42–145)
Saturation Ratios: 48.9 % (ref 20.0–50.0)
Transferrin: 206 mg/dL — ABNORMAL LOW (ref 212.0–360.0)

## 2020-06-28 MED ORDER — LISINOPRIL 10 MG PO TABS: 10.0000 mg | ORAL_TABLET | Freq: Every day | ORAL | 3 refills | Status: DC

## 2020-06-28 NOTE — Patient Instructions (Addendum)
Plan setting up Cologuard. Call to set up bone density.  Please call the location of your choice from the menu below to schedule your Mammogram and/or Bone Density appointment.    [place]   1. [facility]                      Phone:  [phone] [address], [place] [address]                                                             Services: Traditional and 3D Mammogram, Bone Density   2. [place] [facility]                 Phone: [phone] [address], [place] [address]    Service: Bone Density ONLY   *this site does NOT perform mammograms  3. [place]                        Phone:  [phone] [address] [place], [place] [address]                                            Services:  3D Mammogram and Bone Density    [place]  1. [place] [facility]   Phone:  [phone]   [place], [place] [address]                                            Services: 3D Mammogram and Bone Density  2. [place] at [facility])  Phone:  [phone]   [address] [place], [place] [address]  Services:  3D Mammogram and Bone Density

## 2020-06-28 NOTE — Progress Notes (Signed)
Chief Complaint  Patient presents with  . Annual Exam    History of Present Illness: HPI   The patient presents for complete physical and review of chronic health problems. He/She also has the following acute concerns today:  She has been feeling more tired lately in last 4-5 days.  Hypertension:    At goal on HCTZ BP Readings from Last 3 Encounters:  06/28/20 100/68  04/11/20 110/70  01/30/20 110/70  Using medication without problems or lightheadedness:  none Chest pain with exertion:none Edema:none Short of breath:none Average home BPs: Other issues:  Drinking a lot of water. Wt Readings from Last 3 Encounters:  06/28/20 128 lb 4 oz (58.2 kg)  04/11/20 128 lb 4 oz (58.2 kg)  01/30/20 130 lb 4 oz (59.1 kg)     Elevated Cholesterol:  Lab Results  Component Value Date   CHOL 211 (H) 06/21/2020   HDL 88.50 06/21/2020   LDLCALC 112 (H) 06/21/2020   LDLDIRECT 116.0 05/24/2013   TRIG 52.0 06/21/2020   CHOLHDL 2 06/21/2020  Using medications without problems: Muscle aches:  Diet compliance: good Exercise:walking 5 days a week Other complaints: The 10-year ASCVD risk score Mikey Bussing DC Brooke Bonito., et al., 2013) is: 9.2%   Values used to calculate the score:     Age: 69 years     Sex: Female     Is Non-Hispanic African American: Yes     Diabetic: No     Tobacco smoker: No     Systolic Blood Pressure: 481 mmHg     Is BP treated: Yes     HDL Cholesterol: 88.5 mg/dL     Total Cholesterol: 211 mg/dL  Vit D: on supplement.   She is taking iron 325 mg daily.  This visit occurred during the SARS-CoV-2 public health emergency.  Safety protocols were in place, including screening questions prior to the visit, additional usage of staff PPE, and extensive cleaning of exam room while observing appropriate contact time as indicated for disinfecting solutions.   COVID 19 screen:  No recent travel or known exposure to COVID19 The patient denies respiratory symptoms of COVID 19 at this  time. The importance of social distancing was discussed today.     Review of Systems  Constitutional: Negative for chills and fever.  HENT: Negative for congestion and ear pain.   Eyes: Negative for pain and redness.  Respiratory: Negative for cough and shortness of breath.   Cardiovascular: Negative for chest pain, palpitations and leg swelling.  Gastrointestinal: Negative for abdominal pain, blood in stool, constipation, diarrhea, nausea and vomiting.  Genitourinary: Negative for dysuria.  Musculoskeletal: Negative for falls and myalgias.  Skin: Negative for rash.  Neurological: Negative for dizziness.  Psychiatric/Behavioral: Negative for depression. The patient is not nervous/anxious.       Past Medical History:  Diagnosis Date  . Anal fissure   . Anemia   . Anxiety   . Asthma    exercise induced  . Hypertension   . Pneumonia 2006    reports that she has never smoked. She has never used smokeless tobacco. She reports that she does not drink alcohol and does not use drugs.   Current Outpatient Medications:  .  albuterol (VENTOLIN HFA) 108 (90 Base) MCG/ACT inhaler, TAKE 2 PUFFS INTO THE LUNGS EVERY 4 HOURS AS NEEDED, Disp: 18 g, Rfl: 2 .  cetirizine-pseudoephedrine (ZYRTEC-D) 5-120 MG tablet, Take 1 tablet by mouth daily as needed for allergies. , Disp: , Rfl:  .  D3-50  1.25 MG (50000 UT) capsule, TAKE 1 CAPSULE BY MOUTH ONE TIME PER WEEK, Disp: 12 capsule, Rfl: 0 .  denosumab (PROLIA) 60 MG/ML SOSY injection, Inject 60 mg into the skin every 6 (six) months., Disp: 1 mL, Rfl: 1 .  esomeprazole (NEXIUM) 40 MG capsule, Take 40 mg by mouth as needed. For acid reflux, Disp: , Rfl:  .  ferrous sulfate 324 (65 FE) MG TBEC, Take 1 tablet by mouth daily., Disp: , Rfl:  .  hydrochlorothiazide (HYDRODIURIL) 25 MG tablet, TAKE 1 TABLET BY MOUTH DAILY WITH AN ADDITIONAL HALF TAB AS NEEDED FOR SWELLING, Disp: 135 tablet, Rfl: 1 .  omega-3 acid ethyl esters (LOVAZA) 1 g capsule, Take by  mouth daily., Disp: , Rfl:  .  triamcinolone (NASACORT) 55 MCG/ACT AERO nasal inhaler, PLACE 2 SPRAYS IN EACH NOSTRIL ONCE A DAY FOR ALLERGIES (Patient taking differently: PLACE 2 SPRAYS IN EACH NOSTRIL ONCE A DAY FOR ALLERGIES as needed), Disp: 16.9 mL, Rfl: 5   Observations/Objective: Blood pressure 100/68, pulse 90, temperature 98 F (36.7 C), temperature source Temporal, height 5' 2.5" (1.588 m), weight 128 lb 4 oz (58.2 kg), SpO2 97 %.  Physical Exam Constitutional:      General: She is not in acute distress.Vital signs are normal.     Appearance: Normal appearance. She is well-developed and well-nourished. She is not ill-appearing or toxic-appearing.  HENT:     Head: Normocephalic.     Right Ear: Hearing, tympanic membrane, ear canal and external ear normal. Tympanic membrane is not erythematous, retracted or bulging.     Left Ear: Hearing, tympanic membrane, ear canal and external ear normal. Tympanic membrane is not erythematous, retracted or bulging.     Nose: No mucosal edema or rhinorrhea.     Right Sinus: No maxillary sinus tenderness or frontal sinus tenderness.     Left Sinus: No maxillary sinus tenderness or frontal sinus tenderness.     Mouth/Throat:     Mouth: Oropharynx is clear and moist and mucous membranes are normal.     Pharynx: Uvula midline.  Eyes:     General: Lids are normal. Lids are everted, no foreign bodies appreciated.     Extraocular Movements: EOM normal.     Conjunctiva/sclera: Conjunctivae normal.     Pupils: Pupils are equal, round, and reactive to light.  Neck:     Thyroid: No thyroid mass or thyromegaly.     Vascular: No carotid bruit.     Trachea: Trachea normal.  Cardiovascular:     Rate and Rhythm: Normal rate and regular rhythm.     Pulses: Normal pulses and intact distal pulses.     Heart sounds: Normal heart sounds, S1 normal and S2 normal. No murmur heard. No friction rub. No gallop.   Pulmonary:     Effort: Pulmonary effort is normal.  No tachypnea or respiratory distress.     Breath sounds: Normal breath sounds. No decreased breath sounds, wheezing, rhonchi or rales.  Abdominal:     General: Bowel sounds are normal.     Palpations: Abdomen is soft.     Tenderness: There is no abdominal tenderness.  Musculoskeletal:     Cervical back: Normal range of motion and neck supple.  Skin:    General: Skin is warm, dry and intact.     Findings: No rash.  Neurological:     Mental Status: She is alert.  Psychiatric:        Mood and Affect: Mood is not anxious  or depressed.        Speech: Speech normal.        Behavior: Behavior normal. Behavior is cooperative.        Thought Content: Thought content normal.        Cognition and Memory: Cognition and memory normal.        Judgment: Judgment normal.      Assessment and Plan   The patient's preventative maintenance and recommended screening tests for an annual wellness exam were reviewed in full today. Brought up to date unless services declined.  Counselled on the importance of diet, exercise, and its role in overall health and mortality. The patient's FH and SH was reviewed, including their home life, tobacco status, and drug and alcohol status.   Mammogramnegative 05/30/2020 Colonoscopy 2010, Dr. Deatra Ina, plans repeat in 2020. .. due.  Cologuard Last DEXA:7/2019improved and almost in osteopenia range SE to evista and SE fosamax.  On prolia now .. due for re-eval. PAP/DVE: no pap indicated partial hystec, Vaccines:uptodate with PNA, flu, zoster ( can consider shingrix as well), COVID x 3 prevnar .Marland Kitchen Td given today Nonsmoker Hep C:done  Problem List Items Addressed This Visit    Benign essential HTN    Well controlled. Continue current medication.   Losartan 25 mg daily       Relevant Medications   lisinopril (ZESTRIL) 10 MG tablet   High cholesterol    Improved 10 year risk.. now down to 9.2% risk.. she is not interested in statin to treat at this level  given good HDL/LDL ratio. Encouraged exercise, weight loss, healthy eating habits.       Relevant Medications   lisinopril (ZESTRIL) 10 MG tablet   Osteoporosis    Due for repeat DEXA, chronic.  Continue current medication.   Prolia  60 mg every 6 months      Relevant Orders   DG Bone Density   Vitamin D deficiency    Chronic, stable on supplement.       Other Visit Diagnoses    Routine general medical examination at a health care facility    -  Primary   Need for Td vaccine       Relevant Orders   Td vaccine greater than or equal to 7yo preservative free IM (Completed)   Low mean corpuscular volume (MCV)       Relevant Orders   IBC + Ferritin (Completed)      Eliezer Lofts, MD

## 2020-06-28 NOTE — Assessment & Plan Note (Signed)
Improved 10 year risk.. now down to 9.2% risk.. she is not interested in statin to treat at this level given good HDL/LDL ratio. Encouraged exercise, weight loss, healthy eating habits.

## 2020-06-28 NOTE — Assessment & Plan Note (Addendum)
Well controlled. Continue current medication.   Losartan 25 mg daily

## 2020-07-01 DIAGNOSIS — R718 Other abnormality of red blood cells: Secondary | ICD-10-CM

## 2020-07-01 DIAGNOSIS — E611 Iron deficiency: Secondary | ICD-10-CM

## 2020-07-04 NOTE — Telephone Encounter (Signed)
Pt called checking on referral and would like a call back  Best number [phone]  Cell [phone]

## 2020-07-04 NOTE — Telephone Encounter (Signed)
Please call this pt to let her know referral place if she does not view MyChart note by 10/29 given she requested call.

## 2020-07-05 NOTE — Telephone Encounter (Signed)
Mrs. Beane notified as instructed by telephone.

## 2020-07-09 ENCOUNTER — Telehealth: Payer: Self-pay | Admitting: Internal Medicine

## 2020-07-09 NOTE — Telephone Encounter (Signed)
Received a new hem referral from Dr. Diona Browner for low iron. Pt scheduled to see Dr. Julien Nordmann on 11/30 at 2:15pm w/labs at 145pm. Pt did not want to see a PA or NP. Aware to arrive 30 minutes early.

## 2020-07-15 DIAGNOSIS — Z1212 Encounter for screening for malignant neoplasm of rectum: Secondary | ICD-10-CM | POA: Diagnosis not present

## 2020-07-15 DIAGNOSIS — Z1211 Encounter for screening for malignant neoplasm of colon: Secondary | ICD-10-CM | POA: Diagnosis not present

## 2020-07-15 LAB — COLOGUARD: Cologuard: NEGATIVE

## 2020-07-18 ENCOUNTER — Telehealth: Payer: Self-pay | Admitting: Family Medicine

## 2020-07-18 NOTE — Telephone Encounter (Signed)
Pt called in stating the prolia medication should be here by 11/17 and she needs to make ana appointment

## 2020-07-18 NOTE — Telephone Encounter (Signed)
Nurse visit scheduled for patient.  Advised patient to call and be sure injection was shipped before coming in for appointment to prevent any delay.

## 2020-07-19 ENCOUNTER — Telehealth: Payer: Self-pay

## 2020-07-19 DIAGNOSIS — M810 Age-related osteoporosis without current pathological fracture: Secondary | ICD-10-CM

## 2020-07-19 NOTE — Telephone Encounter (Signed)
Rx specialty pharmacy left v/m requesting cb to schedule delivery of prefilled Prolia syringe to [facility]. Per appt notes pt has nurse visit scheduled on 07/31/20 for prolia injection. Sending note to [facility] CMA.

## 2020-07-25 DIAGNOSIS — M85851 Other specified disorders of bone density and structure, right thigh: Secondary | ICD-10-CM | POA: Diagnosis not present

## 2020-07-25 DIAGNOSIS — M810 Age-related osteoporosis without current pathological fracture: Secondary | ICD-10-CM | POA: Diagnosis not present

## 2020-07-25 DIAGNOSIS — M85852 Other specified disorders of bone density and structure, left thigh: Secondary | ICD-10-CM | POA: Diagnosis not present

## 2020-07-25 LAB — COLOGUARD: COLOGUARD: NEGATIVE

## 2020-07-25 LAB — HM DEXA SCAN

## 2020-07-25 NOTE — Telephone Encounter (Signed)
These labs would  be okay to use.

## 2020-07-25 NOTE — Telephone Encounter (Signed)
Meds have been received in fridge

## 2020-07-25 NOTE — Telephone Encounter (Signed)
Pt had last prolia inj on 02/14/20 so next injection is not due until after 08/13/20. Injection apt will need to be moved out.  Pt has apt on 11/24 for inj but that is too soon. Last labs were done on 10/15 which were normal but too far out from inj. Ca is normal and CrCl is 71.83. Will forward to PCP to make sure it is ok to use these labs.

## 2020-07-26 ENCOUNTER — Ambulatory Visit: Payer: Self-pay

## 2020-07-26 NOTE — Progress Notes (Signed)
A user error has taken place: encounter opened in error, closed for administrative reasons.

## 2020-07-26 NOTE — Telephone Encounter (Signed)
Contacted pt and advised apt for inj needed to be moved back. Pt agreed and apt has been RS to 12/15. Pt verbalized understanding.

## 2020-07-29 ENCOUNTER — Encounter: Payer: Self-pay | Admitting: Family Medicine

## 2020-07-31 ENCOUNTER — Ambulatory Visit: Payer: BC Managed Care – PPO

## 2020-07-31 NOTE — Telephone Encounter (Signed)
Notes on form states that Michelle Gill has been received but do not see ppw with it. Do you know where it is?  Benefits submitted: Yes  Prior authorization needed: Yes   Out of pocket for patient:  Pending benefits submitted on 07/31/2020  Lab appointment : 11/30  Nurse visit:  12/15  Lab order placed: Yes

## 2020-08-06 ENCOUNTER — Inpatient Hospital Stay: Payer: BC Managed Care – PPO | Attending: Internal Medicine | Admitting: Internal Medicine

## 2020-08-06 ENCOUNTER — Other Ambulatory Visit: Payer: Self-pay

## 2020-08-06 ENCOUNTER — Encounter: Payer: Self-pay | Admitting: Internal Medicine

## 2020-08-06 ENCOUNTER — Other Ambulatory Visit: Payer: Self-pay | Admitting: Medical Oncology

## 2020-08-06 ENCOUNTER — Inpatient Hospital Stay: Payer: BC Managed Care – PPO

## 2020-08-06 DIAGNOSIS — D563 Thalassemia minor: Secondary | ICD-10-CM | POA: Insufficient documentation

## 2020-08-06 DIAGNOSIS — E785 Hyperlipidemia, unspecified: Secondary | ICD-10-CM | POA: Insufficient documentation

## 2020-08-06 DIAGNOSIS — Z79899 Other long term (current) drug therapy: Secondary | ICD-10-CM | POA: Diagnosis not present

## 2020-08-06 DIAGNOSIS — D509 Iron deficiency anemia, unspecified: Secondary | ICD-10-CM | POA: Insufficient documentation

## 2020-08-06 DIAGNOSIS — I10 Essential (primary) hypertension: Secondary | ICD-10-CM | POA: Diagnosis not present

## 2020-08-06 DIAGNOSIS — Z8261 Family history of arthritis: Secondary | ICD-10-CM

## 2020-08-06 DIAGNOSIS — Z833 Family history of diabetes mellitus: Secondary | ICD-10-CM

## 2020-08-06 DIAGNOSIS — Z8249 Family history of ischemic heart disease and other diseases of the circulatory system: Secondary | ICD-10-CM | POA: Diagnosis not present

## 2020-08-06 DIAGNOSIS — Z90710 Acquired absence of both cervix and uterus: Secondary | ICD-10-CM | POA: Insufficient documentation

## 2020-08-06 DIAGNOSIS — D508 Other iron deficiency anemias: Secondary | ICD-10-CM

## 2020-08-06 DIAGNOSIS — Z8349 Family history of other endocrine, nutritional and metabolic diseases: Secondary | ICD-10-CM | POA: Diagnosis not present

## 2020-08-06 LAB — CMP (CANCER CENTER ONLY)
ALT: 17 U/L (ref 0–44)
AST: 21 U/L (ref 15–41)
Albumin: 3.9 g/dL (ref 3.5–5.0)
Alkaline Phosphatase: 75 U/L (ref 38–126)
Anion gap: 8 (ref 5–15)
BUN: 8 mg/dL (ref 8–23)
CO2: 28 mmol/L (ref 22–32)
Calcium: 9.8 mg/dL (ref 8.9–10.3)
Chloride: 100 mmol/L (ref 98–111)
Creatinine: 0.64 mg/dL (ref 0.44–1.00)
GFR, Estimated: >60 mL/min (ref >60)
Glucose, Bld: 91 mg/dL (ref 70–99)
Potassium: 3.2 mmol/L — ABNORMAL LOW (ref 3.5–5.1)
Sodium: 136 mmol/L (ref 135–145)
Total Bilirubin: 0.9 mg/dL (ref 0.3–1.2)
Total Protein: 6.9 g/dL (ref 6.5–8.1)

## 2020-08-06 LAB — CBC WITH DIFFERENTIAL (CANCER CENTER ONLY)
Abs Immature Granulocytes: 0.01 10*3/uL (ref 0.00–0.07)
Basophils Absolute: 0.1 10*3/uL (ref 0.0–0.1)
Basophils Relative: 1 %
Eosinophils Absolute: 0.2 10*3/uL (ref 0.0–0.5)
Eosinophils Relative: 3 %
HCT: 38.1 % (ref 36.0–46.0)
Hemoglobin: 11.9 g/dL — ABNORMAL LOW (ref 12.0–15.0)
Immature Granulocytes: 0 %
Lymphocytes Relative: 28 %
Lymphs Abs: 1.5 10*3/uL (ref 0.7–4.0)
MCH: 23.9 pg — ABNORMAL LOW (ref 26.0–34.0)
MCHC: 31.2 g/dL (ref 30.0–36.0)
MCV: 76.5 fL — ABNORMAL LOW (ref 80.0–100.0)
Monocytes Absolute: 0.4 10*3/uL (ref 0.1–1.0)
Monocytes Relative: 7 %
Neutro Abs: 3.4 10*3/uL (ref 1.7–7.7)
Neutrophils Relative %: 61 %
Platelet Count: 216 10*3/uL (ref 150–400)
RBC: 4.98 MIL/uL (ref 3.87–5.11)
RDW: 13.7 % (ref 11.5–15.5)
WBC Count: 5.5 10*3/uL (ref 4.0–10.5)
nRBC: 0 % (ref 0.0–0.2)

## 2020-08-06 LAB — FOLATE: Folate: 22.4 ng/mL (ref >5.9)

## 2020-08-06 LAB — FERRITIN: Ferritin: 209 ng/mL (ref 11–307)

## 2020-08-06 LAB — IRON AND TIBC
Iron: 112 ug/dL (ref 41–142)
Saturation Ratios: 41 % (ref 21–57)
TIBC: 276 ug/dL (ref 236–444)
UIBC: 164 ug/dL (ref 120–384)

## 2020-08-06 LAB — VITAMIN B12: Vitamin B-12: 507 pg/mL (ref 180–914)

## 2020-08-06 NOTE — Progress Notes (Signed)
[facility] [phone]   [phone]  CONSULT NOTE  REFERRING PHYSICIAN: Dr. Eliezer Lofts  REASON FOR CONSULTATION:  69 years old African-American female with persistent microcytic anemia.  HPI Michelle Gill is a 69 y.o. 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  The patient mentioned that she has a history of anemia for many years since age 89.  During her childbearing age she used to have heavy.  She had D&C 3 times as well as partial hysterectomy.  She was also seen by hematologist during her stay in [place] and was diagnosed with thalassemia minor.  The patient had a Cologuard performed on November 18 that was negative.  Her last colonoscopy was 11 years ago.  She denied having any other bleeding, bruises or ecchymosis.  She continues to have fatigue.  She was tested for thyroid disorder few years ago and this were reported to be normal.  She has occasional dizzy spells but she relates this mainly to her sinus problems.  The patient has occasional shortness of breath secondary to the nasal congestion.  She has no chest pain, cough or hemoptysis.  She denied having any nausea, vomiting, diarrhea or constipation.  She denied having any headache or visual changes. Family history significant for mother died at age 35 with end-stage renal disease, father had COPD and brother had prostate cancer. The patient is married and has 1 daughter.  She used to work for the [employer] as a Engineer, maintenance of operation. She has no history of smoking, alcohol or drug abuse.  HPI  Past Medical History:  Diagnosis Date  . Anal fissure   . Anemia   . Anxiety   . Asthma    exercise induced  . Hypertension   . Pneumonia 2006    Past Surgical History:  Procedure Laterality Date   . 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 SINUS IRRIGATION    . VAGINAL HYSTERECTOMY  1986   partial    Family History  Problem Relation Age of Onset  . Diabetes Brother   . Arthritis Brother   . Gout Brother   . Diabetes Sister        pre diabetic  . Arthritis Sister   . Gout Sister   . Alzheimer's disease Mother   . Hyperlipidemia Mother   . Hypertension Mother   . Arthritis Mother   . Gout Mother   . Congestive Heart Failure Mother   . Kidney disease Mother   . Alcohol abuse Father   . COPD Father   . Emphysema Father   . Alcohol abuse Brother   . Colon cancer Neg Hx     Social History Social History   Tobacco Use  . Smoking status: Never Smoker  . Smokeless tobacco: Never Used  Vaping Use  . Vaping Use: Never used  Substance Use Topics  . Alcohol use: No  . Drug use: No    Allergies  Allergen Reactions  . Almond (Diagnostic) Other (See Comments)    Tips of fingers turn black  . Morphine And Related     Felt like she was going to explode, heart raced  . Naproxen Swelling    REACTION: Finger tips swell and turn black!!!    Current Outpatient Medications  Medication Sig Dispense Refill  .  albuterol (VENTOLIN HFA) 108 (90 Base) MCG/ACT inhaler TAKE 2 PUFFS INTO THE LUNGS EVERY 4 HOURS AS NEEDED 18 g 2  . cetirizine-pseudoephedrine (ZYRTEC-D) 5-120 MG tablet Take 1 tablet by mouth daily as needed for allergies.     . D3-50 1.25 MG (50000 UT) capsule TAKE 1 CAPSULE BY MOUTH ONE TIME PER WEEK 12 capsule 0  . denosumab (PROLIA) 60 MG/ML SOSY injection Inject 60 mg into the skin every 6 (six) months. 1 mL 1  . esomeprazole (NEXIUM) 40 MG capsule Take 40 mg by mouth as needed. For acid reflux    . ferrous sulfate 324 (65 FE) MG TBEC Take 1 tablet by mouth daily.    Marland Kitchen lisinopril (ZESTRIL) 10 MG tablet Take 1 tablet (10 mg total) by mouth daily. 30 tablet 3  . omega-3 acid ethyl esters (LOVAZA) 1 g capsule Take by mouth daily.    Marland Kitchen triamcinolone (NASACORT) 55  MCG/ACT AERO nasal inhaler PLACE 2 SPRAYS IN EACH NOSTRIL ONCE A DAY FOR ALLERGIES (Patient taking differently: PLACE 2 SPRAYS IN EACH NOSTRIL ONCE A DAY FOR ALLERGIES as needed) 16.9 mL 5   No current facility-administered medications for this visit.    Review of Systems  Constitutional: positive for fatigue Eyes: negative Ears, nose, mouth, throat, and face: negative Respiratory: positive for dyspnea on exertion Cardiovascular: negative Gastrointestinal: negative Genitourinary:negative Integument/breast: negative Hematologic/lymphatic: negative Musculoskeletal:negative Neurological: negative Behavioral/Psych: negative Endocrine: negative Allergic/Immunologic: negative  Physical Exam  FOY:DXAJO, healthy, no distress, well nourished and well developed SKIN: skin color, texture, turgor are normal, no rashes or significant lesions HEAD: Normocephalic, No masses, lesions, tenderness or abnormalities EYES: normal, PERRLA, Conjunctiva are pink and non-injected EARS: External ears normal, Canals clear OROPHARYNX:no exudate, no erythema and lips, buccal mucosa, and tongue normal  NECK: supple, no adenopathy, no JVD LYMPH:  no palpable lymphadenopathy, no hepatosplenomegaly BREAST:not examined LUNGS: clear to auscultation , and palpation HEART: regular rate & rhythm, no murmurs and no gallops ABDOMEN:abdomen soft, non-tender, normal bowel sounds and no masses or organomegaly BACK: No CVA tenderness, Range of motion is normal EXTREMITIES:no joint deformities, effusion, or inflammation, no edema  NEURO: alert & oriented x 3 with fluent speech, no focal motor/sensory deficits  PERFORMANCE STATUS: ECOG 1  LABORATORY DATA: Lab Results  Component Value Date   WBC 5.5 08/06/2020   HGB 11.9 (L) 08/06/2020   HCT 38.1 08/06/2020   MCV 76.5 (L) 08/06/2020   PLT 216 08/06/2020      Chemistry      Component Value Date/Time   NA 136 08/06/2020 1401   K 3.2 (L) 08/06/2020 1401   CL  100 08/06/2020 1401   CO2 28 08/06/2020 1401   BUN 8 08/06/2020 1401   CREATININE 0.64 08/06/2020 1401   CREATININE 0.55 03/15/2015 1609      Component Value Date/Time   CALCIUM 9.8 08/06/2020 1401   ALKPHOS 75 08/06/2020 1401   AST 21 08/06/2020 1401   ALT 17 08/06/2020 1401   BILITOT 0.9 08/06/2020 1401       RADIOGRAPHIC STUDIES: No results found.  ASSESSMENT: This is a very pleasant 69 years old African-American female with very mild microcytic anemia likely secondary to her history of thalassemia minor.  This was also associated at times with iron deficiency but not recently.   PLAN: I had a lengthy discussion with the patient today about her current condition.  I order several studies for evaluation of her anemia including repeat CBC which showed hemoglobin of 11.9  with MCV of 76.5 consistent with her thalassemia minor. The iron study and ferritin were normal.  She also had normal vitamin B12 level and serum folate. I explained to the patient that the microcytic anemia is mainly because of her history of these thalassemia minor even in the absence of iron deficiency. I recommended for her to continue on observation with routine follow-up visit and evaluation by her primary care physician at this point. I will be happy to see her in the future if there is any other concerning hematologic disorders. The patient was advised to call if she has any concerning issues. The patient voices understanding of current disease status and treatment options and is in agreement with the current care plan.  All questions were answered. The patient knows to call the clinic with any problems, questions or concerns. We can certainly see the patient much sooner if necessary.  Thank you so much for allowing me to participate in the care of Zamariah Seaborn. I will continue to follow up the patient with you and assist in her care. The total time spent in the appointment was 60  minutes.  Disclaimer: This note was dictated with voice recognition software. Similar sounding words can inadvertently be transcribed and may not be corrected upon review.   Eilleen Kempf August 06, 2020, 2:56 PM

## 2020-08-07 MED ORDER — DENOSUMAB 60 MG/ML ~~LOC~~ SOSY: 60.0000 mg | PREFILLED_SYRINGE | SUBCUTANEOUS | 1 refills | Status: DC

## 2020-08-07 NOTE — Telephone Encounter (Signed)
Pt had labs at oncology yesterday and Ca was normal and CrCl was normal at 75.35 and normal. Pt can proceed with inj on 12/15. Pt is scheduled for inj on 12/15. PA in chart is only effective through 07/09/20.   Sent in new script so a new PA request would be sent to the office so it can be completed by the [place] apt.

## 2020-08-07 NOTE — Addendum Note (Signed)
Addended by: Randall An on: 08/07/2020 01:02 PM   Modules accepted: Orders

## 2020-08-09 DIAGNOSIS — K219 Gastro-esophageal reflux disease without esophagitis: Secondary | ICD-10-CM | POA: Diagnosis not present

## 2020-08-16 NOTE — Telephone Encounter (Signed)
No new PA is necessary. Pharmacy sent prolia back in Nov before PA expired and it is in the frig. Per Joycelyn Schmid pt will owe $25 . Ok for pt to proceed with inj. Do not charge pt for prolia.

## 2020-08-21 ENCOUNTER — Ambulatory Visit (INDEPENDENT_AMBULATORY_CARE_PROVIDER_SITE_OTHER): Payer: BC Managed Care – PPO

## 2020-08-21 ENCOUNTER — Other Ambulatory Visit: Payer: Self-pay

## 2020-08-21 DIAGNOSIS — M810 Age-related osteoporosis without current pathological fracture: Secondary | ICD-10-CM

## 2020-08-21 MED ORDER — DENOSUMAB 60 MG/ML ~~LOC~~ SOSY
60.0000 mg | PREFILLED_SYRINGE | Freq: Once | SUBCUTANEOUS | Status: AC
  Administered 2020-08-21: 60 mg via SUBCUTANEOUS

## 2020-08-21 NOTE — Progress Notes (Signed)
Per orders of Dr. Lorelei Pont in the absence of Dr. Diona Browner, injection of Prolia, given by Aneta Mins, RN. Patient tolerated injection well in R Arm.

## 2020-08-28 ENCOUNTER — Encounter: Payer: Self-pay | Admitting: Family Medicine

## 2020-09-04 NOTE — Assessment & Plan Note (Signed)
Chronic, stable on supplement 

## 2020-09-04 NOTE — Assessment & Plan Note (Addendum)
Due for repeat DEXA, chronic.  Continue current medication.   Prolia  60 mg every 6 months

## 2020-09-20 ENCOUNTER — Other Ambulatory Visit: Payer: Self-pay | Admitting: Family Medicine

## 2020-11-12 ENCOUNTER — Other Ambulatory Visit: Payer: Self-pay | Admitting: Family Medicine

## 2020-12-06 ENCOUNTER — Other Ambulatory Visit: Payer: Self-pay | Admitting: Family Medicine

## 2020-12-24 DIAGNOSIS — K219 Gastro-esophageal reflux disease without esophagitis: Secondary | ICD-10-CM | POA: Diagnosis not present

## 2020-12-24 DIAGNOSIS — J0140 Acute pansinusitis, unspecified: Secondary | ICD-10-CM | POA: Diagnosis not present

## 2021-01-22 ENCOUNTER — Telehealth: Payer: Self-pay

## 2021-01-22 DIAGNOSIS — M810 Age-related osteoporosis without current pathological fracture: Secondary | ICD-10-CM

## 2021-01-22 NOTE — Telephone Encounter (Signed)
Benefit verification submitted-pending Next injection due after 02/20/21

## 2021-01-28 ENCOUNTER — Telehealth: Payer: Self-pay | Admitting: *Deleted

## 2021-01-28 NOTE — Telephone Encounter (Signed)
Nickie with [facility] left a voicemail stating that they need a script sent to them for  Prolia 38ml/ml pre-filled syringe. Erick Blinks stated that you can fax the script to [phone].

## 2021-01-31 NOTE — Telephone Encounter (Signed)
Will be working on [employer] and ordering next week. Need to verify with patient if she is using this company. Do not see this in the previous injection notes.

## 2021-02-05 NOTE — Telephone Encounter (Signed)
Benefits received.  OOP cost is $80, needs PA- auth approved # [id number]. Dates: 02/20/21-02/19/22  Left message for patient to call back  Also received a voicemail from specialty pharmacy requesting RX for Prolia to be sent to them. I do not see any documentation from past notes that patient used this pharmacy and I need to verify this information with the patient.  "Nickie with [facility] left a voicemail stating that they need a script sent to them for  Prolia 57ml/ml pre-filled syringe. Erick Blinks stated that you can fax the script to [phone]. " Per notes it shows to be sent before to [facility] and patient was getting co pay card assistance.  Will verify with patient once I get in touch with her.

## 2021-02-05 NOTE — Telephone Encounter (Signed)
See other phone note about this.

## 2021-02-18 ENCOUNTER — Telehealth (INDEPENDENT_AMBULATORY_CARE_PROVIDER_SITE_OTHER): Payer: BC Managed Care – PPO | Admitting: Family Medicine

## 2021-02-18 ENCOUNTER — Telehealth: Payer: Self-pay | Admitting: Family Medicine

## 2021-02-18 ENCOUNTER — Encounter: Payer: Self-pay | Admitting: Family Medicine

## 2021-02-18 VITALS — Temp 98.7°F | Ht 62.5 in

## 2021-02-18 DIAGNOSIS — U071 COVID-19: Secondary | ICD-10-CM

## 2021-02-18 MED ORDER — NIRMATRELVIR/RITONAVIR (PAXLOVID)TABLET: 3.0000 | ORAL_TABLET | Freq: Two times a day (BID) | ORAL | 0 refills | Status: AC

## 2021-02-18 NOTE — Progress Notes (Signed)
VIRTUAL VISIT Due to national recommendations of social distancing due to [place] 19, a virtual visit is felt to be most appropriate for this patient at this time.   I connected with the patient on 02/18/21 at  4:20 PM EDT by virtual telehealth platform and verified that I am speaking with the correct person using two identifiers.   I discussed the limitations, risks, security and privacy concerns of performing an evaluation and management service by  virtual telehealth platform and the availability of in person appointments. I also discussed with the patient that there may be a patient responsible charge related to this service. The patient expressed understanding and agreed to proceed.  Patient location: Home Provider Location: [facility] [place] Participants: Eliezer Lofts and Bella Kennedy   Chief Complaint  Patient presents with   Covid Positive    History of Present Illness: 70 year old female patient with history of HTN and exercise induced asthma presents with new onset COVID infeciton.  Date of Onset: Started with ST and diarrhea 02/05/2021 She had resolution of ST, diarrhea had resolved.  Started with cough 5 days ago... dry. Occ waking her at night.  NO SOB, no wheezing.  Has not needed albuterol prn.   Temperature 101.6 F  on 6/10 and 6/11.   Less temperature since.  No myalgia, has fatigue.    Has been using tylenol, aleve.   GFR: >60  COVID 19 screen COVID testing: 02/16/2021 COVID vaccine: 06/13/2020 , 11/02/2019 , 10/08/2019 COVID exposure:  Exposed on trip to [place]  The importance of social distancing was discussed today.    Review of Systems  All other systems reviewed and are negative.    Past Medical History:  Diagnosis Date   Anal fissure    Anemia    Anxiety    Asthma    exercise induced   Hypertension    Pneumonia 2006    reports that she has never smoked. She has never used smokeless tobacco. She reports that she does not drink alcohol and  does not use drugs.   Current Outpatient Medications:    albuterol (VENTOLIN HFA) 108 (90 Base) MCG/ACT inhaler, INHALE 2 PUFFS BY MOUTH EVERY 4 HOURS AS NEEDED, Disp: 18 each, Rfl: 2   cetirizine-pseudoephedrine (ZYRTEC-D) 5-120 MG tablet, Take 1 tablet by mouth daily as needed for allergies. , Disp: , Rfl:    D3-50 1.25 MG (50000 UT) capsule, TAKE 1 CAPSULE BY MOUTH ONE TIME PER WEEK, Disp: 12 capsule, Rfl: 0   denosumab (PROLIA) 60 MG/ML SOSY injection, Inject 60 mg into the skin every 6 (six) months., Disp: 1 mL, Rfl: 1   esomeprazole (NEXIUM) 40 MG capsule, Take 40 mg by mouth as needed. For acid reflux, Disp: , Rfl:    ferrous sulfate 324 (65 FE) MG TBEC, Take 1 tablet by mouth daily., Disp: , Rfl:    lisinopril (ZESTRIL) 10 MG tablet, TAKE 1 TABLET BY MOUTH EVERY DAY, Disp: 90 tablet, Rfl: 1   omega-3 acid ethyl esters (LOVAZA) 1 g capsule, Take by mouth daily., Disp: , Rfl:    triamcinolone (NASACORT) 55 MCG/ACT AERO nasal inhaler, PLACE 2 SPRAYS IN EACH NOSTRIL ONCE A DAY FOR ALLERGIES (Patient taking differently: PLACE 2 SPRAYS IN EACH NOSTRIL ONCE A DAY FOR ALLERGIES as needed), Disp: 16.9 mL, Rfl: 5   Observations/Objective: Temperature 98.7 F (37.1 C), temperature source Oral, height 5' 2.5" (1.588 m).  Physical Exam  Physical Exam Constitutional:      General: The  patient is not in acute distress. Pulmonary:     Effort: Pulmonary effort is normal. No respiratory distress.  Neurological:     Mental Status: The patient is alert and oriented to person, place, and time.  Psychiatric:        Mood and Affect: Mood normal.        Behavior: Behavior normal.   Assessment and Plan Problem List Items Addressed This Visit     COVID-19 - Primary    COVID19  Infection < 5 days from onset of symptoms in quadruple vaccinated overweight individual with history of asthma  No clear sign of bacterial infection at this time.   No SOB.  No red flags/need for ER visit or in-person exam  at respiratory clinic at this time..    Pt higher risk for COVID complications given  asthma, age. GFR  >60 and no medication contraindications.  Start paxlovid 5 day course. Reviewed course of medication and side effect profile with patient in detail.   Symptomatic care with mucinex and cough suppressant at night. If SOB begins symptoms worsening.. have low threshold for in-person exam, if severe shortness of breath ER visit recommended.  Can monitor Oxygen saturation at home with home monitor if able to obtain.  Go to ER if O2 sat < 90% on room air.   Reviewed home care and provided information through [place].  Recommended quarantine 5 days isolation recommended. Return to work day 6 and wear mask for 4 more days to complete 10 days. Provided info about prevention of spread of COVID 19.          I discussed the assessment and treatment plan with the patient. The patient was provided an opportunity to ask questions and all were answered. The patient agreed with the plan and demonstrated an understanding of the instructions.   The patient was advised to call back or seek an in-person evaluation if the symptoms worsen or if the condition fails to improve as anticipated.     Eliezer Lofts, MD

## 2021-02-18 NOTE — Telephone Encounter (Signed)
Had appt with this patient today. She has dental implant surgery scheduled at the end of June. She will wait until end of 05/2021 to do next Prolia injection. She has let the specialty pharmacy know as well.  She will contact our office when she is ready to set it up.

## 2021-02-20 NOTE — Telephone Encounter (Signed)
Noted and Prolia book updated.

## 2021-03-18 ENCOUNTER — Ambulatory Visit: Payer: BC Managed Care – PPO | Admitting: Family Medicine

## 2021-03-18 VITALS — BP 100/72 | HR 92 | Temp 97.6°F | Ht 62.5 in | Wt 127.5 lb

## 2021-03-18 DIAGNOSIS — R053 Chronic cough: Secondary | ICD-10-CM

## 2021-03-18 DIAGNOSIS — K219 Gastro-esophageal reflux disease without esophagitis: Secondary | ICD-10-CM

## 2021-03-18 DIAGNOSIS — R1312 Dysphagia, oropharyngeal phase: Secondary | ICD-10-CM | POA: Insufficient documentation

## 2021-03-18 DIAGNOSIS — U099 Post covid-19 condition, unspecified: Secondary | ICD-10-CM | POA: Insufficient documentation

## 2021-03-18 NOTE — Assessment & Plan Note (Signed)
Avoid triggers.  Increase PPI temporarily to twice daily... if symptoms not improving refer to GI.

## 2021-03-18 NOTE — Patient Instructions (Addendum)
Avoid reflux triggers. Can temporarily increase Nexium to 40 mg TWICE daily for 1-2 weeks... but if difficulty swallowing not improving... make appt with GI for likely endoscopy.  Expect cough to gradually improve over next 2 months.

## 2021-03-18 NOTE — Assessment & Plan Note (Addendum)
Likely due GER Avoid GER triggers... reviewed.  Increase PPI temporarily to twice daily... if symptoms not improving refer to GI.

## 2021-03-18 NOTE — Progress Notes (Signed)
Patient ID: Michelle Gill, female    DOB: [birth year], 70 y.o.   MRN: [id number]  This visit was conducted in person.  BP 100/72   Pulse 92   Temp 97.6 F (36.4 C) (Temporal)   Ht 5' 2.5" (1.588 m)   Wt 127 lb 8 oz (57.8 kg)   SpO2 98%   BMI 22.95 kg/m    CC:  Chief Complaint  Patient presents with   Cough    Lingering cough since Covid   Sore Throat    Burning    Subjective:   HPI: Michelle Gill is a 70 y.o. female presenting on 03/18/2021 for Cough (Lingering cough since Covid) and Sore Throat (Burning)   Dx with COVID  on  6/14.. OV reviewed. Treated with antivirals Paxlovid. She reports lingering cough. Deep but dry cough. She feels better overall.  NO SOB, she feel tired. Able to mow the lawn. No fever, no body ache. No CP, no palpitations.  She has also noted burning in her throat started during [place] feels different than reflux.  Today is the first day it has improved/ almost resolved.   On Nexium 40 mg daily Reviewed OV note form ENT 12/2020   Drinks soda 3-4 Cokes per day on past but has recently been trying hard to decrease.   She has noted pills getting cought I throat.  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  Relevant past medical, surgical, family and social history reviewed and updated as indicated. Interim medical history since our last visit reviewed. Allergies and medications reviewed and updated. Outpatient Medications Prior to Visit  Medication Sig Dispense Refill   albuterol (VENTOLIN HFA) 108 (90 Base) MCG/ACT inhaler INHALE 2 PUFFS BY MOUTH EVERY 4 HOURS AS NEEDED 18 each 2   cetirizine-pseudoephedrine (ZYRTEC-D) 5-120 MG tablet Take 1 tablet by mouth daily as needed for allergies.      D3-50 1.25 MG (50000 UT) capsule TAKE 1 CAPSULE BY MOUTH ONE TIME PER WEEK 12 capsule 0   denosumab (PROLIA) 60 MG/ML SOSY injection Inject 60 mg into the skin every 6 (six) months. 1 mL 1   esomeprazole (NEXIUM) 40 MG  capsule Take 40 mg by mouth as needed. For acid reflux     ferrous sulfate 324 (65 FE) MG TBEC Take 1 tablet by mouth daily.     hydrochlorothiazide (HYDRODIURIL) 25 MG tablet Take 25 mg by mouth daily.     lisinopril (ZESTRIL) 10 MG tablet TAKE 1 TABLET BY MOUTH EVERY DAY 90 tablet 1   omega-3 acid ethyl esters (LOVAZA) 1 g capsule Take by mouth daily.     triamcinolone (NASACORT) 55 MCG/ACT AERO nasal inhaler PLACE 2 SPRAYS IN EACH NOSTRIL ONCE A DAY FOR ALLERGIES 16.9 mL 5   No facility-administered medications prior to visit.     Per HPI unless specifically indicated in ROS section below Review of Systems  Constitutional:  Negative for fatigue and fever.  HENT:  Negative for congestion.   Eyes:  Negative for pain.  Respiratory:  Negative for cough and shortness of breath.   Cardiovascular:  Negative for chest pain, palpitations and leg swelling.  Gastrointestinal:  Negative for abdominal pain.  Genitourinary:  Negative for dysuria and vaginal bleeding.  Musculoskeletal:  Negative for back pain.  Neurological:  Negative for syncope, light-headedness and headaches.  Psychiatric/Behavioral:  Negative for dysphoric mood.   Objective:  BP 100/72   Pulse 92   Temp 97.6 F (36.4 C) (Temporal)  Ht 5' 2.5" (1.588 m)   Wt 127 lb 8 oz (57.8 kg)   SpO2 98%   BMI 22.95 kg/m   Wt Readings from Last 3 Encounters:  03/18/21 127 lb 8 oz (57.8 kg)  08/06/20 130 lb 9.6 oz (59.2 kg)  06/28/20 128 lb 4 oz (58.2 kg)      Physical Exam Constitutional:      General: She is not in acute distress.    Appearance: Normal appearance. She is well-developed. She is not ill-appearing or toxic-appearing.  HENT:     Head: Normocephalic.     Right Ear: Hearing, tympanic membrane, ear canal and external ear normal. Tympanic membrane is not erythematous, retracted or bulging.     Left Ear: Hearing, tympanic membrane, ear canal and external ear normal. Tympanic membrane is not erythematous, retracted or  bulging.     Nose: No mucosal edema or rhinorrhea.     Right Sinus: No maxillary sinus tenderness or frontal sinus tenderness.     Left Sinus: No maxillary sinus tenderness or frontal sinus tenderness.     Mouth/Throat:     Pharynx: Uvula midline.  Eyes:     General: Lids are normal. Lids are everted, no foreign bodies appreciated.     Conjunctiva/sclera: Conjunctivae normal.     Pupils: Pupils are equal, round, and reactive to light.  Neck:     Thyroid: No thyroid mass or thyromegaly.     Vascular: No carotid bruit.     Trachea: Trachea normal.  Cardiovascular:     Rate and Rhythm: Normal rate and regular rhythm.     Pulses: Normal pulses.     Heart sounds: Normal heart sounds, S1 normal and S2 normal. No murmur heard.   No friction rub. No gallop.  Pulmonary:     Effort: Pulmonary effort is normal. No tachypnea or respiratory distress.     Breath sounds: Normal breath sounds. No decreased breath sounds, wheezing, rhonchi or rales.  Abdominal:     General: Bowel sounds are normal.     Palpations: Abdomen is soft.     Tenderness: There is no abdominal tenderness.  Musculoskeletal:     Cervical back: Normal range of motion and neck supple.  Skin:    General: Skin is warm and dry.     Findings: No rash.  Neurological:     Mental Status: She is alert.  Psychiatric:        Mood and Affect: Mood is not anxious or depressed.        Speech: Speech normal.        Behavior: Behavior normal. Behavior is cooperative.        Thought Content: Thought content normal.        Judgment: Judgment normal.      Results for orders placed or performed in visit on 08/28/20  HM DEXA SCAN  Result Value Ref Range   HM Dexa Scan Osteoporosis     This visit occurred during the SARS-CoV-2 public health emergency.  Safety protocols were in place, including screening questions prior to the visit, additional usage of staff PPE, and extensive cleaning of exam room while observing appropriate contact  time as indicated for disinfecting solutions.   COVID 19 screen:  No recent travel or known exposure to COVID19 The patient denies respiratory symptoms of COVID 19 at this time. The importance of social distancing was discussed today.   Assessment and Plan    Problem List Items Addressed This Visit  Gastroesophageal reflux disease    Avoid triggers.  Increase PPI temporarily to twice daily... if symptoms not improving refer to GI.       Oropharyngeal dysphagia    Likely due GER Avoid triggers.  Increase PPI temporarily to twice daily... if symptoms not improving refer to GI.       Post-COVID chronic cough - Primary    Lung exam clear, no red flags, gradually improving.   Likely post infectious cough/bronchospasm. Symptomatic care. No  indication for prednisone or albuterol.         Eliezer Lofts, MD

## 2021-03-18 NOTE — Assessment & Plan Note (Signed)
Lung exam clear, no red flags, gradually improving.   Likely post infectious cough/bronchospasm. Symptomatic care. No  indication for prednisone or albuterol.

## 2021-03-20 ENCOUNTER — Other Ambulatory Visit: Payer: Self-pay | Admitting: Family Medicine

## 2021-03-27 ENCOUNTER — Ambulatory Visit: Payer: BC Managed Care – PPO | Admitting: Family Medicine

## 2021-03-27 ENCOUNTER — Encounter: Payer: Self-pay | Admitting: Family Medicine

## 2021-03-27 ENCOUNTER — Other Ambulatory Visit: Payer: Self-pay

## 2021-03-27 VITALS — BP 120/72 | HR 91 | Temp 98.0°F | Ht 62.5 in

## 2021-03-27 DIAGNOSIS — W57XXXA Bitten or stung by nonvenomous insect and other nonvenomous arthropods, initial encounter: Secondary | ICD-10-CM

## 2021-03-27 DIAGNOSIS — S80861A Insect bite (nonvenomous), right lower leg, initial encounter: Secondary | ICD-10-CM | POA: Diagnosis not present

## 2021-03-27 NOTE — Progress Notes (Signed)
Patient ID: Michelle Gill, female    DOB: [birth year], 70 y.o.   MRN: [id number]  This visit was conducted in person.  BP 120/72   Pulse 91   Temp 98 F (36.7 C) (Temporal)   Ht 5' 2.5" (1.588 m)   SpO2 99%   BMI 22.95 kg/m    CC: Chief Complaint  Patient presents with   Insect Bite    2 tick bites on right leg    Subjective:   HPI: Michelle Gill is a 70 y.o. female presenting on 03/27/2021 for Insect Bite (2 tick bites on right leg)  She report 2 days ago she noted 2 tick on right calf.  She feels ticks may have gotten on her when walking through yard.. were likely attached for few hours. Black, no markings.  She has two purple lesions where they were irritated.. no surrounding rash.  Slight initital swelling better now.  No itchy.   Feels well otherwise. No fever, neck pain, no joint pain.     Relevant past medical, surgical, family and social history reviewed and updated as indicated. Interim medical history since our last visit reviewed. Allergies and medications reviewed and updated. Outpatient Medications Prior to Visit  Medication Sig Dispense Refill   albuterol (VENTOLIN HFA) 108 (90 Base) MCG/ACT inhaler INHALE 2 PUFFS BY MOUTH EVERY 4 HOURS AS NEEDED 18 each 2   cetirizine-pseudoephedrine (ZYRTEC-D) 5-120 MG tablet Take 1 tablet by mouth daily as needed for allergies.      D3-50 1.25 MG (50000 UT) capsule TAKE 1 CAPSULE BY MOUTH ONE TIME PER WEEK 12 capsule 0   denosumab (PROLIA) 60 MG/ML SOSY injection Inject 60 mg into the skin every 6 (six) months. 1 mL 1   esomeprazole (NEXIUM) 40 MG capsule Take 40 mg by mouth as needed. For acid reflux     ferrous sulfate 324 (65 FE) MG TBEC Take 1 tablet by mouth daily.     hydrochlorothiazide (HYDRODIURIL) 25 MG tablet Take 25 mg by mouth daily.     lisinopril (ZESTRIL) 10 MG tablet TAKE 1 TABLET BY MOUTH EVERY DAY 90 tablet 0   omega-3 acid ethyl esters (LOVAZA) 1 g capsule Take by mouth daily.      triamcinolone (NASACORT) 55 MCG/ACT AERO nasal inhaler PLACE 2 SPRAYS IN EACH NOSTRIL ONCE A DAY FOR ALLERGIES 16.9 mL 5   No facility-administered medications prior to visit.     Per HPI unless specifically indicated in ROS section below Review of Systems  Constitutional:  Negative for fatigue and fever.  HENT:  Negative for ear pain.   Eyes:  Negative for pain.  Respiratory:  Negative for chest tightness and shortness of breath.   Cardiovascular:  Negative for chest pain, palpitations and leg swelling.  Gastrointestinal:  Negative for abdominal pain.  Genitourinary:  Negative for dysuria.  Objective:  BP 120/72   Pulse 91   Temp 98 F (36.7 C) (Temporal)   Ht 5' 2.5" (1.588 m)   SpO2 99%   BMI 22.95 kg/m   Wt Readings from Last 3 Encounters:  03/18/21 127 lb 8 oz (57.8 kg)  08/06/20 130 lb 9.6 oz (59.2 kg)  06/28/20 128 lb 4 oz (58.2 kg)      Physical Exam Constitutional:      General: She is not in acute distress.    Appearance: Normal appearance. She is well-developed. She is not ill-appearing or toxic-appearing.  HENT:     Head: Normocephalic.  Right Ear: Hearing, tympanic membrane, ear canal and external ear normal. Tympanic membrane is not erythematous, retracted or bulging.     Left Ear: Hearing, tympanic membrane, ear canal and external ear normal. Tympanic membrane is not erythematous, retracted or bulging.     Nose: No mucosal edema or rhinorrhea.     Right Sinus: No maxillary sinus tenderness or frontal sinus tenderness.     Left Sinus: No maxillary sinus tenderness or frontal sinus tenderness.     Mouth/Throat:     Pharynx: Uvula midline.  Eyes:     General: Lids are normal. Lids are everted, no foreign bodies appreciated.     Conjunctiva/sclera: Conjunctivae normal.     Pupils: Pupils are equal, round, and reactive to light.  Neck:     Thyroid: No thyroid mass or thyromegaly.     Vascular: No carotid bruit.     Trachea: Trachea normal.   Cardiovascular:     Rate and Rhythm: Normal rate and regular rhythm.     Pulses: Normal pulses.     Heart sounds: Normal heart sounds, S1 normal and S2 normal. No murmur heard.   No friction rub. No gallop.  Pulmonary:     Effort: Pulmonary effort is normal. No tachypnea or respiratory distress.     Breath sounds: Normal breath sounds. No decreased breath sounds, wheezing, rhonchi or rales.  Abdominal:     General: Bowel sounds are normal.     Palpations: Abdomen is soft.     Tenderness: There is no abdominal tenderness.  Musculoskeletal:     Cervical back: Normal range of motion and neck supple.  Skin:    General: Skin is warm and dry.     Findings: No rash.     Comments: 2 < 1 cm nodules on right lower leg, purplish in color with no surrounding erythema, no target lesion  Neurological:     Mental Status: She is alert.  Psychiatric:        Mood and Affect: Mood is not anxious or depressed.        Speech: Speech normal.        Behavior: Behavior normal. Behavior is cooperative.        Thought Content: Thought content normal.        Judgment: Judgment normal.      Results for orders placed or performed in visit on 08/28/20  HM DEXA SCAN  Result Value Ref Range   HM Dexa Scan Osteoporosis     This visit occurred during the SARS-CoV-2 public health emergency.  Safety protocols were in place, including screening questions prior to the visit, additional usage of staff PPE, and extensive cleaning of exam room while observing appropriate contact time as indicated for disinfecting solutions.   COVID 19 screen:  No recent travel or known exposure to COVID19 The patient denies respiratory symptoms of COVID 19 at this time. The importance of social distancing was discussed today.   Assessment and Plan Problem List Items Addressed This Visit     Tick bite of right lower leg - Primary    No red flags for tick born illness, likely attached < 24 hours.  Reviewed return precautions in  detail.  Apply topical hydrocortisone cream for itching.  Call if any tick borne illness symptoms.          Eliezer Lofts, MD

## 2021-03-27 NOTE — Patient Instructions (Signed)
Apply topical hydrocortisone cream for itching.  Call if any tick borne illness symptoms.

## 2021-04-30 DIAGNOSIS — U071 COVID-19: Secondary | ICD-10-CM | POA: Insufficient documentation

## 2021-04-30 NOTE — Assessment & Plan Note (Signed)
COVID19  Infection < 5 days from onset of symptoms in quadruple vaccinated overweight individual with history of asthma  No clear sign of bacterial infection at this time.   No SOB.  No red flags/need for ER visit or in-person exam at respiratory clinic at this time..    Pt higher risk for COVID complications given  asthma, age. GFR  >60 and no medication contraindications.  Start paxlovid 5 day course. Reviewed course of medication and side effect profile with patient in detail.   Symptomatic care with mucinex and cough suppressant at night. If SOB begins symptoms worsening.. have low threshold for in-person exam, if severe shortness of breath ER visit recommended.  Can monitor Oxygen saturation at home with home monitor if able to obtain.  Go to ER if O2 sat < 90% on room air.   Reviewed home care and provided information through [place].  Recommended quarantine 5 days isolation recommended. Return to work day 6 and wear mask for 4 more days to complete 10 days. Provided info about prevention of spread of COVID 19.

## 2021-05-21 NOTE — Assessment & Plan Note (Signed)
No red flags for tick born illness, likely attached < 24 hours.  Reviewed return precautions in detail.  Apply topical hydrocortisone cream for itching.  Call if any tick borne illness symptoms.

## 2021-05-27 MED ORDER — DENOSUMAB 60 MG/ML ~~LOC~~ SOSY: 60.0000 mg | PREFILLED_SYRINGE | SUBCUTANEOUS | 0 refills | Status: DC

## 2021-05-27 NOTE — Addendum Note (Signed)
Addended by: Kris Mouton on: 05/27/2021 01:27 PM   Modules accepted: Orders

## 2021-05-27 NOTE — Telephone Encounter (Signed)
Spoke with patient, she ended up not getting dental procedure done. Discussed Prolia. Benefits submitted-checked. RX sent to specialty pharmacy to be mailed to [place] station office. Lab appointment scheduled with [place] office for 05/04/21. Nurse Visit at [facility] station on 06/10/2021.  Started PA for Prolia. Pending insurance decision

## 2021-05-28 NOTE — Telephone Encounter (Signed)
PA for Prolia approved 05/27/21-05/27/22

## 2021-06-02 DIAGNOSIS — Z1231 Encounter for screening mammogram for malignant neoplasm of breast: Secondary | ICD-10-CM | POA: Diagnosis not present

## 2021-06-02 LAB — HM MAMMOGRAPHY

## 2021-06-03 ENCOUNTER — Encounter: Payer: Self-pay | Admitting: Family Medicine

## 2021-06-04 ENCOUNTER — Other Ambulatory Visit: Payer: Self-pay

## 2021-06-04 ENCOUNTER — Other Ambulatory Visit (INDEPENDENT_AMBULATORY_CARE_PROVIDER_SITE_OTHER): Payer: BC Managed Care – PPO

## 2021-06-04 DIAGNOSIS — M810 Age-related osteoporosis without current pathological fracture: Secondary | ICD-10-CM | POA: Diagnosis not present

## 2021-06-04 LAB — BASIC METABOLIC PANEL
BUN: 8 mg/dL (ref 6–23)
CO2: 31 mEq/L (ref 19–32)
Calcium: 10.1 mg/dL (ref 8.4–10.5)
Chloride: 101 mEq/L (ref 96–112)
Creatinine, Ser: 0.64 mg/dL (ref 0.40–1.20)
GFR: 88.46 mL/min (ref >60.00)
Glucose, Bld: 91 mg/dL (ref 70–99)
Potassium: 3.4 mEq/L — ABNORMAL LOW (ref 3.5–5.1)
Sodium: 139 mEq/L (ref 135–145)

## 2021-06-06 NOTE — Telephone Encounter (Signed)
Called [facility] mail order pharmacy to check on status of Prolia RX been shipped. Was advised that it is not shipped yet, they were needing to get in touch with patient to let her know it will be $90 OOP cost but she is eligible for copay card assistance. They need to hear from patient directly before proceeding. I was advised they did try to call patient about this they think but were not able to speak with patient. I called patient and left detailed message asking her to call them back and to call me back after speaking with them. May need to post pone nurse visit scheduled for 06/10/21.  CB to them [phone]

## 2021-06-06 NOTE — Telephone Encounter (Signed)
CrCl 72.5 mL/min. Calcium was normal on 06/04/21 10.1

## 2021-06-06 NOTE — Progress Notes (Signed)
Noted  

## 2021-06-09 NOTE — Telephone Encounter (Signed)
Spoke with patient. Patient has spoken with [facility] pharmacy and Prolia should be shipped to [place] now. Rescheduled for 06/18/21 to make sure we receive Prolia shipment.

## 2021-06-10 ENCOUNTER — Ambulatory Visit: Payer: BC Managed Care – PPO

## 2021-06-18 NOTE — Telephone Encounter (Signed)
Prolia was received today. Patient advised to keep her nurse visit appointment on 06/19/21

## 2021-06-19 ENCOUNTER — Ambulatory Visit (INDEPENDENT_AMBULATORY_CARE_PROVIDER_SITE_OTHER): Payer: BC Managed Care – PPO

## 2021-06-19 ENCOUNTER — Other Ambulatory Visit: Payer: Self-pay

## 2021-06-19 DIAGNOSIS — M810 Age-related osteoporosis without current pathological fracture: Secondary | ICD-10-CM

## 2021-06-19 MED ORDER — DENOSUMAB 60 MG/ML ~~LOC~~ SOSY
60.0000 mg | PREFILLED_SYRINGE | Freq: Once | SUBCUTANEOUS | Status: AC
Start: 2021-06-19 — End: 2021-06-19
  Administered 2021-06-19: 60 mg via SUBCUTANEOUS

## 2021-06-19 NOTE — Progress Notes (Signed)
Per orders of Dr. Diona Browner, injection of Prolia given by Pilar Grammes, CMA in Right Arm. Patient tolerated injection well.

## 2021-06-24 ENCOUNTER — Other Ambulatory Visit: Payer: Self-pay | Admitting: Family Medicine

## 2021-06-24 NOTE — Telephone Encounter (Signed)
Left voice message to call the office  

## 2021-06-24 NOTE — Telephone Encounter (Signed)
Please schedule CPE with Dr. Diona Browner.

## 2021-06-25 NOTE — Telephone Encounter (Signed)
Pt scheduled cpe/lab on 11.4.22

## 2021-07-08 IMAGING — MR MR CERVICAL SPINE W/O CM
4 of 5 series · 24 of 48 positions shown · non-contrast
Comparison: Cervical spine MRI 10/08/2010

CLINICAL DATA: Neck pain. Additional history provided: Neck pain
radiating into left arm and left hand with tingling for 2 years,
neck pain and stiffness.

EXAM:
MRI CERVICAL SPINE WITHOUT CONTRAST
TECHNIQUE: Multiplanar, multisequence MR imaging of the cervical spine was
performed. No intravenous contrast was administered.

[Series 3: T2 post-contrast · sagittal · 3.3mm · 0.37mm/px · 8 of 17 slices shown]
[im 1/17]
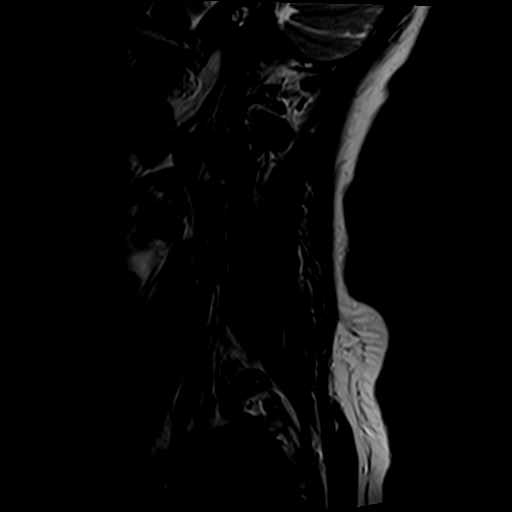
[im 3/17]
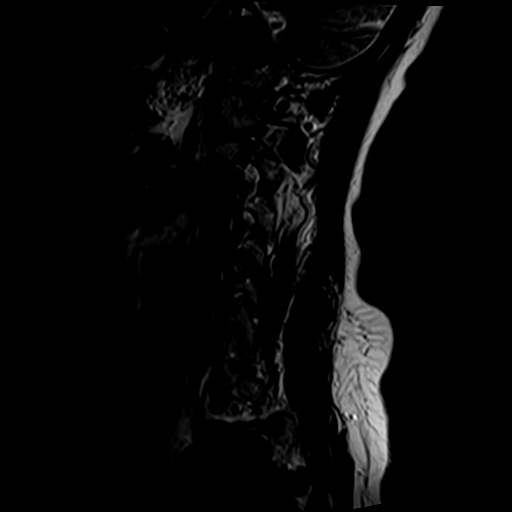
[im 5/17]
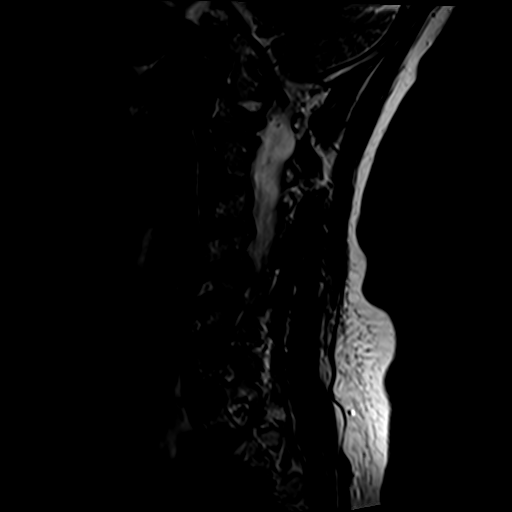
[im 7/17]
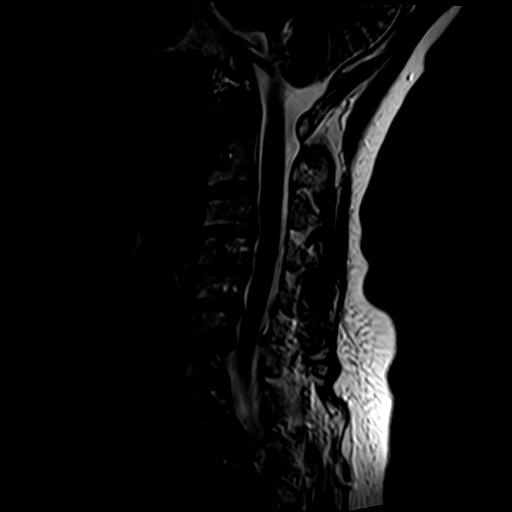
[im 10/17]
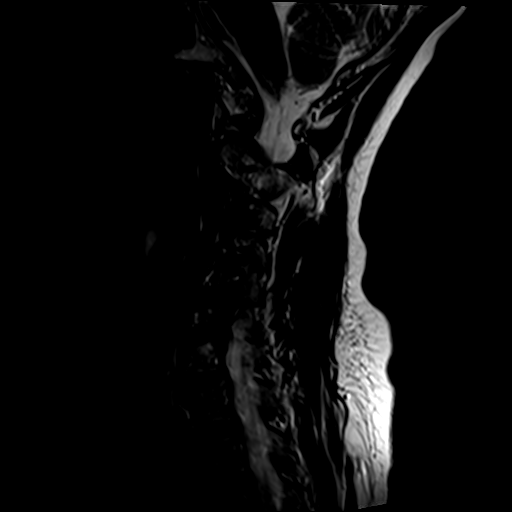
[im 12/17]
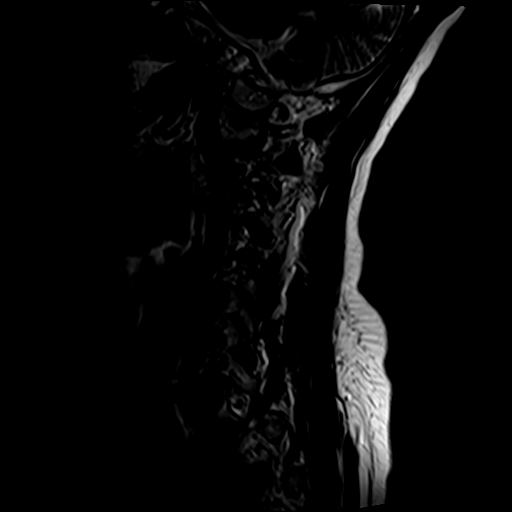
[im 14/17]
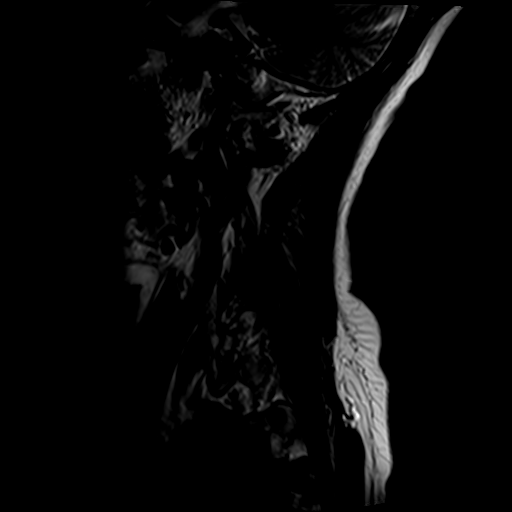
[im 17/17]
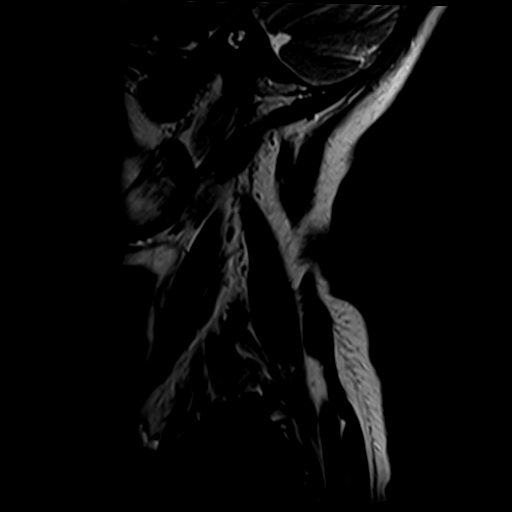

[Series 4: T1 · sagittal · 3.3mm · 0.37mm/px · 3 of 17 slices shown]
[im 3/17]
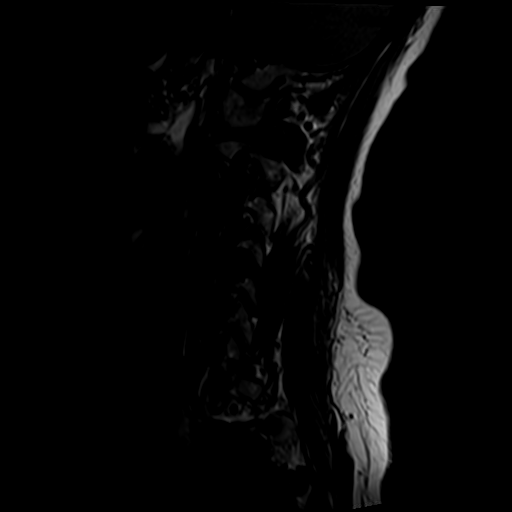
[im 10/17]
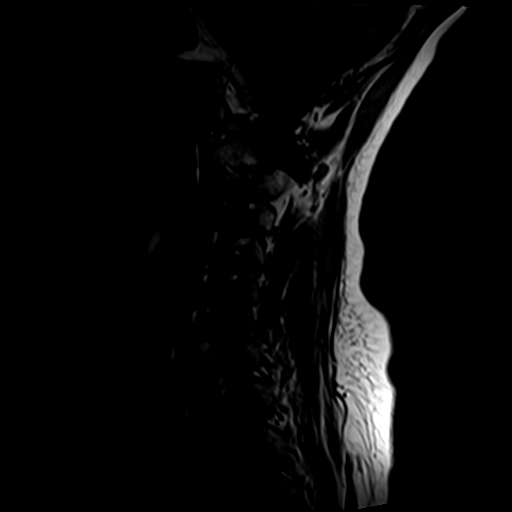
[im 14/17]
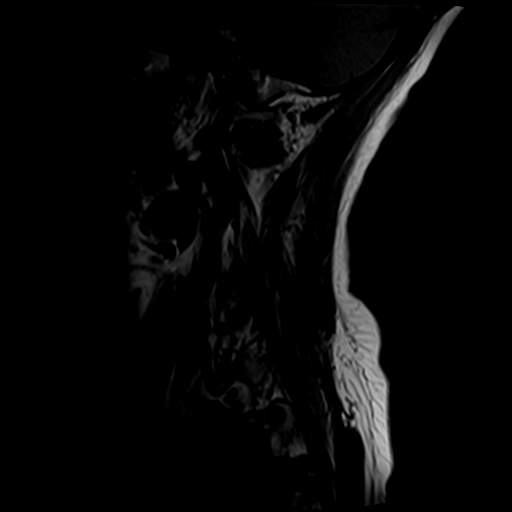

[Series 5: tir sag · sagittal · 3.3mm · 0.37mm/px · 3 of 17 slices shown]
[im 3/17]
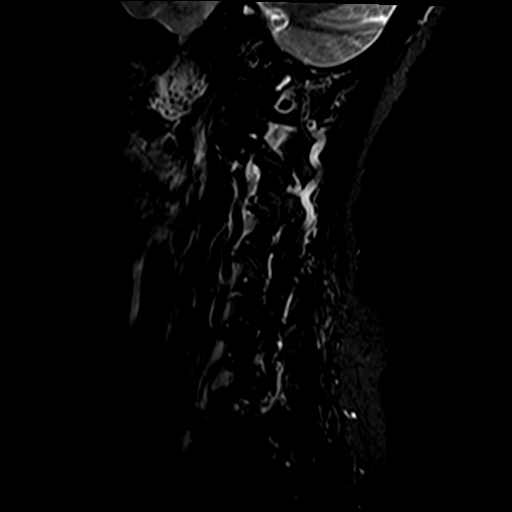
[im 10/17]
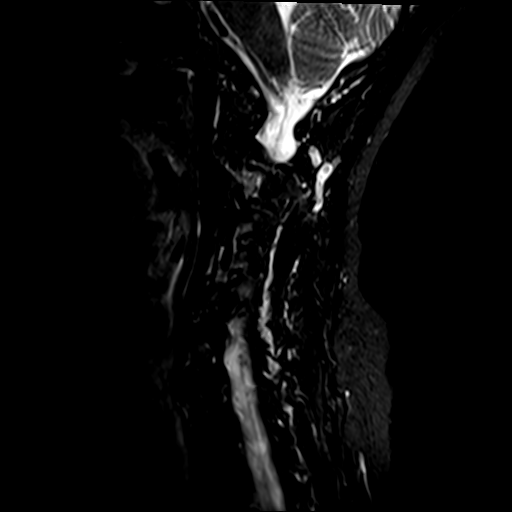
[im 14/17]
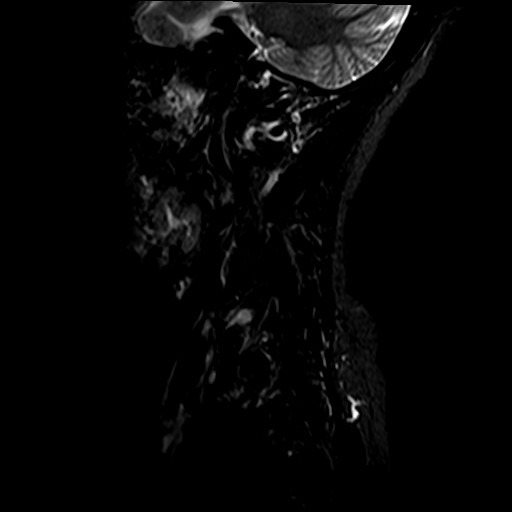

[Series 7: T2 · axial · 3.0mm · 0.70mm/px · z∈[-142,-29]mm · 10 of 33 slices shown]
[im 3/33]
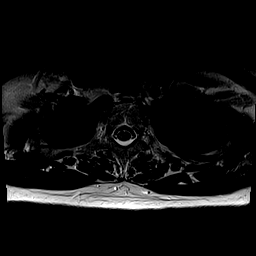
[im 5/33]
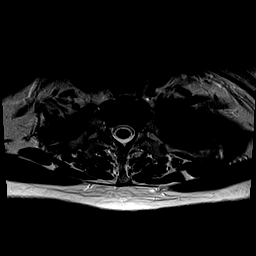
[im 7/33]
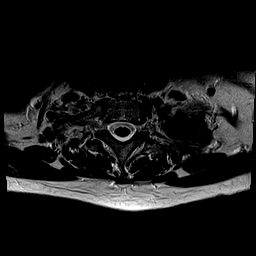
[im 11/33]
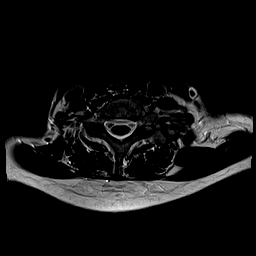
[im 15/33]
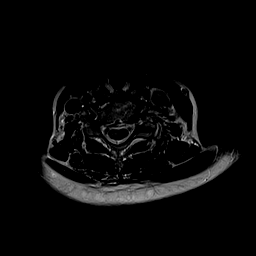
[im 18/33]
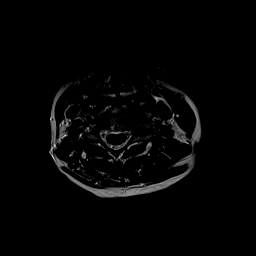
[im 20/33]
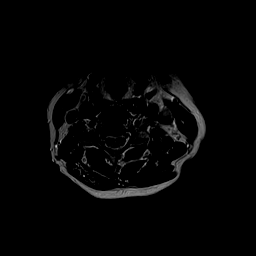
[im 24/33]
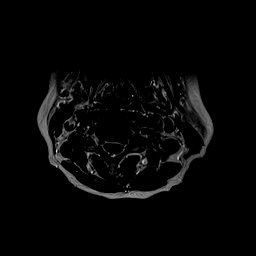
[im 28/33]
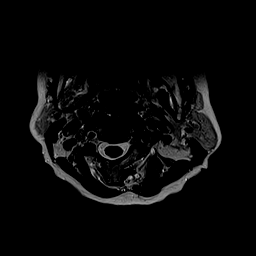
[im 33/33]
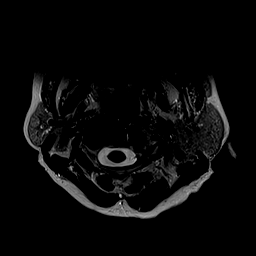

[24 of 48 positions shown; findings below may reference images not displayed]

FINDINGS: Alignment: Reversal of the expected cervical lordosis. Trace C3-C4
anterolisthesis. Grade 1 retrolisthesis at the C4-C5, C5-C6 and
C6-C7 levels. Trace C7-T1 anterolisthesis.

Vertebrae: Vertebral body height is maintained. 8 mm T2/STIR
hyperintense focus with within the C7 vertebra unchanged as compared
to prior MRI 10/08/2010 and likely reflecting an atypical
hemangioma.

Cord: No spinal cord signal abnormality.

Posterior Fossa, vertebral arteries, paraspinal tissues: No
abnormality identified within included portions of the posterior
fossa. Flow voids preserved within the imaged cervical vertebral
arteries. Paraspinal soft tissues within normal limits.

Disc levels:

Unless otherwise stated, the level by level findings below have not
significantly changed since prior MRI 10/08/2010.

Moderate disc degeneration at the C3-C4 through C6-C7 levels,
slightly progressed as compared to prior MRI 10/08/2010. Mild disc
degeneration at the remaining levels.

C2-C3: Shallow central disc protrusion. No significant spinal canal
or foraminal stenosis.

C3-C4: Disc bulge. Uncinate/facet hypertrophy (greater on the left),
progressed. Mild relative spinal canal narrowing. Progressive
moderate left neural foraminal narrowing.

C4-C5: Disc bulge. Uncinate/facet hypertrophy, progressed. No
significant spinal canal stenosis. Progressive bilateral neural
foraminal narrowing (moderate/severe right, moderate left).

C5-C6: Disc bulge. Bilateral disc osteophyte ridge/uncinate
hypertrophy, progressed. Mild relative spinal canal narrowing.
Progressive bilateral neural foraminal narrowing (moderate right,
severe left).

C6-C7: Disc bulge. Bilateral disc osteophyte ridge/uncinate
hypertrophy, progressed. Mild relative narrowing of the spinal
canal. Progressive bilateral neural foraminal narrowing (moderate
right, mild left).

C7-T1: No disc herniation. No significant canal or foraminal
stenosis.
IMPRESSION: Cervical spondylosis as outlined and having progressed at multiple
levels from MRI 10/08/2010. Findings are most notably as follows.

At C5-C6, progressive bilateral disc osteophyte ridge/uncinate
hypertrophy contributes to bilateral neural foraminal narrowing
(moderate right, severe left). Correlate for left C6 radiculopathy.

At C4-C5, progressive uncinate/facet hypertrophy contributes to
bilateral neural foraminal narrowing (moderate/severe right,
moderate left).

Additional sites of mild and moderate neural foraminal narrowing as
described. Of note in this patient with left-sided radiculopathy
symptoms, there is progressive moderate left neural foraminal
narrowing at C3-C4.

No more than mild relative spinal canal narrowing at any level.

## 2021-07-11 ENCOUNTER — Other Ambulatory Visit: Payer: Self-pay

## 2021-07-11 ENCOUNTER — Ambulatory Visit (INDEPENDENT_AMBULATORY_CARE_PROVIDER_SITE_OTHER): Payer: BC Managed Care – PPO | Admitting: Family Medicine

## 2021-07-11 ENCOUNTER — Encounter: Payer: Self-pay | Admitting: Family Medicine

## 2021-07-11 VITALS — BP 104/62 | HR 78 | Temp 98.0°F | Ht 64.0 in | Wt 128.4 lb

## 2021-07-11 DIAGNOSIS — I10 Essential (primary) hypertension: Secondary | ICD-10-CM

## 2021-07-11 DIAGNOSIS — Z0000 Encounter for general adult medical examination without abnormal findings: Secondary | ICD-10-CM | POA: Diagnosis not present

## 2021-07-11 DIAGNOSIS — E559 Vitamin D deficiency, unspecified: Secondary | ICD-10-CM

## 2021-07-11 DIAGNOSIS — Z23 Encounter for immunization: Secondary | ICD-10-CM | POA: Diagnosis not present

## 2021-07-11 DIAGNOSIS — D508 Other iron deficiency anemias: Secondary | ICD-10-CM | POA: Diagnosis not present

## 2021-07-11 DIAGNOSIS — E7800 Pure hypercholesterolemia, unspecified: Secondary | ICD-10-CM

## 2021-07-11 LAB — CBC WITH DIFFERENTIAL/PLATELET
Basophils Absolute: 0 10*3/uL (ref 0.0–0.1)
Basophils Relative: 1 % (ref 0.0–3.0)
Eosinophils Absolute: 0.1 10*3/uL (ref 0.0–0.7)
Eosinophils Relative: 2 % (ref 0.0–5.0)
HCT: 40.7 % (ref 36.0–46.0)
Hemoglobin: 12.9 g/dL (ref 12.0–15.0)
Lymphocytes Relative: 31 % (ref 12.0–46.0)
Lymphs Abs: 1.4 10*3/uL (ref 0.7–4.0)
MCHC: 31.7 g/dL (ref 30.0–36.0)
MCV: 74.2 fl — ABNORMAL LOW (ref 78.0–100.0)
Monocytes Absolute: 0.2 10*3/uL (ref 0.1–1.0)
Monocytes Relative: 5.6 % (ref 3.0–12.0)
Neutro Abs: 2.7 10*3/uL (ref 1.4–7.7)
Neutrophils Relative %: 60.4 % (ref 43.0–77.0)
Platelets: 178 10*3/uL (ref 150.0–400.0)
RBC: 5.48 Mil/uL — ABNORMAL HIGH (ref 3.87–5.11)
RDW: 14.2 % (ref 11.5–15.5)
WBC: 4.4 10*3/uL (ref 4.0–10.5)

## 2021-07-11 LAB — COMPREHENSIVE METABOLIC PANEL
ALT: 20 U/L (ref 0–35)
AST: 31 U/L (ref 0–37)
Albumin: 4.6 g/dL (ref 3.5–5.2)
Alkaline Phosphatase: 86 U/L (ref 39–117)
BUN: 8 mg/dL (ref 6–23)
CO2: 26 mEq/L (ref 19–32)
Calcium: 9.9 mg/dL (ref 8.4–10.5)
Chloride: 104 mEq/L (ref 96–112)
Creatinine, Ser: 0.65 mg/dL (ref 0.40–1.20)
GFR: 88.07 mL/min (ref >60.00)
Glucose, Bld: 80 mg/dL (ref 70–99)
Potassium: 3.7 mEq/L (ref 3.5–5.1)
Sodium: 139 mEq/L (ref 135–145)
Total Bilirubin: 1 mg/dL (ref 0.2–1.2)
Total Protein: 7.1 g/dL (ref 6.0–8.3)

## 2021-07-11 LAB — LIPID PANEL
Cholesterol: 221 mg/dL — ABNORMAL HIGH (ref 0–200)
HDL: 87.9 mg/dL (ref >39.00)
LDL Cholesterol: 118 mg/dL — ABNORMAL HIGH (ref 0–99)
NonHDL: 133.18
Total CHOL/HDL Ratio: 3
Triglycerides: 77 mg/dL (ref 0.0–149.0)
VLDL: 15.4 mg/dL (ref 0.0–40.0)

## 2021-07-11 LAB — IBC + FERRITIN
Ferritin: 148.6 ng/mL (ref 10.0–291.0)
Iron: 149 ug/dL — ABNORMAL HIGH (ref 42–145)
Saturation Ratios: 47.9 % (ref 20.0–50.0)
TIBC: 310.8 ug/dL (ref 250.0–450.0)
Transferrin: 222 mg/dL (ref 212.0–360.0)

## 2021-07-11 LAB — VITAMIN D 25 HYDROXY (VIT D DEFICIENCY, FRACTURES): VITD: 48.7 ng/mL (ref 30.00–100.00)

## 2021-07-11 NOTE — Progress Notes (Signed)
Patient ID: Michelle Gill, female    DOB: 1951/05/11, 70 y.o.   MRN: [id number]  This visit was conducted in person.  BP 104/62   Pulse 78   Temp 98 F (36.7 C) (Temporal)   Ht 5\' 4"  (1.626 m)   Wt 128 lb 6 oz (58.2 kg)   SpO2 100%   BMI 22.04 kg/m    CC: Chief Complaint  Patient presents with   Annual Exam    Subjective:   HPI: Michelle Gill is a 70 y.o. female presenting on 07/11/2021 for Annual Exam  Hypertension:  At goal on HCTZ. No longer on lisinopril BP Readings from Last 3 Encounters:  07/11/21 104/62  03/27/21 120/72  03/18/21 100/72  Using medication without problems or lightheadedness:  none Chest pain with exertion: none Edema:none Short of breath: none Average home BPs: not checking lately. Other issues:  Elevated Cholesterol: Due for re-eval Using medications without problems: Muscle aches:  Diet compliance moderate Exercise:  mowing manually Other complaints:  Wt Readings from Last 3 Encounters:  07/11/21 128 lb 6 oz (58.2 kg)  03/18/21 127 lb 8 oz (57.8 kg)  08/06/20 130 lb 9.6 oz (59.2 kg)   Body mass index is 22.04 kg/m.      Relevant past medical, surgical, family and social history reviewed and updated as indicated. Interim medical history since our last visit reviewed. Allergies and medications reviewed and updated. Outpatient Medications Prior to Visit  Medication Sig Dispense Refill   albuterol (VENTOLIN HFA) 108 (90 Base) MCG/ACT inhaler INHALE 2 PUFFS BY MOUTH EVERY 4 HOURS AS NEEDED 18 each 2   cetirizine-pseudoephedrine (ZYRTEC-D) 5-120 MG tablet Take 1 tablet by mouth daily as needed for allergies.      D3-50 1.25 MG (50000 UT) capsule TAKE 1 CAPSULE BY MOUTH ONE TIME PER WEEK 12 capsule 0   denosumab (PROLIA) 60 MG/ML SOSY injection Inject 60 mg into the skin every 6 (six) months. 1 mL 0   esomeprazole (NEXIUM) 40 MG capsule Take 40 mg by mouth as needed. For acid reflux     ferrous sulfate 324 (65 FE) MG TBEC  Take 1 tablet by mouth daily.     hydrochlorothiazide (HYDRODIURIL) 25 MG tablet Take 1 tablet (25 mg total) by mouth daily. 90 tablet 0   lisinopril (ZESTRIL) 10 MG tablet TAKE 1 TABLET BY MOUTH EVERY DAY 90 tablet 0   omega-3 acid ethyl esters (LOVAZA) 1 g capsule Take by mouth daily.     triamcinolone (NASACORT) 55 MCG/ACT AERO nasal inhaler PLACE 2 SPRAYS IN EACH NOSTRIL ONCE A DAY FOR ALLERGIES 16.9 mL 5   No facility-administered medications prior to visit.     Per HPI unless specifically indicated in ROS section below Review of Systems  Constitutional:  Negative for fatigue and fever.  HENT:  Negative for congestion.   Eyes:  Negative for pain.  Respiratory:  Negative for cough and shortness of breath.   Cardiovascular:  Negative for chest pain, palpitations and leg swelling.  Gastrointestinal:  Negative for abdominal pain.  Genitourinary:  Negative for dysuria and vaginal bleeding.  Musculoskeletal:  Negative for back pain.  Neurological:  Negative for syncope, light-headedness and headaches.  Psychiatric/Behavioral:  Negative for dysphoric mood.   Objective:  BP 104/62   Pulse 78   Temp 98 F (36.7 C) (Temporal)   Ht 5\' 4"  (1.626 m)   Wt 128 lb 6 oz (58.2 kg)   SpO2 100%   BMI 22.04 kg/m  Wt Readings from Last 3 Encounters:  07/11/21 128 lb 6 oz (58.2 kg)  03/18/21 127 lb 8 oz (57.8 kg)  08/06/20 130 lb 9.6 oz (59.2 kg)      Physical Exam Vitals and nursing note reviewed.  Constitutional:      General: She is not in acute distress.    Appearance: Normal appearance. She is well-developed. She is not ill-appearing or toxic-appearing.  HENT:     Head: Normocephalic.     Right Ear: Hearing, tympanic membrane, ear canal and external ear normal.     Left Ear: Hearing, tympanic membrane, ear canal and external ear normal.     Nose: Nose normal.  Eyes:     General: Lids are normal. Lids are everted, no foreign bodies appreciated.     Conjunctiva/sclera: Conjunctivae  normal.     Pupils: Pupils are equal, round, and reactive to light.  Neck:     Thyroid: No thyroid mass or thyromegaly.     Vascular: No carotid bruit.     Trachea: Trachea normal.  Cardiovascular:     Rate and Rhythm: Normal rate and regular rhythm.     Heart sounds: Normal heart sounds, S1 normal and S2 normal. No murmur heard.   No gallop.  Pulmonary:     Effort: Pulmonary effort is normal. No respiratory distress.     Breath sounds: Normal breath sounds. No wheezing, rhonchi or rales.  Abdominal:     General: Bowel sounds are normal. There is no distension or abdominal bruit.     Palpations: Abdomen is soft. There is no fluid wave or mass.     Tenderness: There is no abdominal tenderness. There is no guarding or rebound.     Hernia: No hernia is present.  Musculoskeletal:     Cervical back: Normal range of motion and neck supple.  Lymphadenopathy:     Cervical: No cervical adenopathy.  Skin:    General: Skin is warm and dry.     Findings: No rash.  Neurological:     Mental Status: She is alert.     Cranial Nerves: No cranial nerve deficit.     Sensory: No sensory deficit.  Psychiatric:        Mood and Affect: Mood is not anxious or depressed.        Speech: Speech normal.        Behavior: Behavior normal. Behavior is cooperative.        Judgment: Judgment normal.      Results for orders placed or performed in visit on 28/41/32  Basic Metabolic Panel  Result Value Ref Range   Sodium 139 135 - 145 mEq/L   Potassium 3.4 (L) 3.5 - 5.1 mEq/L   Chloride 101 96 - 112 mEq/L   CO2 31 19 - 32 mEq/L   Glucose, Bld 91 70 - 99 mg/dL   BUN 8 6 - 23 mg/dL   Creatinine, Ser 0.64 0.40 - 1.20 mg/dL   GFR 88.46 >60.00 mL/min   Calcium 10.1 8.4 - 10.5 mg/dL    This visit occurred during the SARS-CoV-2 public health emergency.  Safety protocols were in place, including screening questions prior to the visit, additional usage of staff PPE, and extensive cleaning of exam room while  observing appropriate contact time as indicated for disinfecting solutions.   COVID 19 screen:  No recent travel or known exposure to COVID19 The patient denies respiratory symptoms of COVID 19 at this time. The importance of social distancing was  discussed today.   Assessment and Plan The patient's preventative maintenance and recommended screening tests for an annual wellness exam were reviewed in full today. Brought up to date unless services declined.  Counselled on the importance of diet, exercise, and its role in overall health and mortality. The patient's FH and SH was reviewed, including their home life, tobacco status, and drug and alcohol status.   [place] [facility] Visit from 07/11/2021 in [place] at [facility] Total Score 0       Mammogram  negative 06/02/2021 Colonoscopy 2010, Dr. Deatra Ina, plans repeat in 2020.  Cologuard neg 2021 Last DEXA: 03/2018 improved and almost in osteopenia range SE to evista and SE fosamax.  On prolia now .Marland Kitchen PAP/DVE: no pap indicated partial hystec, Vaccines:uptodate with PNA, flu given , zoster ( can consider shingrix as well),  COVID x 3 prevnar . Nonsmoker Hep C: done   Problem List Items Addressed This Visit     Anemia, iron deficiency   Relevant Orders   CBC with Differential/Platelet   IBC + Ferritin   Benign essential HTN    Stable, chronic.  Continue current medication.    HCTZ 25 mg daily      High cholesterol   Relevant Orders   Lipid panel   Comprehensive metabolic panel   Vitamin D deficiency   Relevant Orders   VITAMIN D 25 Hydroxy (Vit-D Deficiency, Fractures)   Other Visit Diagnoses     Routine general medical examination at a health care facility    -  Primary   Need for influenza vaccination       Relevant Orders   Flu Vaccine QUAD High Dose(Fluad) (Completed)        Eliezer Lofts, MD

## 2021-07-11 NOTE — Assessment & Plan Note (Signed)
Stable, chronic.  Continue current medication.  HCTZ 25 mg daily 

## 2021-07-15 ENCOUNTER — Ambulatory Visit (INDEPENDENT_AMBULATORY_CARE_PROVIDER_SITE_OTHER): Payer: BC Managed Care – PPO

## 2021-07-15 ENCOUNTER — Other Ambulatory Visit: Payer: Self-pay

## 2021-07-15 DIAGNOSIS — Z23 Encounter for immunization: Secondary | ICD-10-CM | POA: Diagnosis not present

## 2021-07-15 NOTE — Progress Notes (Signed)
Patient presented for shingrix injection to right deltoid, patient voiced no concerns nor showed any signs of distress during injection. 

## 2021-07-18 ENCOUNTER — Ambulatory Visit: Payer: BC Managed Care – PPO

## 2021-07-18 ENCOUNTER — Telehealth: Payer: Self-pay | Admitting: Family Medicine

## 2021-07-18 NOTE — Telephone Encounter (Signed)
Pt called asking when should she get her 2nd shingle shot. Please advise.

## 2021-07-18 NOTE — Telephone Encounter (Signed)
Mrs. Lumsden notified by telephone that her next shingrix is due 09/14/20 at the earliest.  Patient will call back to schedule nurse visit.

## 2021-07-29 ENCOUNTER — Ambulatory Visit: Payer: BC Managed Care – PPO | Attending: Internal Medicine

## 2021-07-29 ENCOUNTER — Other Ambulatory Visit (HOSPITAL_BASED_OUTPATIENT_CLINIC_OR_DEPARTMENT_OTHER): Payer: Self-pay

## 2021-07-29 DIAGNOSIS — Z23 Encounter for immunization: Secondary | ICD-10-CM

## 2021-07-29 MED ORDER — PFIZER COVID-19 VAC BIVALENT 30 MCG/0.3ML IM SUSP
INTRAMUSCULAR | 0 refills | Status: DC
  Filled 2021-07-29: qty 0.3, 1d supply, fill #0

## 2021-07-29 NOTE — Progress Notes (Signed)
   Covid-19 Vaccination Clinic  Name:  Michelle Gill    MRN: [id number] DOB: [birth year]  07/29/2021  [name] was observed post Covid-19 immunization for 15 minutes without incident. She was provided with Vaccine Information Sheet and instruction to access the V-Safe system.   [name] was instructed to call 911 with any severe reactions post vaccine: Difficulty breathing  Swelling of face and throat  A fast heartbeat  A bad rash all over body  Dizziness and weakness   Immunizations Administered     Name Date Dose VIS Date Route   Pfizer Covid-19 Vaccine Bivalent Booster 07/29/2021  2:52 PM 0.3 mL 05/07/2021 Intramuscular   Manufacturer: [place]   Lot: [id number]   [place]: [phone]

## 2021-09-17 ENCOUNTER — Other Ambulatory Visit: Payer: Self-pay

## 2021-09-17 ENCOUNTER — Ambulatory Visit (INDEPENDENT_AMBULATORY_CARE_PROVIDER_SITE_OTHER): Payer: BC Managed Care – PPO

## 2021-09-17 DIAGNOSIS — Z23 Encounter for immunization: Secondary | ICD-10-CM

## 2021-09-17 NOTE — Progress Notes (Signed)
Per orders of Dr. Lorelei Pont, in Dr. Rometta Emery abssence, 2nd injection of shingrix given by Loreen Freud. Patient tolerated injection well.

## 2021-09-19 ENCOUNTER — Other Ambulatory Visit: Payer: Self-pay | Admitting: Family Medicine

## 2021-10-01 ENCOUNTER — Other Ambulatory Visit: Payer: Self-pay | Admitting: Family Medicine

## 2021-10-08 ENCOUNTER — Telehealth: Payer: Self-pay | Admitting: Family Medicine

## 2021-10-08 NOTE — Telephone Encounter (Signed)
Spoke with Michelle Gill.  She states she had spoken with Michelle Gill at her physical about needing bridge work but that the dentist told her she didn't have enough bone on the right side of her jaw to do the bridge work. Her dentist wants to do an implant.  At that visit Michelle Gill and Michelle Gill both agreed that with the patient's Osteoporosis and being on Prolia, that an implant would not be a good idea.  Patient states she went back to the dentist today for her cleaning and the dentist started pushing for the implant again and when Michelle Gill told him what Michelle Gill said about not being a good idea for the implant.  She states the dentist ask for Michelle Gill phone number because he wanted to call and discuss this with Michelle Gill himself.  So Michelle Gill just wanted to give Dr Diona Gill a heads up that her Dentist would be calling her about this.   She also states she is not inclined to do the implant.

## 2021-10-08 NOTE — Telephone Encounter (Signed)
Pt called asking for a call back in reference to call that had discussed about pt dentist (Dr Moses Manners). Pt would like for you to call her on her cell at [phone]. Please advise.

## 2021-10-09 NOTE — Telephone Encounter (Signed)
Noted  

## 2021-11-11 DIAGNOSIS — H25813 Combined forms of age-related cataract, bilateral: Secondary | ICD-10-CM | POA: Diagnosis not present

## 2021-11-12 DIAGNOSIS — M25561 Pain in right knee: Secondary | ICD-10-CM | POA: Diagnosis not present

## 2021-11-12 DIAGNOSIS — M25562 Pain in left knee: Secondary | ICD-10-CM | POA: Diagnosis not present

## 2021-11-24 ENCOUNTER — Other Ambulatory Visit: Payer: Self-pay

## 2021-11-24 DIAGNOSIS — M810 Age-related osteoporosis without current pathological fracture: Secondary | ICD-10-CM

## 2021-11-24 MED ORDER — DENOSUMAB 60 MG/ML ~~LOC~~ SOSY: 60.0000 mg | PREFILLED_SYRINGE | SUBCUTANEOUS | 0 refills | Status: DC

## 2021-11-26 ENCOUNTER — Telehealth: Payer: Self-pay | Admitting: Family Medicine

## 2021-11-26 NOTE — Telephone Encounter (Signed)
?  Encourage patient to contact the pharmacy for refills or they can request refills through [facility] ? ?LAST APPOINTMENT DATE:  Please schedule appointment if longer than 1 year ? ?NEXT APPOINTMENT DATE: ? ?MEDICATION:denosumab (PROLIA) 60 MG/ML SOSY injection ? ?Is the patient out of medication?  ? ?[address] [place] [address] [place] [address] [phone] ? ?Let patient know to contact pharmacy at the end of the day to make sure medication is ready. ? ?Please notify patient to allow 48-72 hours to process ? ?CLINICAL FILLS OUT ALL BELOW:  ? ?LAST REFILL: ? ?QTY: ? ?REFILL DATE: ? ? ? ?OTHER COMMENTS:  ? ? ?Okay for refill? ? ?Please advise ? ? ?  ?

## 2021-11-27 NOTE — Telephone Encounter (Signed)
Will work on this. ?RX for Prolia was refilled by mistake by another CMA on 11/26/21 and [facility]. [facility] and got [facility]. ?

## 2021-12-10 ENCOUNTER — Telehealth: Payer: Self-pay

## 2021-12-10 DIAGNOSIS — M810 Age-related osteoporosis without current pathological fracture: Secondary | ICD-10-CM

## 2021-12-10 NOTE — Telephone Encounter (Signed)
Benefits submitted-pending ?Next injection due 12/19/21 or after ?Need to verify if patient still want to use mail order pharmacy. ?

## 2021-12-15 ENCOUNTER — Telehealth: Payer: Self-pay

## 2021-12-15 NOTE — Telephone Encounter (Signed)
Patient called states that [facility]lia [facility] but was told that one was not called in.  ?

## 2021-12-16 MED ORDER — DENOSUMAB 60 MG/ML ~~LOC~~ SOSY: 60.0000 mg | PREFILLED_SYRINGE | SUBCUTANEOUS | 0 refills | Status: DC

## 2021-12-16 NOTE — Telephone Encounter (Signed)
See other phone note in regards to this ?

## 2021-12-16 NOTE — Telephone Encounter (Signed)
Called [employer] and was told they could not tell me the co pay amount patient will have but that it will be over $25. I was given discount program copay card phone number [phone] that patient has to call to activate and then patient would need to call [facility] specialty department [phone] and let them know the co pay card information to process and then patient would need to let me know so I can call [facility] back to get the shipment discussed for Prolia to our office. ?Patient was advised and understood, patient will call me back when those steps are done or send a mychart message. ?

## 2021-12-16 NOTE — Addendum Note (Signed)
Addended by: Kris Mouton on: 12/16/2021 11:21 AM ? ? Modules accepted: Orders ? ?

## 2021-12-16 NOTE — Telephone Encounter (Signed)
Spoke with patient. Patient states that one [facility] and [facility] to [facility] pharmacy and patient would use a coupon and pay $25. Last year it was sent to [facility] mail order directly and insurance covered with a $25 co pay. Patient was not sure which way to proceed. Discussed options. Patient did say also [facility] mail order was changed to [facility] order pharmacy. Advised patient I will go ahead and send it to mail order directly and will call them later today to follow up on the price through them. Advised patient if she hears from them first today to call me back and let me know what information she found out for price. ?Lab appointment made for 12/17/21 and NV on 12/24/21 ?

## 2021-12-17 ENCOUNTER — Other Ambulatory Visit (INDEPENDENT_AMBULATORY_CARE_PROVIDER_SITE_OTHER): Payer: BC Managed Care – PPO

## 2021-12-17 DIAGNOSIS — M810 Age-related osteoporosis without current pathological fracture: Secondary | ICD-10-CM | POA: Diagnosis not present

## 2021-12-17 LAB — BASIC METABOLIC PANEL WITH GFR
BUN: 7 mg/dL (ref 6–23)
CO2: 28 meq/L (ref 19–32)
Calcium: 9.5 mg/dL (ref 8.4–10.5)
Chloride: 102 meq/L (ref 96–112)
Creatinine, Ser: 0.7 mg/dL (ref 0.40–1.20)
GFR: 86.24 mL/min
Glucose, Bld: 91 mg/dL (ref 70–99)
Potassium: 3.5 meq/L (ref 3.5–5.1)
Sodium: 137 meq/L (ref 135–145)

## 2021-12-17 NOTE — Telephone Encounter (Signed)
CrCl is 66.75 mL/min. Calcium was normal 9.5. ?Spoke with patient and she has spoken with [facility] and is able to get Prolia with them for $25-the reason it was going to be more bc pharmacy thought patient had Medicare medication plan but it is only Part A. I called [facility] and confirmed our address and shipment date-12/23/21. ?Patient aware. ?

## 2021-12-17 NOTE — Progress Notes (Signed)
Done

## 2021-12-24 ENCOUNTER — Ambulatory Visit (INDEPENDENT_AMBULATORY_CARE_PROVIDER_SITE_OTHER): Payer: BC Managed Care – PPO

## 2021-12-24 DIAGNOSIS — M810 Age-related osteoporosis without current pathological fracture: Secondary | ICD-10-CM | POA: Diagnosis not present

## 2021-12-24 MED ORDER — DENOSUMAB 60 MG/ML ~~LOC~~ SOSY
60.0000 mg | PREFILLED_SYRINGE | Freq: Once | SUBCUTANEOUS | Status: AC
  Administered 2021-12-24: 60 mg via SUBCUTANEOUS

## 2021-12-24 MED ORDER — MEDROXYPROGESTERONE ACETATE 150 MG/ML IM SUSP: 150.0000 mg | Freq: Once | INTRAMUSCULAR | Status: DC

## 2021-12-24 NOTE — Progress Notes (Signed)
Per orders of Dr. Alva Garnet signed by Dr Lorelei Pont in her absence, injection of Prolia given by Kris Mouton. ?Patient tolerated injection well.  ?

## 2022-01-08 ENCOUNTER — Encounter: Payer: Self-pay | Admitting: Family Medicine

## 2022-01-08 ENCOUNTER — Ambulatory Visit: Payer: BC Managed Care – PPO | Admitting: Family Medicine

## 2022-01-08 VITALS — BP 110/60 | HR 83 | Temp 98.2°F | Ht 64.0 in | Wt 129.1 lb

## 2022-01-08 DIAGNOSIS — W57XXXA Bitten or stung by nonvenomous insect and other nonvenomous arthropods, initial encounter: Secondary | ICD-10-CM

## 2022-01-08 DIAGNOSIS — M2550 Pain in unspecified joint: Secondary | ICD-10-CM

## 2022-01-08 DIAGNOSIS — R051 Acute cough: Secondary | ICD-10-CM | POA: Diagnosis not present

## 2022-01-08 DIAGNOSIS — S30860A Insect bite (nonvenomous) of lower back and pelvis, initial encounter: Secondary | ICD-10-CM

## 2022-01-08 LAB — POC COVID19 BINAXNOW: SARS Coronavirus 2 Ag: NEGATIVE

## 2022-01-08 MED ORDER — DOXYCYCLINE HYCLATE 100 MG PO TABS: 100.0000 mg | ORAL_TABLET | Freq: Two times a day (BID) | ORAL | 0 refills | Status: DC

## 2022-01-08 NOTE — Progress Notes (Signed)
? ? ?Michelle Luster T. Ryanne Morand, MD, [facility] ?Therapist, music at [facility] ?[address] ?[place], [address] ? ?Phone: [phone]  FAX: [phone] ? ?Michelle Gill - 71 y.o. female  MRN [id number]  Date of Birth: 1951/04/07 ? ?Date: 01/08/2022  PCP: Jinny Sanders, MD  Referral: Jinny Sanders, MD ? ?Chief Complaint  ?Patient presents with  ? Sore Throat  ?  Negative home Covid test yesterday ?Symptoms started 3 days ago  ? Cough  ? Fatigue  ? Insect Bite  ?  Tick bite last week  ? Facial Pressure  ? ? ?This visit occurred during the SARS-CoV-2 public health emergency.  Safety protocols were in place, including screening questions prior to the visit, additional usage of staff PPE, and extensive cleaning of exam room while observing appropriate contact time as indicated for disinfecting solutions.  ? ?Subjective:  ? ?Michelle Gill is a 71 y.o. very pleasant female patient with Body mass index is 22.16 kg/m?. who presents with the following: ? ?Acute illness as above. ? ?The last few days, will sit down and has a tickle in her throat and it was feeling really bad.  She did not do a COVID-19 test that was negative yesterday.  Sore throat is relatively significant. ? ?She is having some fatigue, nasal congestion, as well as coughing and runny nose. ? ?Has a history of anemia.  ?Had a low potassium. ? ?Did have a tick bite and was outside for a couple of hours.  WIth washing her back, later that back.  Did not feel anythign.  Felt a bump.  Got everything out. ? ?Covid test yesterday was negative.  ? ?Review of Systems is noted in the HPI, as appropriate ? ?Objective:  ? ?BP 110/60   Pulse 83   Temp 98.2 ?F (36.8 ?C) (Oral)   Ht '5\' 4"'$  (1.626 m)   Wt 129 lb 2 oz (58.6 kg)   SpO2 98%   BMI 22.16 kg/m?  ? ? ?Gen: WDWN, NAD. Globally Non-toxic ?HEENT: Throat clear, w/o exudate, R TM clear, L TM - good landmarks, No fluid present. rhinnorhea.  MMM ?Frontal sinuses: NT ?Max  sinuses: NT ?NECK: Anterior cervical  LAD is absent ?CV: RRR, No M/G/R, cap refill <2 sec ?PULM: Breathing comfortably in no respiratory distress. no wheezing, crackles, rhonchi  ? ?Laboratory and Imaging Data: ?Results for orders placed or performed in visit on 01/08/22  ?[place] COVID-19  ?Result Value Ref Range  ? SARS Coronavirus 2 Ag Negative Negative  ?  ? ?Assessment and Plan:  ? ?  ICD-10-CM   ?1. Polyarthralgia  M25.50   ?  ?2. Acute cough  R05.1 POC COVID-19  ?  ?3. Tick bite of back, initial encounter  S30.860A   ? W57.Merril Abbe   ?  ? ?Generalized illness in the setting of recent tick bite with engorgement.  COVID-19 is negative. ? ?In this setting, I do think that while in generalized viral syndrome is more likely, with recent tick bite, there is need to treat with doxycycline. ? ?Meds ordered this encounter  ?Medications  ? doxycycline (VIBRA-TABS) 100 MG tablet  ?  Sig: Take 1 tablet (100 mg total) by mouth 2 (two) times daily.  ?  Dispense:  20 tablet  ?  Refill:  0  ? ?There are no discontinued medications. ?Orders Placed This Encounter  ?Procedures  ? [place] COVID-19  ? ? ?Follow-up: No follow-ups on file. ? ?Systems analyst One Therapist, art  was used for transcription in this dictation.  Possible transcriptional errors can occur using Editor, commissioning.  ? ?Signed, ? ?Jolina Symonds T. Brok Stocking, MD ? ? ?Outpatient Encounter Medications as of 01/08/2022  ?Medication Sig  ? albuterol (VENTOLIN HFA) 108 (90 Base) MCG/ACT inhaler INHALE 2 PUFFS BY MOUTH EVERY 4 HOURS AS NEEDED  ? cetirizine-pseudoephedrine (ZYRTEC-D) 5-120 MG tablet Take 1 tablet by mouth daily as needed for allergies.   ? COVID-19 mRNA bivalent vaccine, Pfizer, (PFIZER COVID-19 VAC BIVALENT) injection Inject into the muscle.  ? D3-50 1.25 MG (50000 UT) capsule TAKE 1 CAPSULE BY MOUTH ONE TIME PER WEEK  ? denosumab (PROLIA) 60 MG/ML SOSY injection Inject 60 mg into the skin every 6 (six) months.  ? doxycycline (VIBRA-TABS) 100 MG tablet Take 1  tablet (100 mg total) by mouth 2 (two) times daily.  ? esomeprazole (NEXIUM) 40 MG capsule Take 40 mg by mouth as needed. For acid reflux  ? ferrous sulfate 324 (65 FE) MG TBEC Take 1 tablet by mouth daily.  ? hydrochlorothiazide (HYDRODIURIL) 25 MG tablet TAKE 1 TABLET (25 MG TOTAL) BY MOUTH DAILY.  ? omega-3 acid ethyl esters (LOVAZA) 1 g capsule Take by mouth daily.  ? triamcinolone (NASACORT) 55 MCG/ACT AERO nasal inhaler PLACE 2 SPRAYS IN EACH NOSTRIL ONCE A DAY FOR ALLERGIES  ? ?No facility-administered encounter medications on file as of 01/08/2022.  ?  ?

## 2022-01-12 ENCOUNTER — Telehealth: Payer: Self-pay | Admitting: Family Medicine

## 2022-01-12 MED ORDER — AMOXICILLIN 875 MG PO TABS: 875.0000 mg | ORAL_TABLET | Freq: Two times a day (BID) | ORAL | 0 refills | Status: DC

## 2022-01-12 MED ORDER — AZITHROMYCIN 250 MG PO TABS: ORAL_TABLET | ORAL | 0 refills | Status: DC

## 2022-01-12 NOTE — Telephone Encounter (Signed)
Spoke with Mrs. Saltos.  She states azithromycin makes her sick as well.  She states she tolerates Augmentin.  Please advise.  ?

## 2022-01-12 NOTE — Telephone Encounter (Signed)
Pt called about her appt from last week, she said the the medication that Dr Lorelei Pont prescribed was to strong and asked if something could be sent in that wasn't as strong, callback is [phone]. She only took 2 pills Friday of the doxycycline (VIBRA-TABS) 100 MG tablet and she said it made her feel terrible, she did spread them out as well, she didn't take at the same time ?

## 2022-01-12 NOTE — Telephone Encounter (Signed)
Ok ? ?Change to  ? ?Azithromycin 250 mg ?2 tabs po on day 1, then 1 tab po for 4 days  ?#6, 0 ref ?

## 2022-01-12 NOTE — Telephone Encounter (Signed)
Amoxicillin 875 mg, 1 po BID, #20, 0 ref  ?

## 2022-01-12 NOTE — Telephone Encounter (Signed)
Michelle Gill notified that Rx for Amoxicillin 875 mg has been sent to her pharmacy.  Patient states understanding.  ?

## 2022-06-01 ENCOUNTER — Other Ambulatory Visit: Payer: Self-pay | Admitting: Family Medicine

## 2022-06-01 DIAGNOSIS — M810 Age-related osteoporosis without current pathological fracture: Secondary | ICD-10-CM

## 2022-06-17 ENCOUNTER — Other Ambulatory Visit (HOSPITAL_BASED_OUTPATIENT_CLINIC_OR_DEPARTMENT_OTHER): Payer: Self-pay

## 2022-06-17 MED ORDER — COVID-19 MRNA 2023-2024 VACCINE (COMIRNATY) 0.3 ML INJECTION
INTRAMUSCULAR | 0 refills | Status: DC
  Filled 2022-06-17: qty 0.3, 1d supply, fill #0

## 2022-06-25 ENCOUNTER — Telehealth: Payer: Self-pay

## 2022-06-25 NOTE — Telephone Encounter (Signed)
Left message to call back. Next injection due 06/26/22. PA done 05/01/22-05/01/23 RX was sent to [facility] already, did patient receive this yet?

## 2022-06-26 NOTE — Telephone Encounter (Signed)
Noted, fyi to PCP

## 2022-06-26 NOTE — Telephone Encounter (Signed)
Patient called in returning a call she received. She stated that she spoke to the pharmacy and she is having some dental work done and pushed it back until February of next year. She said feel free to call her back if you need to.

## 2022-06-26 NOTE — Telephone Encounter (Signed)
Noted  

## 2022-07-09 ENCOUNTER — Telehealth: Payer: Self-pay | Admitting: Family Medicine

## 2022-07-09 DIAGNOSIS — E559 Vitamin D deficiency, unspecified: Secondary | ICD-10-CM

## 2022-07-09 DIAGNOSIS — I10 Essential (primary) hypertension: Secondary | ICD-10-CM

## 2022-07-09 DIAGNOSIS — Z862 Personal history of diseases of the blood and blood-forming organs and certain disorders involving the immune mechanism: Secondary | ICD-10-CM

## 2022-07-09 DIAGNOSIS — Z131 Encounter for screening for diabetes mellitus: Secondary | ICD-10-CM

## 2022-07-09 DIAGNOSIS — E7800 Pure hypercholesterolemia, unspecified: Secondary | ICD-10-CM

## 2022-07-09 NOTE — Telephone Encounter (Signed)
-----   Message from Ellamae Sia sent at 06/30/2022  3:16 PM EDT ----- Regarding: Lab orders for Friday, 11.3.23 Patient is scheduled for CPX labs, please order future labs, Thanks , Karna Christmas

## 2022-07-10 ENCOUNTER — Other Ambulatory Visit (INDEPENDENT_AMBULATORY_CARE_PROVIDER_SITE_OTHER): Payer: BC Managed Care – PPO

## 2022-07-10 DIAGNOSIS — Z131 Encounter for screening for diabetes mellitus: Secondary | ICD-10-CM | POA: Diagnosis not present

## 2022-07-10 DIAGNOSIS — E7800 Pure hypercholesterolemia, unspecified: Secondary | ICD-10-CM

## 2022-07-10 DIAGNOSIS — E559 Vitamin D deficiency, unspecified: Secondary | ICD-10-CM | POA: Diagnosis not present

## 2022-07-10 DIAGNOSIS — Z862 Personal history of diseases of the blood and blood-forming organs and certain disorders involving the immune mechanism: Secondary | ICD-10-CM | POA: Diagnosis not present

## 2022-07-10 LAB — CBC WITH DIFFERENTIAL/PLATELET
Basophils Absolute: 0 10*3/uL (ref 0.0–0.1)
Basophils Relative: 0.7 % (ref 0.0–3.0)
Eosinophils Absolute: 0.2 10*3/uL (ref 0.0–0.7)
Eosinophils Relative: 4.5 % (ref 0.0–5.0)
HCT: 39.7 % (ref 36.0–46.0)
Hemoglobin: 12.7 g/dL (ref 12.0–15.0)
Lymphocytes Relative: 33.8 % (ref 12.0–46.0)
Lymphs Abs: 1.8 10*3/uL (ref 0.7–4.0)
MCHC: 31.9 g/dL (ref 30.0–36.0)
MCV: 73.6 fl — ABNORMAL LOW (ref 78.0–100.0)
Monocytes Absolute: 0.5 10*3/uL (ref 0.1–1.0)
Monocytes Relative: 9.1 % (ref 3.0–12.0)
Neutro Abs: 2.8 10*3/uL (ref 1.4–7.7)
Neutrophils Relative %: 51.9 % (ref 43.0–77.0)
Platelets: 181 10*3/uL (ref 150.0–400.0)
RBC: 5.39 Mil/uL — ABNORMAL HIGH (ref 3.87–5.11)
RDW: 14 % (ref 11.5–15.5)
WBC: 5.4 10*3/uL (ref 4.0–10.5)

## 2022-07-10 LAB — COMPREHENSIVE METABOLIC PANEL
ALT: 18 U/L (ref 0–35)
AST: 21 U/L (ref 0–37)
Albumin: 4.4 g/dL (ref 3.5–5.2)
Alkaline Phosphatase: 63 U/L (ref 39–117)
BUN: 7 mg/dL (ref 6–23)
CO2: 32 mEq/L (ref 19–32)
Calcium: 10 mg/dL (ref 8.4–10.5)
Chloride: 100 mEq/L (ref 96–112)
Creatinine, Ser: 0.66 mg/dL (ref 0.40–1.20)
GFR: 87.13 mL/min (ref >60.00)
Glucose, Bld: 101 mg/dL — ABNORMAL HIGH (ref 70–99)
Potassium: 3.9 mEq/L (ref 3.5–5.1)
Sodium: 138 mEq/L (ref 135–145)
Total Bilirubin: 1.1 mg/dL (ref 0.2–1.2)
Total Protein: 6.8 g/dL (ref 6.0–8.3)

## 2022-07-10 LAB — LIPID PANEL
Cholesterol: 214 mg/dL — ABNORMAL HIGH (ref 0–200)
HDL: 93.8 mg/dL (ref >39.00)
LDL Cholesterol: 106 mg/dL — ABNORMAL HIGH (ref 0–99)
NonHDL: 119.8
Total CHOL/HDL Ratio: 2
Triglycerides: 68 mg/dL (ref 0.0–149.0)
VLDL: 13.6 mg/dL (ref 0.0–40.0)

## 2022-07-10 LAB — HEMOGLOBIN A1C: Hgb A1c MFr Bld: 5.9 % (ref 4.6–6.5)

## 2022-07-10 LAB — VITAMIN D 25 HYDROXY (VIT D DEFICIENCY, FRACTURES): VITD: 59.47 ng/mL (ref 30.00–100.00)

## 2022-07-10 NOTE — Progress Notes (Signed)
No critical labs need to be addressed urgently. We will discuss labs in detail at upcoming office visit.   

## 2022-07-16 ENCOUNTER — Ambulatory Visit (INDEPENDENT_AMBULATORY_CARE_PROVIDER_SITE_OTHER): Payer: BC Managed Care – PPO | Admitting: Family Medicine

## 2022-07-16 ENCOUNTER — Encounter: Payer: Self-pay | Admitting: Family Medicine

## 2022-07-16 VITALS — BP 102/60 | HR 96 | Temp 98.0°F | Ht 63.5 in | Wt 127.5 lb

## 2022-07-16 DIAGNOSIS — E559 Vitamin D deficiency, unspecified: Secondary | ICD-10-CM

## 2022-07-16 DIAGNOSIS — E7800 Pure hypercholesterolemia, unspecified: Secondary | ICD-10-CM

## 2022-07-16 DIAGNOSIS — Z0000 Encounter for general adult medical examination without abnormal findings: Secondary | ICD-10-CM | POA: Diagnosis not present

## 2022-07-16 DIAGNOSIS — D508 Other iron deficiency anemias: Secondary | ICD-10-CM

## 2022-07-16 DIAGNOSIS — Z23 Encounter for immunization: Secondary | ICD-10-CM

## 2022-07-16 DIAGNOSIS — M810 Age-related osteoporosis without current pathological fracture: Secondary | ICD-10-CM

## 2022-07-16 DIAGNOSIS — I10 Essential (primary) hypertension: Secondary | ICD-10-CM

## 2022-07-16 DIAGNOSIS — K5909 Other constipation: Secondary | ICD-10-CM

## 2022-07-16 DIAGNOSIS — R7303 Prediabetes: Secondary | ICD-10-CM

## 2022-07-16 MED ORDER — LINACLOTIDE 72 MCG PO CAPS: 72.0000 ug | ORAL_CAPSULE | Freq: Every day | ORAL | 11 refills | Status: DC

## 2022-07-16 NOTE — Assessment & Plan Note (Signed)
Chronic, since childhood likely chronic idiopathic constipation. She has tried and failed multiple medications over the years including Colace, MiraLAX, increased water and fiber.  She has tried a family member's Linzess with no side effects and good results.  We will start a trial of Linzess 72 mcg daily.

## 2022-07-16 NOTE — Patient Instructions (Addendum)
Start trial of linzess for chronic constipation. Call if cost prohibitive or side effects.  Please call the location of your choice from the menu below to schedule your Mammogram and/or Bone Density appointment.    [facility]                      Phone:  [phone], [place] [address]                                                             Services: Traditional and 3D Mammogram, [place] [facility]                 Phone: [phone] [address], [place] [address]    Service: Bone Density ONLY   *this site does NOT perform mammograms  [facility]                        Phone:  [phone] [address]                                  [place], [place] [address]                                            Services:  3D Mammogram and Bone Density

## 2022-07-16 NOTE — Assessment & Plan Note (Signed)
Chronic, resolved with supplementation

## 2022-07-16 NOTE — Assessment & Plan Note (Signed)
New diagnosis, likely due to increased candy lately.  She will get back on track with healthy eating, low-carb.  She does have a strong family history of diabetes.

## 2022-07-16 NOTE — Assessment & Plan Note (Signed)
Stable, chronic.  Continue current medication.  HCTZ 25 mg p.o. daily

## 2022-07-16 NOTE — Progress Notes (Signed)
Patient ID: Michelle Gill, female    DOB: 08-27-1951, 71 y.o.   MRN: [id number]  This visit was conducted in person.  BP 102/60   Pulse 96   Temp 98 F (36.7 C) (Oral)   Ht 5' 3.5" (1.613 m)   Wt 127 lb 8 oz (57.8 kg)   SpO2 98%   BMI 22.23 kg/m    CC:  Chief Complaint  Patient presents with   Annual Exam    Subjective:   HPI: Michelle Gill is a 71 y.o. female presenting on 07/16/2022 for Annual Exam  Hypertension: Well-controlled on hydrochlorothiazide 25 mg daily BP Readings from Last 3 Encounters:  07/16/22 102/60  01/08/22 110/60  07/11/21 104/62  Using medication without problems or lightheadedness:  none Chest pain with exertion:none Edema: none Short of breath:none Average home BPs: Other issues:  Elevated Cholesterol: Lab Results  Component Value Date   CHOL 214 (H) 07/10/2022   HDL 93.80 07/10/2022   LDLCALC 106 (H) 07/10/2022   LDLDIRECT 116.0 05/24/2013   TRIG 68.0 07/10/2022   CHOLHDL 2 07/10/2022  The 10-year ASCVD risk score (Arnett DK, et al., 2019) is: 11.9%   Values used to calculate the score:     Age: 20 years     Sex: Female     Is Non-Hispanic African American: Yes     Diabetic: No     Tobacco smoker: No     Systolic Blood Pressure: 194 mmHg     Is BP treated: Yes     HDL Cholesterol: 93.8 mg/dL     Total Cholesterol: 214 mg/dL Using medications without problems: Muscle aches:  Diet compliance: good, but increase candy Exercise: very active.. push mowing. Other complaints:  Vit D def: Stable control  Prediabetes glucose 101 eating more candy lately. Lab Results  Component Value Date   HGBA1C 5.9 07/10/2022    She has tried her family members linzess and it worked well for her.  She strains with BM.Marland Kitchen even when good it is firm.  Long term issue for her since childhood.  Has tried and failed miralax, fiber colace  with limited or no benefit.     Relevant past medical, surgical, family and social history reviewed  and updated as indicated. Interim medical history since our last visit reviewed. Allergies and medications reviewed and updated. Outpatient Medications Prior to Visit  Medication Sig Dispense Refill   albuterol (VENTOLIN HFA) 108 (90 Base) MCG/ACT inhaler INHALE 2 PUFFS BY MOUTH EVERY 4 HOURS AS NEEDED 18 each 2   cetirizine-pseudoephedrine (ZYRTEC-D) 5-120 MG tablet Take 1 tablet by mouth daily as needed for allergies.      chlorhexidine (PERIDEX) 0.12 % solution SMARTSIG:By Mouth     D3-50 1.25 MG (50000 UT) capsule TAKE 1 CAPSULE BY MOUTH ONE TIME PER WEEK 12 capsule 0   esomeprazole (NEXIUM) 40 MG capsule Take 40 mg by mouth as needed. For acid reflux     ferrous sulfate 324 (65 FE) MG TBEC Take 1 tablet by mouth daily.     hydrochlorothiazide (HYDRODIURIL) 25 MG tablet TAKE 1 TABLET (25 MG TOTAL) BY MOUTH DAILY. 90 tablet 3   omega-3 acid ethyl esters (LOVAZA) 1 g capsule Take by mouth daily.     PROLIA 60 MG/ML SOSY injection TO BE ADMINISTERED IN PHYSICIAN'S OFFICE. INJECT ONE SYRINGE SUBCUTANEOUSLY ONCE EVERY 6 MONTHS. REFRIGERATE. USE WITHIN 14 DAYS ONCE AT ROOM TEMPERATURE. 1 mL 0   triamcinolone (NASACORT) 55 MCG/ACT AERO nasal inhaler PLACE  2 SPRAYS IN EACH NOSTRIL ONCE A DAY FOR ALLERGIES 16.9 mL 5   amoxicillin (AMOXIL) 875 MG tablet Take 1 tablet (875 mg total) by mouth 2 (two) times daily. 20 tablet 0   COVID-19 mRNA bivalent vaccine, Pfizer, (PFIZER COVID-19 VAC BIVALENT) injection Inject into the muscle. 0.3 mL 0   COVID-19 mRNA vaccine 2023-2024 (COMIRNATY) SUSP injection Inject into the muscle. 0.3 mL 0   No facility-administered medications prior to visit.     Per HPI unless specifically indicated in ROS section below Review of Systems  Constitutional:  Negative for fatigue and fever.  HENT:  Negative for congestion.   Eyes:  Negative for pain.  Respiratory:  Negative for cough and shortness of breath.   Cardiovascular:  Negative for chest pain, palpitations and leg  swelling.  Gastrointestinal:  Positive for constipation. Negative for abdominal pain.  Genitourinary:  Negative for dysuria and vaginal bleeding.  Musculoskeletal:  Negative for back pain.  Neurological:  Negative for syncope, light-headedness and headaches.  Psychiatric/Behavioral:  Negative for dysphoric mood.    Objective:  BP 102/60   Pulse 96   Temp 98 F (36.7 C) (Oral)   Ht 5' 3.5" (1.613 m)   Wt 127 lb 8 oz (57.8 kg)   SpO2 98%   BMI 22.23 kg/m   Wt Readings from Last 3 Encounters:  07/16/22 127 lb 8 oz (57.8 kg)  01/08/22 129 lb 2 oz (58.6 kg)  07/11/21 128 lb 6 oz (58.2 kg)      Physical Exam Vitals and nursing note reviewed.  Constitutional:      General: She is not in acute distress.    Appearance: Normal appearance. She is well-developed. She is not ill-appearing or toxic-appearing.  HENT:     Head: Normocephalic.     Right Ear: Hearing, tympanic membrane, ear canal and external ear normal.     Left Ear: Hearing, tympanic membrane, ear canal and external ear normal.     Nose: Nose normal.  Eyes:     General: Lids are normal. Lids are everted, no foreign bodies appreciated.     Conjunctiva/sclera: Conjunctivae normal.     Pupils: Pupils are equal, round, and reactive to light.  Neck:     Thyroid: No thyroid mass or thyromegaly.     Vascular: No carotid bruit.     Trachea: Trachea normal.  Cardiovascular:     Rate and Rhythm: Normal rate and regular rhythm.     Heart sounds: Normal heart sounds, S1 normal and S2 normal. No murmur heard.    No gallop.  Pulmonary:     Effort: Pulmonary effort is normal. No respiratory distress.     Breath sounds: Normal breath sounds. No wheezing, rhonchi or rales.  Abdominal:     General: Bowel sounds are normal. There is no distension or abdominal bruit.     Palpations: Abdomen is soft. There is no fluid wave or mass.     Tenderness: There is no abdominal tenderness. There is no guarding or rebound.     Hernia: No hernia  is present.  Musculoskeletal:     Cervical back: Normal range of motion and neck supple.  Lymphadenopathy:     Cervical: No cervical adenopathy.  Skin:    General: Skin is warm and dry.     Findings: No rash.  Neurological:     Mental Status: She is alert.     Cranial Nerves: No cranial nerve deficit.     Sensory: No sensory  deficit.  Psychiatric:        Mood and Affect: Mood is not anxious or depressed.        Speech: Speech normal.        Behavior: Behavior normal. Behavior is cooperative.        Judgment: Judgment normal.       Results for orders placed or performed in visit on 07/10/22  VITAMIN D 25 Hydroxy (Vit-D Deficiency, Fractures)  Result Value Ref Range   VITD 59.47 30.00 - 100.00 ng/mL  CBC with Differential/Platelet  Result Value Ref Range   WBC 5.4 4.0 - 10.5 K/uL   RBC 5.39 (H) 3.87 - 5.11 Mil/uL   Hemoglobin 12.7 12.0 - 15.0 g/dL   HCT 39.7 36.0 - 46.0 %   MCV 73.6 (L) 78.0 - 100.0 fl   MCHC 31.9 30.0 - 36.0 g/dL   RDW 14.0 11.5 - 15.5 %   Platelets 181.0 150.0 - 400.0 K/uL   Neutrophils Relative % 51.9 43.0 - 77.0 %   Lymphocytes Relative 33.8 12.0 - 46.0 %   Monocytes Relative 9.1 3.0 - 12.0 %   Eosinophils Relative 4.5 0.0 - 5.0 %   Basophils Relative 0.7 0.0 - 3.0 %   Neutro Abs 2.8 1.4 - 7.7 K/uL   Lymphs Abs 1.8 0.7 - 4.0 K/uL   Monocytes Absolute 0.5 0.1 - 1.0 K/uL   Eosinophils Absolute 0.2 0.0 - 0.7 K/uL   Basophils Absolute 0.0 0.0 - 0.1 K/uL  Comprehensive metabolic panel  Result Value Ref Range   Sodium 138 135 - 145 mEq/L   Potassium 3.9 3.5 - 5.1 mEq/L   Chloride 100 96 - 112 mEq/L   CO2 32 19 - 32 mEq/L   Glucose, Bld 101 (H) 70 - 99 mg/dL   BUN 7 6 - 23 mg/dL   Creatinine, Ser 0.66 0.40 - 1.20 mg/dL   Total Bilirubin 1.1 0.2 - 1.2 mg/dL   Alkaline Phosphatase 63 39 - 117 U/L   AST 21 0 - 37 U/L   ALT 18 0 - 35 U/L   Total Protein 6.8 6.0 - 8.3 g/dL   Albumin 4.4 3.5 - 5.2 g/dL   GFR 87.13 >60.00 mL/min   Calcium 10.0 8.4 -  10.5 mg/dL  Lipid panel  Result Value Ref Range   Cholesterol 214 (H) 0 - 200 mg/dL   Triglycerides 68.0 0.0 - 149.0 mg/dL   HDL 93.80 >39.00 mg/dL   VLDL 13.6 0.0 - 40.0 mg/dL   LDL Cholesterol 106 (H) 0 - 99 mg/dL   Total CHOL/HDL Ratio 2    NonHDL 119.80   Hemoglobin A1c  Result Value Ref Range   Hgb A1c MFr Bld 5.9 4.6 - 6.5 %     COVID 19 screen:  No recent travel or known exposure to COVID19 The patient denies respiratory symptoms of COVID 19 at this time. The importance of social distancing was discussed today.   Assessment and Plan   The patient's preventative maintenance and recommended screening tests for an annual wellness exam were reviewed in full today. Brought up to date unless services declined.  Counselled on the importance of diet, exercise, and its role in overall health and mortality. The patient's FH and SH was reviewed, including their home life, tobacco status, and drug and alcohol status.   Mammogram  negative 06/02/2021.. due for re-eval Colonoscopy 2010, Dr. Deatra Ina, plans repeat in 2020.  Cologuard neg 2021 Last DEXA: 2021 improved T score in hip, slight  decrease in spine on prolia.  Due PAP/DVE: no pap indicated partial hystec, Vaccines:uptodate with PNA, flu given  today, completed Shingrix,  COVID x 4. Nonsmoker Hep C: done   Problem List Items Addressed This Visit     Anemia, iron deficiency   Benign essential HTN    Stable, chronic.  Continue current medication.  HCTZ 25 mg p.o. daily      Chronic constipation    Chronic, since childhood likely chronic idiopathic constipation. She has tried and failed multiple medications over the years including Colace, MiraLAX, increased water and fiber.  She has tried a family member's Linzess with no side effects and good results.  We will start a trial of Linzess 72 mcg daily.       High cholesterol    Chronic, increase in 10-year ASCVD risk score to 11.9%.  Statin indicated and discussed in detail  with patient. She will continue to work on low-cholesterol diet.  She will consider retesting cholesterol in 3 months.       Osteoporosis   Relevant Orders   DG Bone Density   Prediabetes    New diagnosis, likely due to increased candy lately.  She will get back on track with healthy eating, low-carb.  She does have a strong family history of diabetes.      Vitamin D deficiency    Chronic, resolved with supplementation      Other Visit Diagnoses     Routine general medical examination at a health care facility    -  Primary   Need for influenza vaccination       Relevant Orders   Flu Vaccine QUAD High Dose(Fluad) (Completed)      Meds ordered this encounter  Medications   linaclotide (LINZESS) 72 MCG capsule    Sig: Take 1 capsule (72 mcg total) by mouth daily before breakfast.    Dispense:  30 capsule    Refill:  11   Orders Placed This Encounter  Procedures   DG Bone Density    Standing Status:   Future    Standing Expiration Date:   07/17/2023    Order Specific Question:   Reason for Exam (SYMPTOM  OR DIAGNOSIS REQUIRED)    Answer:   osteoporosis    Order Specific Question:   Preferred imaging location?    Answer:   External    Comments:   [facility]   Flu Vaccine QUAD High Dose(Fluad)       Eliezer Lofts, MD

## 2022-07-16 NOTE — Assessment & Plan Note (Addendum)
Chronic, increase in 10-year ASCVD risk score to 11.9%.  Statin indicated and discussed in detail with patient. She will continue to work on low-cholesterol diet.  She will consider retesting cholesterol in 3 months.

## 2022-07-21 DIAGNOSIS — D2261 Melanocytic nevi of right upper limb, including shoulder: Secondary | ICD-10-CM | POA: Diagnosis not present

## 2022-07-21 DIAGNOSIS — L821 Other seborrheic keratosis: Secondary | ICD-10-CM | POA: Diagnosis not present

## 2022-07-21 DIAGNOSIS — D485 Neoplasm of uncertain behavior of skin: Secondary | ICD-10-CM | POA: Diagnosis not present

## 2022-09-20 ENCOUNTER — Other Ambulatory Visit: Payer: Self-pay | Admitting: Family Medicine

## 2022-09-24 DIAGNOSIS — Z780 Asymptomatic menopausal state: Secondary | ICD-10-CM | POA: Diagnosis not present

## 2022-09-24 DIAGNOSIS — M85851 Other specified disorders of bone density and structure, right thigh: Secondary | ICD-10-CM | POA: Diagnosis not present

## 2022-09-24 DIAGNOSIS — M810 Age-related osteoporosis without current pathological fracture: Secondary | ICD-10-CM | POA: Diagnosis not present

## 2022-09-24 DIAGNOSIS — M85852 Other specified disorders of bone density and structure, left thigh: Secondary | ICD-10-CM | POA: Diagnosis not present

## 2022-09-24 DIAGNOSIS — Z1231 Encounter for screening mammogram for malignant neoplasm of breast: Secondary | ICD-10-CM | POA: Diagnosis not present

## 2022-09-24 LAB — HM DEXA SCAN

## 2022-09-24 LAB — HM MAMMOGRAPHY

## 2022-09-25 ENCOUNTER — Encounter: Payer: Self-pay | Admitting: Family Medicine

## 2022-09-27 ENCOUNTER — Other Ambulatory Visit: Payer: Self-pay | Admitting: Family Medicine

## 2022-10-05 NOTE — Telephone Encounter (Signed)
Patient wanted to wait until Feb to schedule Prolia inj.  Will you please get a new PA for this year's ins benefits?  Thanks, Anda Kraft

## 2022-10-09 ENCOUNTER — Other Ambulatory Visit (HOSPITAL_COMMUNITY): Payer: Self-pay

## 2022-10-09 NOTE — Telephone Encounter (Signed)
Prolia VOB initiated via [email or web address] 

## 2022-10-19 ENCOUNTER — Other Ambulatory Visit (HOSPITAL_COMMUNITY): Payer: Self-pay

## 2022-10-19 NOTE — Telephone Encounter (Signed)
Called patient to sch appts.  Patient will call me back tomorrow after speaking to her dentist.  She had dental implants in November 2023, and is getting a bridge on 11/12/22.  She wants to make sure that she is ok to get the injection with her dentist first.

## 2022-10-19 NOTE — Telephone Encounter (Signed)
Pharmacy benefit is cheaper for the patient. Send rx to [facility]

## 2022-10-19 NOTE — Telephone Encounter (Signed)
Pt ready for scheduling on or after 10/19/22  Out-of-pocket cost due at time of visit: $327 (medical) / $135.00 (pharmacy)  Primary: Medicare Prolia co-insurance: 20% (approximately $302) Admin fee co-insurance: 20% (approximately $25)  Deductible: $91.58 met of $300 Required  Secondary:  Prolia co-insurance:  Admin fee co-insurance:   Deductible:   Prior Auth: Approved PA# Valid: 05/01/22-05/01/23  ** This summary of benefits is an estimation of the patient's out-of-pocket cost. Exact cost may vary based on individual plan coverage.

## 2022-10-19 NOTE — Telephone Encounter (Signed)
Pharmacy Patient Advocate Encounter  Insurance verification completed.    The patient is insured through [employer] for: Prolia 74m.  Pharmacy benefit copay: $135.00

## 2022-10-19 NOTE — Telephone Encounter (Signed)
PA approval effective from 05/01/22 til 05/01/23

## 2022-11-10 ENCOUNTER — Emergency Department (HOSPITAL_COMMUNITY): Payer: BC Managed Care – PPO

## 2022-11-10 ENCOUNTER — Other Ambulatory Visit: Payer: Self-pay

## 2022-11-10 ENCOUNTER — Telehealth: Payer: Self-pay | Admitting: Family Medicine

## 2022-11-10 ENCOUNTER — Emergency Department (HOSPITAL_COMMUNITY)
Admission: EM | Admit: 2022-11-10 | Discharge: 2022-11-11 | Disposition: A | Discharge status: Home / Self Care | Payer: BC Managed Care – PPO | Attending: Emergency Medicine | Admitting: Emergency Medicine

## 2022-11-10 ENCOUNTER — Encounter (HOSPITAL_COMMUNITY): Payer: Self-pay | Admitting: Emergency Medicine

## 2022-11-10 DIAGNOSIS — R42 Dizziness and giddiness: Secondary | ICD-10-CM | POA: Diagnosis not present

## 2022-11-10 DIAGNOSIS — H02401 Unspecified ptosis of right eyelid: Secondary | ICD-10-CM | POA: Insufficient documentation

## 2022-11-10 LAB — URINALYSIS, ROUTINE W REFLEX MICROSCOPIC
Bacteria, UA: NONE SEEN
Bilirubin Urine: NEGATIVE
Glucose, UA: NEGATIVE mg/dL
Hgb urine dipstick: NEGATIVE
Ketones, ur: NEGATIVE mg/dL
Nitrite: NEGATIVE
Protein, ur: NEGATIVE mg/dL
Specific Gravity, Urine: 1.011 (ref 1.005–1.030)
pH: 6 (ref 5.0–8.0)

## 2022-11-10 LAB — COMPREHENSIVE METABOLIC PANEL
ALT: 16 U/L (ref 0–44)
AST: 21 U/L (ref 15–41)
Albumin: 4.3 g/dL (ref 3.5–5.0)
Alkaline Phosphatase: 99 U/L (ref 38–126)
Anion gap: 7 (ref 5–15)
BUN: 8 mg/dL (ref 8–23)
CO2: 27 mmol/L (ref 22–32)
Calcium: 9.7 mg/dL (ref 8.9–10.3)
Chloride: 103 mmol/L (ref 98–111)
Creatinine, Ser: 0.66 mg/dL (ref 0.44–1.00)
GFR, Estimated: >60 mL/min (ref >60)
Glucose, Bld: 103 mg/dL — ABNORMAL HIGH (ref 70–99)
Potassium: 2.9 mmol/L — ABNORMAL LOW (ref 3.5–5.1)
Sodium: 137 mmol/L (ref 135–145)
Total Bilirubin: 1.1 mg/dL (ref 0.3–1.2)
Total Protein: 7 g/dL (ref 6.5–8.1)

## 2022-11-10 LAB — CBC WITH DIFFERENTIAL/PLATELET
Abs Immature Granulocytes: 0.01 10*3/uL (ref 0.00–0.07)
Basophils Absolute: 0 10*3/uL (ref 0.0–0.1)
Basophils Relative: 1 %
Eosinophils Absolute: 0.1 10*3/uL (ref 0.0–0.5)
Eosinophils Relative: 3 %
HCT: 40.5 % (ref 36.0–46.0)
Hemoglobin: 12.4 g/dL (ref 12.0–15.0)
Immature Granulocytes: 0 %
Lymphocytes Relative: 28 %
Lymphs Abs: 1.5 10*3/uL (ref 0.7–4.0)
MCH: 23.2 pg — ABNORMAL LOW (ref 26.0–34.0)
MCHC: 30.6 g/dL (ref 30.0–36.0)
MCV: 75.8 fL — ABNORMAL LOW (ref 80.0–100.0)
Monocytes Absolute: 0.4 10*3/uL (ref 0.1–1.0)
Monocytes Relative: 7 %
Neutro Abs: 3.2 10*3/uL (ref 1.7–7.7)
Neutrophils Relative %: 61 %
Platelets: 207 10*3/uL (ref 150–400)
RBC: 5.34 MIL/uL — ABNORMAL HIGH (ref 3.87–5.11)
RDW: 13.6 % (ref 11.5–15.5)
WBC: 5.3 10*3/uL (ref 4.0–10.5)
nRBC: 0 % (ref 0.0–0.2)

## 2022-11-10 NOTE — ED Notes (Signed)
Patient transported to MRI 

## 2022-11-10 NOTE — ED Provider Triage Note (Signed)
Emergency Medicine Provider Triage Evaluation Note  Michelle Gill , a 72 y.o. female  was evaluated in triage.  Pt complains of right eyelid droop starting around 1600 today.  Patient recently had dental procedure with implants today.  She reports that she felt skin on her eyelashes today which was abnormal.  She called her primary care doctor and dentist who told her to come in to the emergency department..  Review of Systems  Positive:  Negative:   Physical Exam  BP (!) 161/86 (BP Location: Left Arm)   Pulse 84   Temp 98 F (36.7 C) (Oral)   Resp 18   Ht 5' 5.5" (1.664 m)   Wt 59 kg   SpO2 100%   BMI 21.30 kg/m  Gen:   Awake, no distress   Resp:  Normal effort  MSK:   Moves extremities without difficulty  Other:  Strength is 5 of 5 in patient's upper and lower bilateral extremities.  Cranial nerves II through XII intact.  PERRLA.  EOMI.  Finger-nose-finger intact.    Medical Decision Making  Medically screening exam initiated at 6:12 PM.  Appropriate orders placed.  Michelle Gill was informed that the remainder of the evaluation will be completed by another provider, this initial triage assessment does not replace that evaluation, and the importance of remaining in the ED until their evaluation is complete.  My attending assessed at bedside.  No code stroke.  Patient elected to do MRI.   Sherrell Puller, [place] 11/10/22 [id number]

## 2022-11-10 NOTE — ED Notes (Signed)
Pt ambulatory to bathroom w/o assist

## 2022-11-10 NOTE — ED Notes (Signed)
Dr. Tyrone Nine at bedside. Patient adds she had dental work done around 2:30 today and had numbing shots.

## 2022-11-10 NOTE — Telephone Encounter (Signed)
FYI: This call has been transferred to Access Nurse. Once the result note has been entered staff can address the message at that time.  Patient called in with the following symptoms:  Red Word: right side eye lid dropping, had dental procedure to prepare gums for implants, also has a headache (from sinuses according to the patient)                   Pain medicine given at 2pm at the dentist office  Please advise at Mobile [phone] (mobile)  Message is routed to Provider Pool and [facility] Triage   **informed patient that most insurance companies do not cover dental issues in medical offices, but since there is not after hours for the dentist, sent to access nurse for medical advice

## 2022-11-10 NOTE — ED Provider Notes (Signed)
[place] EMERGENCY DEPARTMENT AT [facility] Provider Note   CSN: [id number] Arrival date & time: 11/10/22  1756     History  Chief Complaint  Patient presents with   Eye Droop    Michelle Gill is a 72 y.o. female.  72 yo  F with a chief complaints of right eyelid droop.  She noticed this about 4 PM today.  She called her doctor who was concerned if she had a stroke and told her to come to the ED for evaluation.  She denies any other symptoms.  She did have some dental work today that required numbing injections.  She denies head injury denies headache or neck pain.  Denies one-sided numbness or weakness denies difficulty speech or swallowing.        Home Medications Prior to Admission medications   Medication Sig Start Date End Date Taking? Authorizing Provider  albuterol (VENTOLIN HFA) 108 (90 Base) MCG/ACT inhaler TAKE 2 PUFFS BY MOUTH EVERY 4 HOURS AS NEEDED 09/27/22   Bedsole, Amy E, MD  cetirizine-pseudoephedrine (ZYRTEC-D) 5-120 MG tablet Take 1 tablet by mouth daily as needed for allergies.     [provider]  chlorhexidine (PERIDEX) 0.12 % solution SMARTSIG:By Mouth 07/08/22   [provider]  D3-50 1.25 MG (50000 UT) capsule TAKE 1 CAPSULE BY MOUTH ONE TIME PER WEEK 09/06/18   Bedsole, Amy E, MD  esomeprazole (NEXIUM) 40 MG capsule Take 40 mg by mouth as needed. For acid reflux    [provider]  ferrous sulfate 324 (65 FE) MG TBEC Take 1 tablet by mouth daily.    [provider]  hydrochlorothiazide (HYDRODIURIL) 25 MG tablet TAKE 1 TABLET (25 MG TOTAL) BY MOUTH DAILY. 09/20/22   Jinny Sanders, MD  linaclotide (LINZESS) 72 MCG capsule Take 1 capsule (72 mcg total) by mouth daily before breakfast. 07/16/22   Bedsole, Amy E, MD  omega-3 acid ethyl esters (LOVAZA) 1 g capsule Take by mouth daily.    [provider]  PROLIA 60 MG/ML SOSY injection TO BE ADMINISTERED IN PHYSICIAN'S OFFICE. INJECT ONE SYRINGE  SUBCUTANEOUSLY ONCE EVERY 6 MONTHS. REFRIGERATE. USE WITHIN 14 DAYS ONCE AT ROOM TEMPERATURE. 06/01/22   Bedsole, Amy E, MD  triamcinolone (NASACORT) 55 MCG/ACT AERO nasal inhaler PLACE 2 SPRAYS IN EACH NOSTRIL ONCE A DAY FOR ALLERGIES 03/05/15   Jinny Sanders, MD      Allergies    Almond (diagnostic), Morphine and related, and Naproxen    Review of Systems   Review of Systems  Physical Exam Updated Vital Signs BP 138/72   Pulse 80   Temp 98.3 F (36.8 C) (Oral)   Resp 16   Ht 5' 5.5" (1.664 m)   Wt 59 kg   SpO2 100%   BMI 21.30 kg/m  Physical Exam Vitals and nursing note reviewed.  Constitutional:      General: She is not in acute distress.    Appearance: She is well-developed. She is not diaphoretic.  HENT:     Head: Normocephalic and atraumatic.  Eyes:     Pupils: Pupils are equal, round, and reactive to light.      Comments: Area of patient's concern.  She has no obvious facial nerve palsy otherwise.  Cardiovascular:     Rate and Rhythm: Normal rate and regular rhythm.     Heart sounds: No murmur heard.    No friction rub. No gallop.  Pulmonary:     Effort: Pulmonary effort is  normal.     Breath sounds: No wheezing or rales.  Abdominal:     General: There is no distension.     Palpations: Abdomen is soft.     Tenderness: There is no abdominal tenderness.  Musculoskeletal:        General: No tenderness.     Cervical back: Normal range of motion and neck supple.  Skin:    General: Skin is warm and dry.  Neurological:     Mental Status: She is alert and oriented to person, place, and time.     Cranial Nerves: Cranial nerves 2-12 are intact.     Sensory: Sensation is intact.     Motor: Motor function is intact.     Coordination: Coordination is intact.  Psychiatric:        Behavior: Behavior normal.     ED Results / Procedures / Treatments   Labs (all labs ordered are listed, but only abnormal results are displayed) Labs Reviewed  CBC WITH  DIFFERENTIAL/PLATELET - Abnormal; Notable for the following components:      Result Value   RBC 5.34 (*)    MCV 75.8 (*)    MCH 23.2 (*)    All other components within normal limits  COMPREHENSIVE METABOLIC PANEL - Abnormal; Notable for the following components:   Potassium 2.9 (*)    Glucose, Bld 103 (*)    All other components within normal limits  URINALYSIS, ROUTINE W REFLEX MICROSCOPIC - Abnormal; Notable for the following components:   Leukocytes,Ua SMALL (*)    All other components within normal limits    EKG None  Radiology MR BRAIN WO CONTRAST  Result Date: 11/10/2022 CLINICAL DATA:  Right eye drooping EXAM: MRI HEAD WITHOUT CONTRAST TECHNIQUE: Multiplanar, multiecho pulse sequences of the brain and surrounding structures were obtained without intravenous contrast. COMPARISON:  No prior MRI available FINDINGS: Brain: No restricted diffusion to suggest acute or subacute infarct. No acute hemorrhage, mass, mass effect, or midline shift. No hydrocephalus or extra-axial collection. Normal pituitary and craniocervical junction. Cerebral volume is normal for age. Scattered T2 hyperintense signal in the periventricular white matter, likely the sequela of minimal chronic small vessel ischemic disease. Vascular: Normal arterial flow voids. Skull and upper cervical spine: Normal marrow signal. Sinuses/Orbits: Mild mucosal thickening in the ethmoid air cells. No acute finding in the orbits. No acute finding in the orbits. Other: The mastoid air cells are well aerated. IMPRESSION: No acute intracranial process. No evidence of acute or subacute infarct. Electronically Signed   By: Merilyn Baba M.D.   On: 11/10/2022 23:44    Procedures Procedures    Medications Ordered in ED Medications - No data to display  ED Course/ Medical Decision Making/ A&P                             Medical Decision Making  72 yo F with a chief complaint of right eyelid drooping.  Is very subtle on my exam.   She otherwise is neurovascularly intact.  I feel like her NIHSS is score would be 0.  Will hold off on activating code stroke.  Discussed risk and benefits of MRI.  Patient is electing.  MRI negative.  No significant electrolyte abnormalities.  No significant anemia.  Will discharge home.  PCP follow-up.  11:48 PM:  I have discussed the diagnosis/risks/treatment options with the patient.  Evaluation and diagnostic testing in the emergency department does not suggest an  emergent condition requiring admission or immediate intervention beyond what has been performed at this time.  They will follow up with neuro. We also discussed returning to the ED immediately if new or worsening sx occur. We discussed the sx which are most concerning (e.g., sudden worsening pain, fever, inability to tolerate by mouth) that necessitate immediate return. Medications administered to the patient during their visit and any new prescriptions provided to the patient are listed below.  Medications given during this visit Medications - No data to display   The patient appears reasonably screen and/or stabilized for discharge and I doubt any other medical condition or other [facility] requiring further screening, evaluation, or treatment in the ED at this time prior to discharge.          Final Clinical Impression(s) / ED Diagnoses Final diagnoses:  Ptosis of right eyelid    Rx / DC Orders ED Discharge Orders          Ordered    Ambulatory referral to Neurology       Comments: New eyelid droop?   11/10/22 Livingston, Leary, DO 11/10/22 2348

## 2022-11-10 NOTE — ED Triage Notes (Signed)
Patient arrives ambulatory by POV states she felt something touch her right eyelash then went into bathroom and noticed right eye dropping at about 4pm today. PA at bedside assessing patient.

## 2022-11-10 NOTE — Discharge Instructions (Signed)
Follow up with your family doc in the office. I have referred you to a neurologist to evaluated you in the office.  They should give you a call to set up an appointment.

## 2022-11-11 NOTE — Telephone Encounter (Signed)
Noted  

## 2022-11-11 NOTE — Telephone Encounter (Signed)
[place] [facility] - Client TELEPHONE ADVICE RECORD [facility] Patient Name: Michelle Gill Gender: Female DOB: 02/11/1951 Age: 72 Y 62 M Return Phone Number: [phone] (Primary), [phone] (Secondary) Address: City/ State/ Zip: [place] [address] Client [facility] - Client Client Site [place] - Day Provider Eliezer Lofts - MD Contact Type Call Who Is Calling Patient / Member / Family / Caregiver Call Type Triage / Clinical Relationship To Patient Self Return Phone Number [phone] (Primary) Chief Complaint Medication reaction Reason for Call Symptomatic / Request for [place] states patient is having right side eye lid drooping after having pain injections at dentist visit today. She also has a headache. No numbness or tingling. Translation No Nurse Assessment Nurse: Nicki Reaper, RN, Malachy Mood Date/Time (Eastern Time): 11/10/2022 4:56:40 PM Confirm and document reason for call. If symptomatic, describe symptoms. ---Caller states she her R eye lid is drooping down where it touches her lashes, after having a injection at dentist today, HA all day due to sinuses, rates pain 4-5/10, denies eye pain and other symptoms Does the patient have any new or worsening symptoms? ---Yes Will a triage be completed? ---Yes Related visit to physician within the last 2 weeks? ---Yes Does the PT have any chronic conditions? (i.e. diabetes, asthma, this includes High risk factors for pregnancy, etc.) ---Yes List chronic conditions. ---HTN stress induced Is this a behavioral health or substance abuse call? ---No Guidelines Guideline Title Affirmed Question Affirmed Notes Nurse Date/Time Eilene Ghazi Time) Neurologic Deficit Headache (and neurologic deficit) Nicki Reaper, RN, Malachy Mood 11/10/2022 5:00:43 PM Disp. Time Eilene Ghazi Time) Disposition Final User 11/10/2022 5:02:29 PM Go to ED Now  Yes Nicki Reaper, RN, Malachy Mood PLEASE NOTE: All timestamps contained within this report are represented as [place] Standard Time. CONFIDENTIALTY NOTICE: This fax transmission is intended only for the addressee. It contains information that is legally privileged, confidential or otherwise protected from use or disclosure. If you are not the intended recipient, you are strictly prohibited from reviewing, disclosing, copying using or disseminating any of this information or taking any action in reliance on or regarding this information. If you have received this fax in error, please notify us immediately by telephone so that we can arrange for its return to [place]. Phone: [phone], Toll-Free: [phone], Fax: [phone] Page: 2 of 2 Call Id: [id number] Final Disposition 11/10/2022 5:02:29 PM Go to ED Now Yes Nicki Reaper, RN, Erskine Speed Disagree/Comply Comply Caller Understands Yes PreDisposition Call Doctor Care Advice Given Per Guideline GO TO ED NOW: * You need to be seen in the Emergency Department. * Another adult should drive. Referrals [facility] - [facility]

## 2022-11-11 NOTE — Telephone Encounter (Signed)
Per chart review pt was seen at [facility] ED on 11/10/22. Sending note to Dr Diona Browner and [place] pool.

## 2022-11-12 DIAGNOSIS — H25813 Combined forms of age-related cataract, bilateral: Secondary | ICD-10-CM | POA: Diagnosis not present

## 2022-11-13 ENCOUNTER — Telehealth: Payer: Self-pay

## 2022-11-13 NOTE — Transitions of Care (Post Inpatient/ED Visit) (Signed)
   11/13/2022  Name: Michelle Gill MRN: [id number] DOB: 1950/12/18  Today's TOC FU Call Status: Today's TOC FU Call Status:: Successful TOC FU Call Competed TOC FU Call Complete Date: 11/13/22  Transition Care Management Follow-up Telephone Call Date of Discharge: 11/11/22 Discharge Facility: [facility]) Type of Discharge: Emergency Department Reason for ED Visit: Other: ('ptosis of right eyelid") How have you been since you were released from the hospital?: Better (patient voices that her eye has almost returned to normal) Any questions or concerns?: No Red on EMMI-ED Discharge Alert Date & Reason:  11/12/22"Scheduled follow-up appt? No"-Addressed and reviewed red alert. Patient completed follow up appt with eye MD yesterday.  Items Reviewed: Did you receive and understand the discharge instructions provided?: Yes Medications obtained and verified?: Yes (Medications Reviewed) Any new allergies since your discharge?: No Dietary orders reviewed?: NA Do you have support at home?: Yes People in Home: spouse Name of Support/Comfort Primary Source: [facility] and Equipment/Supplies: [place] Ordered?: NA Any new equipment or medical supplies ordered?: NA  Functional Questionnaire: Do you need assistance with bathing/showering or dressing?: No Do you need assistance with meal preparation?: No Do you need assistance with eating?: No Do you have difficulty maintaining continence: No Do you need assistance with getting out of bed/getting out of a chair/moving?: No Do you have difficulty managing or taking your medications?: No  Folllow up appointments reviewed: PCP Follow-up appointment confirmed?: NA Specialist Hospital Follow-up appointment confirmed?: Yes Date of Specialist follow-up appointment?: 11/12/22 Follow-Up Specialty Provider:: [place] Do you need transportation to your follow-up appointment?: No Do you understand care options  if your condition(s) worsen?: Yes-patient verbalized understanding  SDOH Interventions Today    Flowsheet Row Most Recent Value  SDOH Interventions   Food Insecurity Interventions Intervention Not Indicated  Transportation Interventions Intervention Not Indicated      TOC Interventions Today    Flowsheet Row Most Recent Value  TOC Interventions   TOC Interventions Discussed/Reviewed TOC Interventions Discussed      Interventions Today    Flowsheet Row Most Recent Value  Education Interventions   Education Provided Provided Education  Provided Verbal Education On Nutrition, When to see the doctor  Pharmacy Interventions   Pharmacy Dicussed/Reviewed Pharmacy Topics Discussed, Medications and their functions  Safety Interventions   Safety Discussed/Reviewed Safety Discussed       Hetty Blend [facility] Care Management Care Management Community Coordinator Direct Phone: [phone] Toll Free: [phone] Fax: [phone]

## 2022-11-24 ENCOUNTER — Other Ambulatory Visit (HOSPITAL_COMMUNITY): Payer: Self-pay

## 2022-12-22 ENCOUNTER — Ambulatory Visit: Payer: BC Managed Care – PPO | Admitting: Family Medicine

## 2022-12-22 VITALS — BP 100/66 | HR 92 | Temp 97.7°F | Ht 63.5 in | Wt 128.0 lb

## 2022-12-22 DIAGNOSIS — E876 Hypokalemia: Secondary | ICD-10-CM | POA: Insufficient documentation

## 2022-12-22 DIAGNOSIS — R531 Weakness: Secondary | ICD-10-CM | POA: Diagnosis not present

## 2022-12-22 DIAGNOSIS — R197 Diarrhea, unspecified: Secondary | ICD-10-CM | POA: Insufficient documentation

## 2022-12-22 LAB — COMPREHENSIVE METABOLIC PANEL
ALT: 16 U/L (ref 0–35)
AST: 18 U/L (ref 0–37)
Albumin: 4.4 g/dL (ref 3.5–5.2)
Alkaline Phosphatase: 118 U/L — ABNORMAL HIGH (ref 39–117)
BUN: 8 mg/dL (ref 6–23)
CO2: 30 mEq/L (ref 19–32)
Calcium: 10.3 mg/dL (ref 8.4–10.5)
Chloride: 99 mEq/L (ref 96–112)
Creatinine, Ser: 0.6 mg/dL (ref 0.40–1.20)
GFR: 88.87 mL/min (ref >60.00)
Glucose, Bld: 84 mg/dL (ref 70–99)
Potassium: 3.8 mEq/L (ref 3.5–5.1)
Sodium: 136 mEq/L (ref 135–145)
Total Bilirubin: 0.9 mg/dL (ref 0.2–1.2)
Total Protein: 6.9 g/dL (ref 6.0–8.3)

## 2022-12-22 LAB — CBC WITH DIFFERENTIAL/PLATELET
Basophils Absolute: 0 10*3/uL (ref 0.0–0.1)
Basophils Relative: 0.4 % (ref 0.0–3.0)
Eosinophils Absolute: 0.1 10*3/uL (ref 0.0–0.7)
Eosinophils Relative: 1.9 % (ref 0.0–5.0)
HCT: 39.8 % (ref 36.0–46.0)
Hemoglobin: 12.7 g/dL (ref 12.0–15.0)
Lymphocytes Relative: 22.2 % (ref 12.0–46.0)
Lymphs Abs: 1.3 10*3/uL (ref 0.7–4.0)
MCHC: 32 g/dL (ref 30.0–36.0)
MCV: 73.6 fl — ABNORMAL LOW (ref 78.0–100.0)
Monocytes Absolute: 0.7 10*3/uL (ref 0.1–1.0)
Monocytes Relative: 12.7 % — ABNORMAL HIGH (ref 3.0–12.0)
Neutro Abs: 3.6 10*3/uL (ref 1.4–7.7)
Neutrophils Relative %: 62.8 % (ref 43.0–77.0)
Platelets: 208 10*3/uL (ref 150.0–400.0)
RBC: 5.41 Mil/uL — ABNORMAL HIGH (ref 3.87–5.11)
RDW: 14 % (ref 11.5–15.5)
WBC: 5.7 10*3/uL (ref 4.0–10.5)

## 2022-12-22 NOTE — Patient Instructions (Signed)
Rest, push fluids, continue Pedialyte but return to normal diet if able.

## 2022-12-22 NOTE — Assessment & Plan Note (Signed)
Acute, most likely viral gastroenteritis versus possible bacterial gastroenteritis.  Stool has solidified some but she continues to feel weak and have increased bowel sounds. Given the stool more solidified we will hold off on stool collection for evaluation of infectious cause. If symptoms persistent consider C. difficile and stool pathogen panel.  She typically has constipation and her symptoms may correspond to irritable bowel syndrome diarrhea/pain given significant release with passing of gas and bowel movement. If symptoms are persistent consider this as a diagnosis.

## 2022-12-22 NOTE — Assessment & Plan Note (Signed)
Acute, noted at recent ED visit but not treated at that time per patient and per reviewed report. Will reevaluate today and replete if needed.

## 2022-12-22 NOTE — Progress Notes (Signed)
Patient ID: Michelle Gill, female    DOB: 11-Jun-1951, 72 y.o.   MRN: [id number]  This visit was conducted in person.  BP 100/66   Pulse 92   Temp 97.7 F (36.5 C) (Temporal)   Ht 5' 3.5" (1.613 m)   Wt 128 lb (58.1 kg)   SpO2 100%   BMI 22.32 kg/m    CC:  Chief Complaint  Patient presents with   Diarrhea    > 1 week Negative Covid Test on Sunday   Fatigue   Memory Concerns    Subjective:   HPI: Michelle Gill is a 72 y.o. female presenting on 12/22/2022 for Diarrhea (> 1 week/Negative Covid Test on Sunday), Fatigue, and Memory Concerns  She reports new onset diarrhea and fatigue ongoing for approximately 1 week. She reports initial constipation ( had not been taking linzess... suddenly  changed to diarrhea, abdominal cramps.  Watery BMs 5 times daily over the weekend.  Now very small BM, minimal output, nuggets, terrible odor, " bubbling" in stomach. She has been feeling weak and tired.  She has been taking pedialyte ( maybe 8 oz a day), eating minimally.  Drinking  2 x12 oz bottle water each day  Some nausea, no emesis.  No blood in stool.  No fever, no chills.   No sick contacts.  No new meds, no well water, no travel.   Negative COVID test at home. No cough , no congestions.  BP Readings from Last 3 Encounters:  12/22/22 100/66  11/10/22 138/72  07/16/22 102/60    Wt Readings from Last 3 Encounters:  12/22/22 128 lb (58.1 kg)  11/10/22 130 lb (59 kg)  07/16/22 127 lb 8 oz (57.8 kg)     Was in ER for ptosis right eyelid... after dental treatment... resolved in several hours.  K was 2.9  MRI negative      Relevant past medical, surgical, family and social history reviewed and updated as indicated. Interim medical history since our last visit reviewed. Allergies and medications reviewed and updated. Outpatient Medications Prior to Visit  Medication Sig Dispense Refill   albuterol (VENTOLIN HFA) 108 (90 Base) MCG/ACT inhaler TAKE 2 PUFFS BY  MOUTH EVERY 4 HOURS AS NEEDED 18 each 2   cetirizine-pseudoephedrine (ZYRTEC-D) 5-120 MG tablet Take 1 tablet by mouth daily as needed for allergies.      chlorhexidine (PERIDEX) 0.12 % solution SMARTSIG:By Mouth     D3-50 1.25 MG (50000 UT) capsule TAKE 1 CAPSULE BY MOUTH ONE TIME PER WEEK 12 capsule 0   esomeprazole (NEXIUM) 40 MG capsule Take 40 mg by mouth as needed. For acid reflux     ferrous sulfate 324 (65 FE) MG TBEC Take 1 tablet by mouth daily.     hydrochlorothiazide (HYDRODIURIL) 25 MG tablet TAKE 1 TABLET (25 MG TOTAL) BY MOUTH DAILY. 90 tablet 3   linaclotide (LINZESS) 72 MCG capsule Take 1 capsule (72 mcg total) by mouth daily before breakfast. 30 capsule 11   omega-3 acid ethyl esters (LOVAZA) 1 g capsule Take by mouth daily.     PROLIA 60 MG/ML SOSY injection TO BE ADMINISTERED IN PHYSICIAN'S OFFICE. INJECT ONE SYRINGE SUBCUTANEOUSLY ONCE EVERY 6 MONTHS. REFRIGERATE. USE WITHIN 14 DAYS ONCE AT ROOM TEMPERATURE. 1 mL 0   triamcinolone (NASACORT) 55 MCG/ACT AERO nasal inhaler PLACE 2 SPRAYS IN EACH NOSTRIL ONCE A DAY FOR ALLERGIES 16.9 mL 5   No facility-administered medications prior to visit.     Per HPI unless  specifically indicated in ROS section below Review of Systems  Constitutional:  Negative for fatigue and fever.  HENT:  Negative for congestion.   Eyes:  Negative for pain.  Respiratory:  Negative for cough and shortness of breath.   Cardiovascular:  Negative for chest pain, palpitations and leg swelling.  Gastrointestinal:  Positive for diarrhea and nausea. Negative for abdominal pain.       Increase rumbling in abdomen  Genitourinary:  Negative for dysuria and vaginal bleeding.  Musculoskeletal:  Negative for back pain.  Neurological:  Negative for syncope, light-headedness and headaches.  Psychiatric/Behavioral:  Negative for dysphoric mood.    Objective:  BP 100/66   Pulse 92   Temp 97.7 F (36.5 C) (Temporal)   Ht 5' 3.5" (1.613 m)   Wt 128 lb (58.1  kg)   SpO2 100%   BMI 22.32 kg/m   Wt Readings from Last 3 Encounters:  12/22/22 128 lb (58.1 kg)  11/10/22 130 lb (59 kg)  07/16/22 127 lb 8 oz (57.8 kg)      Physical Exam Constitutional:      General: She is not in acute distress.    Appearance: Normal appearance. She is well-developed. She is not ill-appearing or toxic-appearing.     Comments: Fatigued appearing  HENT:     Head: Normocephalic.     Right Ear: Hearing, tympanic membrane, ear canal and external ear normal. Tympanic membrane is not erythematous, retracted or bulging.     Left Ear: Hearing, tympanic membrane, ear canal and external ear normal. Tympanic membrane is not erythematous, retracted or bulging.     Nose: No mucosal edema or rhinorrhea.     Right Sinus: No maxillary sinus tenderness or frontal sinus tenderness.     Left Sinus: No maxillary sinus tenderness or frontal sinus tenderness.     Mouth/Throat:     Pharynx: Uvula midline.  Eyes:     General: Lids are normal. Lids are everted, no foreign bodies appreciated.     Conjunctiva/sclera: Conjunctivae normal.     Pupils: Pupils are equal, round, and reactive to light.  Neck:     Thyroid: No thyroid mass or thyromegaly.     Vascular: No carotid bruit.     Trachea: Trachea normal.  Cardiovascular:     Rate and Rhythm: Normal rate and regular rhythm.     Pulses: Normal pulses.     Heart sounds: Normal heart sounds, S1 normal and S2 normal. No murmur heard.    No friction rub. No gallop.  Pulmonary:     Effort: Pulmonary effort is normal. No tachypnea or respiratory distress.     Breath sounds: Normal breath sounds. No decreased breath sounds, wheezing, rhonchi or rales.  Abdominal:     General: Bowel sounds are increased.     Palpations: Abdomen is soft.     Tenderness: There is no abdominal tenderness. There is no right CVA tenderness or left CVA tenderness.  Musculoskeletal:     Cervical back: Normal range of motion and neck supple.  Skin:     General: Skin is warm and dry.     Findings: No rash.  Neurological:     Mental Status: She is alert.  Psychiatric:        Mood and Affect: Mood is not anxious or depressed.        Speech: Speech normal.        Behavior: Behavior normal. Behavior is cooperative.        Thought Content:  Thought content normal.        Judgment: Judgment normal.       Results for orders placed or performed during the hospital encounter of 11/10/22  CBC with Differential  Result Value Ref Range   WBC 5.3 4.0 - 10.5 K/uL   RBC 5.34 (H) 3.87 - 5.11 MIL/uL   Hemoglobin 12.4 12.0 - 15.0 g/dL   HCT 16.1 09.6 - 04.5 %   MCV 75.8 (L) 80.0 - 100.0 fL   MCH 23.2 (L) 26.0 - 34.0 pg   MCHC 30.6 30.0 - 36.0 g/dL   RDW 40.9 81.1 - 91.4 %   Platelets 207 150 - 400 K/uL   nRBC 0.0 0.0 - 0.2 %   Neutrophils Relative % 61 %   Neutro Abs 3.2 1.7 - 7.7 K/uL   Lymphocytes Relative 28 %   Lymphs Abs 1.5 0.7 - 4.0 K/uL   Monocytes Relative 7 %   Monocytes Absolute 0.4 0.1 - 1.0 K/uL   Eosinophils Relative 3 %   Eosinophils Absolute 0.1 0.0 - 0.5 K/uL   Basophils Relative 1 %   Basophils Absolute 0.0 0.0 - 0.1 K/uL   Immature Granulocytes 0 %   Abs Immature Granulocytes 0.01 0.00 - 0.07 K/uL  Comprehensive metabolic panel  Result Value Ref Range   Sodium 137 135 - 145 mmol/L   Potassium 2.9 (L) 3.5 - 5.1 mmol/L   Chloride 103 98 - 111 mmol/L   CO2 27 22 - 32 mmol/L   Glucose, Bld 103 (H) 70 - 99 mg/dL   BUN 8 8 - 23 mg/dL   Creatinine, Ser 7.82 0.44 - 1.00 mg/dL   Calcium 9.7 8.9 - 95.6 mg/dL   Total Protein 7.0 6.5 - 8.1 g/dL   Albumin 4.3 3.5 - 5.0 g/dL   AST 21 15 - 41 U/L   ALT 16 0 - 44 U/L   Alkaline Phosphatase 99 38 - 126 U/L   Total Bilirubin 1.1 0.3 - 1.2 mg/dL   GFR, Estimated >21 >30 mL/min   Anion gap 7 5 - 15  Urinalysis, Routine w reflex microscopic -Urine, Clean Catch  Result Value Ref Range   Color, Urine YELLOW YELLOW   APPearance CLEAR CLEAR   Specific Gravity, Urine 1.011 1.005 -  1.030   pH 6.0 5.0 - 8.0   Glucose, UA NEGATIVE NEGATIVE mg/dL   Hgb urine dipstick NEGATIVE NEGATIVE   Bilirubin Urine NEGATIVE NEGATIVE   Ketones, ur NEGATIVE NEGATIVE mg/dL   Protein, ur NEGATIVE NEGATIVE mg/dL   Nitrite NEGATIVE NEGATIVE   Leukocytes,Ua SMALL (A) NEGATIVE   RBC / HPF 0-5 0 - 5 RBC/hpf   WBC, UA 0-5 0 - 5 WBC/hpf   Bacteria, UA NONE SEEN NONE SEEN   Squamous Epithelial / HPF 0-5 0 - 5 /HPF   Mucus PRESENT     Assessment and Plan  There are no diagnoses linked to this encounter.  No follow-ups on file.   Kerby Nora, MD

## 2022-12-22 NOTE — Assessment & Plan Note (Signed)
Acute, likely secondary to electrolyte depletion and mild dehydration from diarrhea and poor p.o. intake.  Will evaluate with labs.

## 2022-12-25 ENCOUNTER — Ambulatory Visit: Payer: BC Managed Care – PPO | Admitting: Family Medicine

## 2022-12-25 ENCOUNTER — Encounter: Payer: Self-pay | Admitting: Family Medicine

## 2022-12-25 VITALS — BP 100/60 | HR 60 | Temp 98.2°F | Ht 63.5 in | Wt 128.4 lb

## 2022-12-25 DIAGNOSIS — H02401 Unspecified ptosis of right eyelid: Secondary | ICD-10-CM | POA: Diagnosis not present

## 2022-12-25 NOTE — Assessment & Plan Note (Signed)
Acute, recurrent Initially thought this was secondary to dental procedure and extra dosing of anesthetic given incorrectly. Now that it is returned after episode of reading for a while I do have some concerns that this could be a muscular disorder.  She has no other clear symptoms of early muscle fatigue but issue leg myasthenia gravis is a possibility. Recent MRI showed no clear masses or stroke.  Her symptoms or not clearly consistent with stroke.  Not clearly Bell's pasly but she did likely have recent viral infection. Cranial nerves grossly intact including 3 and 7 given normal extraocular muscles normal pupils. Given there is some slight erythema I wonder if some of her symptoms could be related to sinus pressure and allergies.  She will use an antihistamine and add Nasacort 2 sprays per nostril daily. She will call to schedule an appoint with neurology for further evaluation.  If she has difficulty doing this she will contact me for me to place an additional referral.  Return and ER precautions provided

## 2022-12-25 NOTE — Progress Notes (Signed)
Patient ID: Michelle Gill, female    DOB: [birth year], 72 y.o.   MRN: [id number]  This visit was conducted in person.  BP 100/60   Pulse 60   Temp 98.2 F (36.8 C) (Temporal)   Ht 5' 3.5" (1.613 m)   Wt 128 lb 6 oz (58.2 kg)   SpO2 100%   BMI 22.38 kg/m    CC:  Chief Complaint  Patient presents with   Eyebrow Drooping    Right    Subjective:   HPI: Michelle Gill is a 72 y.o. female presenting on 12/25/2022 for Eyebrow Drooping (Right)  Recently seen on April 16 with acute diarrhea Normal electrolytes, liver and kidney function, hemoglobin 12.7  Also seen in the emergency room on November 10, 2022 for drooping of the right eyelid after the injections of numbing med at dentist.  At that point lab work and urinalysis were normal except potassium was 2.9. MRI of the brain was within normal limits, no evidence of acute or subacute infarct EKG unremarkable   She reports  diarrhea resolved.. now constipation  2 days ago. Last night while reading.. she noted weakness in right eyelid.  Gradually improved  Then recurred in AM.. has noted some improvement today.  No pain, no obvious swelling.   No tooth  pain,  no gum swelling, no oral swelling. No fever.    Relevant past medical, surgical, family and social history reviewed and updated as indicated. Interim medical history since our last visit reviewed. Allergies and medications reviewed and updated. Outpatient Medications Prior to Visit  Medication Sig Dispense Refill   albuterol (VENTOLIN HFA) 108 (90 Base) MCG/ACT inhaler TAKE 2 PUFFS BY MOUTH EVERY 4 HOURS AS NEEDED 18 each 2   b complex vitamins capsule Take 1 capsule by mouth daily.     cetirizine-pseudoephedrine (ZYRTEC-D) 5-120 MG tablet Take 1 tablet by mouth daily as needed for allergies.      chlorhexidine (PERIDEX) 0.12 % solution SMARTSIG:By Mouth     D3-50 1.25 MG (50000 UT) capsule TAKE 1 CAPSULE BY MOUTH ONE TIME PER WEEK 12 capsule 0   esomeprazole  (NEXIUM) 40 MG capsule Take 40 mg by mouth as needed. For acid reflux     ferrous sulfate 324 (65 FE) MG TBEC Take 1 tablet by mouth daily.     hydrochlorothiazide (HYDRODIURIL) 25 MG tablet TAKE 1 TABLET (25 MG TOTAL) BY MOUTH DAILY. 90 tablet 3   linaclotide (LINZESS) 72 MCG capsule Take 1 capsule (72 mcg total) by mouth daily before breakfast. 30 capsule 11   omega-3 acid ethyl esters (LOVAZA) 1 g capsule Take by mouth daily.     PROLIA 60 MG/ML SOSY injection TO BE ADMINISTERED IN PHYSICIAN'S OFFICE. INJECT ONE SYRINGE SUBCUTANEOUSLY ONCE EVERY 6 MONTHS. REFRIGERATE. USE WITHIN 14 DAYS ONCE AT ROOM TEMPERATURE. 1 mL 0   triamcinolone (NASACORT) 55 MCG/ACT AERO nasal inhaler PLACE 2 SPRAYS IN EACH NOSTRIL ONCE A DAY FOR ALLERGIES 16.9 mL 5   No facility-administered medications prior to visit.     Per HPI unless specifically indicated in ROS section below Review of Systems  Constitutional:  Negative for fatigue and fever.  HENT:  Negative for congestion.   Eyes:  Negative for pain.  Respiratory:  Negative for cough and shortness of breath.   Cardiovascular:  Negative for chest pain, palpitations and leg swelling.  Gastrointestinal:  Negative for abdominal pain.  Genitourinary:  Negative for dysuria and vaginal bleeding.  Musculoskeletal:  Negative for  back pain.  Neurological:  Negative for syncope, light-headedness and headaches.  Psychiatric/Behavioral:  Negative for dysphoric mood.    Objective:  BP 100/60   Pulse 60   Temp 98.2 F (36.8 C) (Temporal)   Ht 5' 3.5" (1.613 m)   Wt 128 lb 6 oz (58.2 kg)   SpO2 100%   BMI 22.38 kg/m   Wt Readings from Last 3 Encounters:  12/25/22 128 lb 6 oz (58.2 kg)  12/22/22 128 lb (58.1 kg)  11/10/22 130 lb (59 kg)      Physical Exam Constitutional:      General: She is not in acute distress.    Appearance: Normal appearance. She is well-developed. She is not ill-appearing or toxic-appearing.  HENT:     Head: Normocephalic.      Right Ear: Hearing, tympanic membrane, ear canal and external ear normal. Tympanic membrane is not erythematous, retracted or bulging.     Left Ear: Hearing, tympanic membrane, ear canal and external ear normal. Tympanic membrane is not erythematous, retracted or bulging.     Nose: No mucosal edema or rhinorrhea.     Right Sinus: No maxillary sinus tenderness or frontal sinus tenderness.     Left Sinus: No maxillary sinus tenderness or frontal sinus tenderness.     Mouth/Throat:     Pharynx: Uvula midline.  Eyes:     General: Lids are everted, no foreign bodies appreciated.        Right eye: No foreign body, discharge or hordeolum.        Left eye: No foreign body, discharge or hordeolum.     Extraocular Movements: Extraocular movements intact.     Conjunctiva/sclera: Conjunctivae normal.     Pupils: Pupils are equal, round, and reactive to light.     Comments: Right eyelid is slightly swollen and faint erythema, right eyelid drooping  Neck:     Thyroid: No thyroid mass or thyromegaly.     Vascular: No carotid bruit.     Trachea: Trachea normal.  Cardiovascular:     Rate and Rhythm: Normal rate and regular rhythm.     Pulses: Normal pulses.     Heart sounds: Normal heart sounds, S1 normal and S2 normal. No murmur heard.    No friction rub. No gallop.  Pulmonary:     Effort: Pulmonary effort is normal. No tachypnea or respiratory distress.     Breath sounds: Normal breath sounds. No decreased breath sounds, wheezing, rhonchi or rales.  Abdominal:     General: Bowel sounds are normal.     Palpations: Abdomen is soft.     Tenderness: There is no abdominal tenderness.  Musculoskeletal:     Cervical back: Normal range of motion and neck supple.  Skin:    General: Skin is warm and dry.     Findings: No rash.  Neurological:     Mental Status: She is alert and oriented to person, place, and time.     GCS: GCS eye subscore is 4. GCS verbal subscore is 5. GCS motor subscore is 6.      Cranial Nerves: No cranial nerve deficit.     Sensory: No sensory deficit.     Motor: No abnormal muscle tone.     Coordination: Coordination normal.     Gait: Gait normal.     Deep Tendon Reflexes: Reflexes are normal and symmetric.     Comments: Nml cerebellar exam   No papilledema  Psychiatric:  Mood and Affect: Mood is not anxious or depressed.        Speech: Speech normal.        Behavior: Behavior normal. Behavior is cooperative.        Thought Content: Thought content normal.        Cognition and Memory: Memory is not impaired. She does not exhibit impaired recent memory or impaired remote memory.        Judgment: Judgment normal.       Results for orders placed or performed in visit on 12/22/22  CBC with Differential/Platelet  Result Value Ref Range   WBC 5.7 4.0 - 10.5 K/uL   RBC 5.41 (H) 3.87 - 5.11 Mil/uL   Hemoglobin 12.7 12.0 - 15.0 g/dL   HCT 16.1 09.6 - 04.5 %   MCV 73.6 (L) 78.0 - 100.0 fl   MCHC 32.0 30.0 - 36.0 g/dL   RDW 40.9 81.1 - 91.4 %   Platelets 208.0 150.0 - 400.0 K/uL   Neutrophils Relative % 62.8 43.0 - 77.0 %   Lymphocytes Relative 22.2 12.0 - 46.0 %   Monocytes Relative 12.7 (H) 3.0 - 12.0 %   Eosinophils Relative 1.9 0.0 - 5.0 %   Basophils Relative 0.4 0.0 - 3.0 %   Neutro Abs 3.6 1.4 - 7.7 K/uL   Lymphs Abs 1.3 0.7 - 4.0 K/uL   Monocytes Absolute 0.7 0.1 - 1.0 K/uL   Eosinophils Absolute 0.1 0.0 - 0.7 K/uL   Basophils Absolute 0.0 0.0 - 0.1 K/uL  Comprehensive metabolic panel  Result Value Ref Range   Sodium 136 135 - 145 mEq/L   Potassium 3.8 3.5 - 5.1 mEq/L   Chloride 99 96 - 112 mEq/L   CO2 30 19 - 32 mEq/L   Glucose, Bld 84 70 - 99 mg/dL   BUN 8 6 - 23 mg/dL   Creatinine, Ser 7.82 0.40 - 1.20 mg/dL   Total Bilirubin 0.9 0.2 - 1.2 mg/dL   Alkaline Phosphatase 118 (H) 39 - 117 U/L   AST 18 0 - 37 U/L   ALT 16 0 - 35 U/L   Total Protein 6.9 6.0 - 8.3 g/dL   Albumin 4.4 3.5 - 5.2 g/dL   GFR 95.62 >13.08 mL/min   Calcium  10.3 8.4 - 10.5 mg/dL    Assessment and Plan  Ptosis of right eyelid Assessment & Plan: Acute, recurrent Initially thought this was secondary to dental procedure and extra dosing of anesthetic given incorrectly. Now that it is returned after episode of reading for a while I do have some concerns that this could be a muscular disorder.  She has no other clear symptoms of early muscle fatigue but issue leg myasthenia gravis is a possibility. Recent MRI showed no clear masses or stroke.  Her symptoms or not clearly consistent with stroke.  Not clearly Bell's pasly but she did likely have recent viral infection. Cranial nerves grossly intact including 3 and 7 given normal extraocular muscles normal pupils. Given there is some slight erythema I wonder if some of her symptoms could be related to sinus pressure and allergies.  She will use an antihistamine and add Nasacort 2 sprays per nostril daily. She will call to schedule an appoint with neurology for further evaluation.  If she has difficulty doing this she will contact me for me to place an additional referral.  Return and ER precautions provided     No follow-ups on file.   Kerby Nora, MD

## 2022-12-28 ENCOUNTER — Other Ambulatory Visit: Payer: Self-pay

## 2022-12-28 DIAGNOSIS — M810 Age-related osteoporosis without current pathological fracture: Secondary | ICD-10-CM

## 2022-12-28 MED ORDER — DENOSUMAB 60 MG/ML ~~LOC~~ SOSY: 60.0000 mg | PREFILLED_SYRINGE | Freq: Once | SUBCUTANEOUS | 0 refills | Status: AC

## 2022-12-28 MED ORDER — DENOSUMAB 60 MG/ML ~~LOC~~ SOSY: 60.0000 mg | PREFILLED_SYRINGE | Freq: Once | SUBCUTANEOUS | 0 refills | Status: DC

## 2022-12-30 ENCOUNTER — Telehealth: Payer: Self-pay | Admitting: Family Medicine

## 2022-12-30 DIAGNOSIS — M810 Age-related osteoporosis without current pathological fracture: Secondary | ICD-10-CM

## 2022-12-30 MED ORDER — DENOSUMAB 60 MG/ML ~~LOC~~ SOSY: 60.0000 mg | PREFILLED_SYRINGE | SUBCUTANEOUS | 0 refills | Status: DC

## 2022-12-30 NOTE — Telephone Encounter (Signed)
Pharmacy called and would like the frequency for  prolia injection rx. The script states the directions for use but not the frequency.

## 2022-12-30 NOTE — Telephone Encounter (Signed)
New Rx sent to [facility] for Prolia 60 mg/ml to inject SQ q 6 months.

## 2023-01-05 NOTE — Telephone Encounter (Signed)
[facility] called in and stated that patient Prolia will be delivered on Jan 12, 2023 with a signature required. Thank you!

## 2023-01-14 ENCOUNTER — Ambulatory Visit (INDEPENDENT_AMBULATORY_CARE_PROVIDER_SITE_OTHER): Payer: BC Managed Care – PPO

## 2023-01-14 DIAGNOSIS — M810 Age-related osteoporosis without current pathological fracture: Secondary | ICD-10-CM

## 2023-01-14 MED ORDER — DENOSUMAB 60 MG/ML ~~LOC~~ SOSY
60.0000 mg | PREFILLED_SYRINGE | Freq: Once | SUBCUTANEOUS | Status: AC
  Administered 2023-01-14: 60 mg via SUBCUTANEOUS

## 2023-01-14 NOTE — Progress Notes (Signed)
Per orders of Dr. Kerby Nora, injection of Prolia 60 mg given [place] right arm given by Lewanda Rife. Patient tolerated injection well.

## 2023-04-12 ENCOUNTER — Other Ambulatory Visit (HOSPITAL_COMMUNITY): Payer: Self-pay

## 2023-04-12 ENCOUNTER — Telehealth: Payer: Self-pay

## 2023-04-12 NOTE — Telephone Encounter (Signed)
Pharmacy Patient Advocate Encounter   Received notification from CoverMyMeds that prior authorization for Prolia is required/requested.   Insurance verification completed.   The patient is insured through  [employer]  .   Per test claim: PA required; PA submitted to [employer] via CoverMyMeds Key/confirmation #/EOC [id number] Status is pending

## 2023-04-15 NOTE — Telephone Encounter (Signed)
Pharmacy Patient Advocate Encounter  Received notification from  [facility]  that Prior Authorization for Michelle Gill has been APPROVED from 04/15/23 to 04/14/24 for 1 dose every 180 days   PA #/Case ID/Reference #: [id number]

## 2023-06-02 ENCOUNTER — Telehealth: Payer: Self-pay | Admitting: Family Medicine

## 2023-06-02 NOTE — Telephone Encounter (Signed)
Pt called requesting a call back Michelle Gill to discuss the process of getting her next prolia inj in November. Call back # [phone]

## 2023-06-03 NOTE — Telephone Encounter (Signed)
Please verify benefits for patient.

## 2023-06-09 NOTE — Telephone Encounter (Signed)
PT called to request an update on status of PA for prolia. Please advise pt at mobile number

## 2023-06-10 ENCOUNTER — Other Ambulatory Visit (HOSPITAL_COMMUNITY): Payer: Self-pay

## 2023-06-10 NOTE — Telephone Encounter (Signed)
See where authorization has been received but do not see where we have cost determination. I have sent message to auth team to get that information.

## 2023-06-10 NOTE — Telephone Encounter (Signed)
Do we have copay for patient?

## 2023-06-11 DIAGNOSIS — L308 Other specified dermatitis: Secondary | ICD-10-CM | POA: Diagnosis not present

## 2023-06-14 NOTE — Telephone Encounter (Signed)
Prolia VOB initiated via [email or web address]  Next Prolia inj DUE: 07/14/23

## 2023-06-16 ENCOUNTER — Other Ambulatory Visit (HOSPITAL_COMMUNITY): Payer: Self-pay

## 2023-06-16 NOTE — Telephone Encounter (Signed)
Pt ready for scheduling for Prolia on or after : 07/14/23  Out-of-pocket cost due at time of visit: $~327  Primary: [employer] [place] Prolia co-insurance: 20% Admin fee co-insurance: 20%  Secondary: N/A Prolia co-insurance:  Admin fee co-insurance:   Medical Benefit Details: Date Benefits were checked: 06/14/23 Deductible: $300 ($0 met of $300 required)/ Coinsurance: 20%/ Admin Fee: 20%  Prior Auth: Approved PA# [id number] Expiration Date: 04/15/23 to 04/14/24  # of doses approved: 2  Pharmacy benefit: Copay $filled 06/14/23 If patient wants fill through the pharmacy benefit please send prescription to: [facility], and include estimated need by date in rx notes. Pharmacy will ship medication directly to the office.  Patient may eligible for Prolia Copay Card. Copay Card can make patient's cost as little as $25. Link to apply: [email or web address]://[email or web address]  ** This summary of benefits is an estimation of the patient's out-of-pocket cost. Exact cost may very based on individual plan coverage.

## 2023-06-17 DIAGNOSIS — M5441 Lumbago with sciatica, right side: Secondary | ICD-10-CM | POA: Diagnosis not present

## 2023-06-17 DIAGNOSIS — M545 Low back pain, unspecified: Secondary | ICD-10-CM | POA: Diagnosis not present

## 2023-06-21 NOTE — Telephone Encounter (Signed)
Please verify pharmacy copay for patient.

## 2023-06-28 ENCOUNTER — Other Ambulatory Visit: Payer: Self-pay

## 2023-06-28 DIAGNOSIS — M810 Age-related osteoporosis without current pathological fracture: Secondary | ICD-10-CM

## 2023-06-28 MED ORDER — DENOSUMAB 60 MG/ML ~~LOC~~ SOSY: 60.0000 mg | PREFILLED_SYRINGE | SUBCUTANEOUS | 0 refills | Status: DC

## 2023-06-28 NOTE — Telephone Encounter (Signed)
Called patient reviewed all following information including appointment, Co pay due at time of visit and if pick up of injection is needed from outside pharmacy.     Out of pocket for patient: send to pharmacy   Lab appointment :  07/16/23  Nurse visit:  will get with CPE on 07/23/23  Lab order placed: No  Prolia has been  []   Ordered   []   Script sent to local pharmacy for patient to bring   [x]   Script sent to Specialty pharmacy   []   Script sent to [facility] to deliver

## 2023-06-30 ENCOUNTER — Telehealth: Payer: Self-pay | Admitting: Family Medicine

## 2023-06-30 NOTE — Telephone Encounter (Signed)
Elenora Fender from [facility] called in and stated that pts Prolia 60mg  180 day supply will be shipped on Tuesday 07/06/2023.

## 2023-07-01 ENCOUNTER — Telehealth: Payer: Self-pay | Admitting: *Deleted

## 2023-07-01 DIAGNOSIS — M810 Age-related osteoporosis without current pathological fracture: Secondary | ICD-10-CM

## 2023-07-01 NOTE — Telephone Encounter (Signed)
-----   Message from Alvina Chou sent at 07/01/2023 11:54 AM EDT ----- Regarding: Lab orders for Fri, 11.8.24 Lab orders for prolia, thganks

## 2023-07-06 NOTE — Telephone Encounter (Signed)
Prolia shipment received today; ppw placed on Michelle Gill's desk.

## 2023-07-07 NOTE — Telephone Encounter (Signed)
Put name on in fridge.

## 2023-07-16 ENCOUNTER — Other Ambulatory Visit (INDEPENDENT_AMBULATORY_CARE_PROVIDER_SITE_OTHER): Payer: BC Managed Care – PPO

## 2023-07-16 DIAGNOSIS — M810 Age-related osteoporosis without current pathological fracture: Secondary | ICD-10-CM

## 2023-07-16 LAB — BASIC METABOLIC PANEL
BUN: 8 mg/dL (ref 6–23)
CO2: 28 meq/L (ref 19–32)
Calcium: 9.2 mg/dL (ref 8.4–10.5)
Chloride: 100 meq/L (ref 96–112)
Creatinine, Ser: 0.69 mg/dL (ref 0.40–1.20)
GFR: 85.59 mL/min (ref >60.00)
Glucose, Bld: 84 mg/dL (ref 70–99)
Potassium: 3.4 meq/L — ABNORMAL LOW (ref 3.5–5.1)
Sodium: 136 meq/L (ref 135–145)

## 2023-07-16 LAB — VITAMIN D 25 HYDROXY (VIT D DEFICIENCY, FRACTURES): VITD: 38.17 ng/mL (ref 30.00–100.00)

## 2023-07-16 NOTE — Progress Notes (Signed)
No critical labs need to be addressed urgently. We will discuss labs in detail at upcoming office visit.   

## 2023-07-23 ENCOUNTER — Ambulatory Visit: Payer: BC Managed Care – PPO | Admitting: Family Medicine

## 2023-07-23 ENCOUNTER — Encounter: Payer: Self-pay | Admitting: Family Medicine

## 2023-07-23 VITALS — BP 104/64 | HR 107 | Temp 98.3°F | Wt 130.4 lb

## 2023-07-23 DIAGNOSIS — Z0000 Encounter for general adult medical examination without abnormal findings: Secondary | ICD-10-CM

## 2023-07-23 DIAGNOSIS — M810 Age-related osteoporosis without current pathological fracture: Secondary | ICD-10-CM

## 2023-07-23 DIAGNOSIS — E559 Vitamin D deficiency, unspecified: Secondary | ICD-10-CM

## 2023-07-23 DIAGNOSIS — D508 Other iron deficiency anemias: Secondary | ICD-10-CM | POA: Diagnosis not present

## 2023-07-23 DIAGNOSIS — R5383 Other fatigue: Secondary | ICD-10-CM

## 2023-07-23 DIAGNOSIS — R7303 Prediabetes: Secondary | ICD-10-CM

## 2023-07-23 DIAGNOSIS — Z1211 Encounter for screening for malignant neoplasm of colon: Secondary | ICD-10-CM

## 2023-07-23 DIAGNOSIS — K5909 Other constipation: Secondary | ICD-10-CM | POA: Diagnosis not present

## 2023-07-23 DIAGNOSIS — E7800 Pure hypercholesterolemia, unspecified: Secondary | ICD-10-CM

## 2023-07-23 DIAGNOSIS — I10 Essential (primary) hypertension: Secondary | ICD-10-CM | POA: Diagnosis not present

## 2023-07-23 MED ORDER — HYDROCHLOROTHIAZIDE 12.5 MG PO TABS: 12.5000 mg | ORAL_TABLET | Freq: Every day | ORAL | 3 refills | Status: DC

## 2023-07-23 MED ORDER — DENOSUMAB 60 MG/ML ~~LOC~~ SOSY
60.0000 mg | PREFILLED_SYRINGE | Freq: Once | SUBCUTANEOUS | Status: AC
  Administered 2023-07-23: 60 mg via SUBCUTANEOUS

## 2023-07-23 NOTE — Assessment & Plan Note (Addendum)
Stable, chronic.   Given recurrent hypokalemia and slightly low sodium, we will lower hydrochlorothiazide dose to 12.5 mg daily.  Her blood pressure will definitely tolerate this.  She will follow at home with regular blood pressures and call if her blood pressure is above goal  < 140/90.

## 2023-07-23 NOTE — Assessment & Plan Note (Signed)
Due for re-eval. 

## 2023-07-23 NOTE — Progress Notes (Signed)
Patient ID: Michelle Gill, female    DOB: [birth year], 72 y.o.   MRN: [id number]  This visit was conducted in person.  BP 104/64 (BP Location: Left Arm, Patient Position: Sitting, Cuff Size: Normal)   Pulse (!) 107   Temp 98.3 F (36.8 C) (Temporal)   Wt 130 lb 6 oz (59.1 kg)   SpO2 99%   BMI 22.73 kg/m    CC:  Chief Complaint  Patient presents with   Annual Exam    Subjective:   HPI: Michelle Gill is a 72 y.o. female presenting on 07/23/2023 for Annual Exam The patient presents for annual medicare wellness, complete physical and review of chronic health problems. He/She also has the following acute concerns today: fatigue x 3-4 weeks  Hypertension: Well-controlled on hydrochlorothiazide 25 mg daily BP Readings from Last 3 Encounters:  07/23/23 104/64  12/25/22 100/60  12/22/22 100/66  Using medication without problems or lightheadedness:  none Chest pain with exertion:none Edema: none Short of breath:none Average home BPs: Other issues:  Elevated Cholesterol: Lab Results  Component Value Date   CHOL 214 (H) 07/10/2022   HDL 93.80 07/10/2022   LDLCALC 106 (H) 07/10/2022   LDLDIRECT 116.0 05/24/2013   TRIG 68.0 07/10/2022   CHOLHDL 2 07/10/2022   Muscle aches:  Diet compliance: heart healthy diet Exercise: very active.. push mowing. Other complaints:  Vit D def/prediabetes  due for re-eval.    Relevant past medical, surgical, family and social history reviewed and updated as indicated. Interim medical history since our last visit reviewed. Allergies and medications reviewed and updated. Outpatient Medications Prior to Visit  Medication Sig Dispense Refill   albuterol (VENTOLIN HFA) 108 (90 Base) MCG/ACT inhaler TAKE 2 PUFFS BY MOUTH EVERY 4 HOURS AS NEEDED 18 each 2   b complex vitamins capsule Take 1 capsule by mouth daily.     BIOTIN PO Take 1 tablet by mouth daily.     cetirizine-pseudoephedrine (ZYRTEC-D) 5-120 MG tablet Take 1 tablet by  mouth daily as needed for allergies.      chlorhexidine (PERIDEX) 0.12 % solution SMARTSIG:By Mouth     CINNAMON PO Take 1 tablet by mouth daily.     D3-50 1.25 MG (50000 UT) capsule TAKE 1 CAPSULE BY MOUTH ONE TIME PER WEEK 12 capsule 0   denosumab (PROLIA) 60 MG/ML SOSY injection Inject 60 mg into the skin every 6 (six) months. Patient has injection app 07/15/23 1 mL 0   esomeprazole (NEXIUM) 40 MG capsule Take 40 mg by mouth as needed. For acid reflux     ferrous sulfate 324 (65 FE) MG TBEC Take 1 tablet by mouth daily.     linaclotide (LINZESS) 72 MCG capsule Take 1 capsule (72 mcg total) by mouth daily before breakfast. 30 capsule 11   meloxicam (MOBIC) 15 MG tablet Take 15 mg by mouth daily.     omega-3 acid ethyl esters (LOVAZA) 1 g capsule Take by mouth daily.     triamcinolone (NASACORT) 55 MCG/ACT AERO nasal inhaler PLACE 2 SPRAYS IN EACH NOSTRIL ONCE A DAY FOR ALLERGIES 16.9 mL 5   hydrochlorothiazide (HYDRODIURIL) 25 MG tablet TAKE 1 TABLET (25 MG TOTAL) BY MOUTH DAILY. 90 tablet 3   No facility-administered medications prior to visit.     Per HPI unless specifically indicated in ROS section below Review of Systems  Constitutional:  Negative for fatigue and fever.  HENT:  Negative for congestion.   Eyes:  Negative for pain.  Respiratory:  Negative for cough and shortness of breath.   Cardiovascular:  Negative for chest pain, palpitations and leg swelling.  Gastrointestinal:  Positive for constipation. Negative for abdominal pain.  Genitourinary:  Negative for dysuria and vaginal bleeding.  Musculoskeletal:  Negative for back pain.  Neurological:  Negative for syncope, light-headedness and headaches.  Psychiatric/Behavioral:  Negative for dysphoric mood.    Objective:  BP 104/64 (BP Location: Left Arm, Patient Position: Sitting, Cuff Size: Normal)   Pulse (!) 107   Temp 98.3 F (36.8 C) (Temporal)   Wt 130 lb 6 oz (59.1 kg)   SpO2 99%   BMI 22.73 kg/m   Wt Readings  from Last 3 Encounters:  07/23/23 130 lb 6 oz (59.1 kg)  12/25/22 128 lb 6 oz (58.2 kg)  12/22/22 128 lb (58.1 kg)      Physical Exam Vitals and nursing note reviewed.  Constitutional:      General: She is not in acute distress.    Appearance: Normal appearance. She is well-developed. She is not ill-appearing or toxic-appearing.  HENT:     Head: Normocephalic.     Right Ear: Hearing, tympanic membrane, ear canal and external ear normal.     Left Ear: Hearing, tympanic membrane, ear canal and external ear normal.     Nose: Nose normal.  Eyes:     General: Lids are normal. Lids are everted, no foreign bodies appreciated.     Conjunctiva/sclera: Conjunctivae normal.     Pupils: Pupils are equal, round, and reactive to light.  Neck:     Thyroid: No thyroid mass or thyromegaly.     Vascular: No carotid bruit.     Trachea: Trachea normal.  Cardiovascular:     Rate and Rhythm: Normal rate and regular rhythm.     Heart sounds: Normal heart sounds, S1 normal and S2 normal. No murmur heard.    No gallop.  Pulmonary:     Effort: Pulmonary effort is normal. No respiratory distress.     Breath sounds: Normal breath sounds. No wheezing, rhonchi or rales.  Abdominal:     General: Bowel sounds are normal. There is no distension or abdominal bruit.     Palpations: Abdomen is soft. There is no fluid wave or mass.     Tenderness: There is no abdominal tenderness. There is no guarding or rebound.     Hernia: No hernia is present.  Musculoskeletal:     Cervical back: Normal range of motion and neck supple.  Lymphadenopathy:     Cervical: No cervical adenopathy.  Skin:    General: Skin is warm and dry.     Findings: No rash.  Neurological:     Mental Status: She is alert.     Cranial Nerves: No cranial nerve deficit.     Sensory: No sensory deficit.  Psychiatric:        Mood and Affect: Mood is not anxious or depressed.        Speech: Speech normal.        Behavior: Behavior normal.  Behavior is cooperative.        Judgment: Judgment normal.       Results for orders placed or performed in visit on 07/16/23  VITAMIN D 25 Hydroxy (Vit-D Deficiency, Fractures)  Result Value Ref Range   VITD 38.17 30.00 - 100.00 ng/mL  Basic metabolic panel  Result Value Ref Range   Sodium 136 135 - 145 mEq/L   Potassium 3.4 (L) 3.5 - 5.1 mEq/L  Chloride 100 96 - 112 mEq/L   CO2 28 19 - 32 mEq/L   Glucose, Bld 84 70 - 99 mg/dL   BUN 8 6 - 23 mg/dL   Creatinine, Ser 8.11 0.40 - 1.20 mg/dL   GFR 91.47 >82.95 mL/min   Calcium 9.2 8.4 - 10.5 mg/dL     COVID 19 screen:  No recent travel or known exposure to COVID19 The patient denies respiratory symptoms of COVID 19 at this time. The importance of social distancing was discussed today.   Assessment and Plan   The patient's preventative maintenance and recommended screening tests for an annual wellness exam were reviewed in full today. Brought up to date unless services declined.  Counselled on the importance of diet, exercise, and its role in overall health and mortality. The patient's FH and SH was reviewed, including their home life, tobacco status, and drug and alcohol status.   Mammogram  negative 09/2022 Colonoscopy 2010, Dr. Arlyce Dice, plans repeat in 2020.  Cologuard neg 2021,  plan colonoscopy. Last DEXA: 09/24/2022 On prolia, repeat in 2 years PAP/DVE: no pap indicated partial hystec, Vaccines:uptodate with PNA,, completed Shingrix,  COVID x 4.,  Will get flu and COVID-vaccine boosters next week. Nonsmoker Hep C: done   Problem List Items Addressed This Visit     Anemia, iron deficiency     Due for re-eval.      Relevant Orders   CBC with Differential/Platelet   IBC + Ferritin   Benign essential HTN    Stable, chronic.   Given recurrent hypokalemia and slightly low sodium, we will lower hydrochlorothiazide dose to 12.5 mg daily.  Her blood pressure will definitely tolerate this.  She will follow at home with  regular blood pressures and call if her blood pressure is above goal  < 140/90.      Relevant Medications   hydrochlorothiazide (HYDRODIURIL) 12.5 MG tablet   Chronic constipation    Chronic,  good control    Linzess 72 mcg daily      High cholesterol    Due for re-eval.      Relevant Medications   hydrochlorothiazide (HYDRODIURIL) 12.5 MG tablet   Other Relevant Orders   Lipid panel   Comprehensive metabolic panel   Osteoporosis   Other fatigue    No exertional chest pain or shortness of breath. Will evaluate with lab work. Adequate sleep, no snoring.      Relevant Orders   TSH   T4, free   T3, free   Vitamin B12   Prediabetes     Due for re-eval.      Vitamin D deficiency     Due for re-eval.      Other Visit Diagnoses     Routine general medical examination at a health care facility    -  Primary   Colon cancer screening       Relevant Orders   Ambulatory referral to Gastroenterology      Meds ordered this encounter  Medications   denosumab (PROLIA) injection 60 mg    Order Specific Question:   Patient is enrolled in [facility] program for this medication and I have provided a copy of the Prolia Medication Guide and Patient Brochure.    Answer:   No    Order Specific Question:   I have reviewed with the patient the information in the Prolia Medication Guide and Patient Counseling Chart including the serious risks of Prolia and symptoms of each risk.    Answer:  Yes    Order Specific Question:   I have advised the patient to seek medical attention if they have signs or symptoms of any of the serious risks.    Answer:   Yes   hydrochlorothiazide (HYDRODIURIL) 12.5 MG tablet    Sig: Take 1 tablet (12.5 mg total) by mouth daily.    Dispense:  90 tablet    Refill:  3    Pt does not need until calls for refill   Orders Placed This Encounter  Procedures   Lipid panel    Standing Status:   Future    Standing Expiration Date:   10/21/2023   Comprehensive  metabolic panel    Standing Status:   Future    Standing Expiration Date:   10/21/2023   CBC with Differential/Platelet    Standing Status:   Future    Standing Expiration Date:   07/22/2024   TSH    Standing Status:   Future    Standing Expiration Date:   07/22/2024   T4, free    Standing Status:   Future    Standing Expiration Date:   07/22/2024   T3, free    Standing Status:   Future    Standing Expiration Date:   07/22/2024   IBC + Ferritin    Standing Status:   Future    Standing Expiration Date:   07/22/2024   Vitamin B12    Standing Status:   Future    Standing Expiration Date:   07/22/2024   Ambulatory referral to Gastroenterology    Standing Status:   Future    Standing Expiration Date:   07/22/2024    Referral Priority:   Routine    Referral Type:   Consultation    Referral Reason:   Specialty Services Required    Number of Visits Requested:   1       Kerby Nora, MD

## 2023-07-23 NOTE — Assessment & Plan Note (Signed)
No exertional chest pain or shortness of breath. Will evaluate with lab work. Adequate sleep, no snoring.

## 2023-07-23 NOTE — Patient Instructions (Addendum)
Decrease hydrochlorothiazide to 12/5 mg daily (1/2 tab of 25 mg). Follow blood pressure at home, restart 25 mg of HCTZ if blood pressure above goal.

## 2023-07-23 NOTE — Assessment & Plan Note (Signed)
Chronic,  good control    Linzess 72 mcg daily

## 2023-07-26 ENCOUNTER — Other Ambulatory Visit (INDEPENDENT_AMBULATORY_CARE_PROVIDER_SITE_OTHER): Payer: BC Managed Care – PPO

## 2023-07-26 DIAGNOSIS — D508 Other iron deficiency anemias: Secondary | ICD-10-CM

## 2023-07-26 DIAGNOSIS — E7800 Pure hypercholesterolemia, unspecified: Secondary | ICD-10-CM | POA: Diagnosis not present

## 2023-07-26 DIAGNOSIS — R5383 Other fatigue: Secondary | ICD-10-CM

## 2023-07-26 LAB — LIPID PANEL
Cholesterol: 236 mg/dL — ABNORMAL HIGH (ref 0–200)
HDL: 89.6 mg/dL (ref >39.00)
LDL Cholesterol: 131 mg/dL — ABNORMAL HIGH (ref 0–99)
NonHDL: 146.23
Total CHOL/HDL Ratio: 3
Triglycerides: 74 mg/dL (ref 0.0–149.0)
VLDL: 14.8 mg/dL (ref 0.0–40.0)

## 2023-07-26 LAB — COMPREHENSIVE METABOLIC PANEL
ALT: 15 U/L (ref 0–35)
AST: 18 U/L (ref 0–37)
Albumin: 4.4 g/dL (ref 3.5–5.2)
Alkaline Phosphatase: 70 U/L (ref 39–117)
BUN: 8 mg/dL (ref 6–23)
CO2: 29 meq/L (ref 19–32)
Calcium: 9.8 mg/dL (ref 8.4–10.5)
Chloride: 100 meq/L (ref 96–112)
Creatinine, Ser: 0.67 mg/dL (ref 0.40–1.20)
GFR: 86.18 mL/min (ref >60.00)
Glucose, Bld: 98 mg/dL (ref 70–99)
Potassium: 4.2 meq/L (ref 3.5–5.1)
Sodium: 138 meq/L (ref 135–145)
Total Bilirubin: 1 mg/dL (ref 0.2–1.2)
Total Protein: 6.3 g/dL (ref 6.0–8.3)

## 2023-07-26 LAB — IBC + FERRITIN
Ferritin: 159.8 ng/mL (ref 10.0–291.0)
Iron: 133 ug/dL (ref 42–145)
Saturation Ratios: 42.2 % (ref 20.0–50.0)
TIBC: 315 ug/dL (ref 250.0–450.0)
Transferrin: 225 mg/dL (ref 212.0–360.0)

## 2023-07-26 LAB — CBC WITH DIFFERENTIAL/PLATELET
Basophils Absolute: 0 10*3/uL (ref 0.0–0.1)
Basophils Relative: 1 % (ref 0.0–3.0)
Eosinophils Absolute: 0.1 10*3/uL (ref 0.0–0.7)
Eosinophils Relative: 2.6 % (ref 0.0–5.0)
HCT: 40.8 % (ref 36.0–46.0)
Hemoglobin: 12.9 g/dL (ref 12.0–15.0)
Lymphocytes Relative: 36.4 % (ref 12.0–46.0)
Lymphs Abs: 1.7 10*3/uL (ref 0.7–4.0)
MCHC: 31.6 g/dL (ref 30.0–36.0)
MCV: 75.2 fL — ABNORMAL LOW (ref 78.0–100.0)
Monocytes Absolute: 0.4 10*3/uL (ref 0.1–1.0)
Monocytes Relative: 8.9 % (ref 3.0–12.0)
Neutro Abs: 2.4 10*3/uL (ref 1.4–7.7)
Neutrophils Relative %: 51.1 % (ref 43.0–77.0)
Platelets: 225 10*3/uL (ref 150.0–400.0)
RBC: 5.43 Mil/uL — ABNORMAL HIGH (ref 3.87–5.11)
RDW: 14.5 % (ref 11.5–15.5)
WBC: 4.7 10*3/uL (ref 4.0–10.5)

## 2023-07-26 LAB — T4, FREE: Free T4: 0.91 ng/dL (ref 0.60–1.60)

## 2023-07-26 LAB — TSH: TSH: 1.34 u[IU]/mL (ref 0.35–5.50)

## 2023-07-26 LAB — T3, FREE: T3, Free: 3.6 pg/mL (ref 2.3–4.2)

## 2023-07-26 LAB — VITAMIN B12: Vitamin B-12: 305 pg/mL (ref 211–911)

## 2023-07-30 ENCOUNTER — Ambulatory Visit (INDEPENDENT_AMBULATORY_CARE_PROVIDER_SITE_OTHER): Payer: BC Managed Care – PPO

## 2023-07-30 DIAGNOSIS — Z23 Encounter for immunization: Secondary | ICD-10-CM | POA: Diagnosis not present

## 2023-07-30 NOTE — Progress Notes (Unsigned)
The 10-year ASCVD risk score (Arnett DK, et al., 2019) is: 14.4%   Values used to calculate the score:     Age: 72 years     Sex: Female     Is Non-Hispanic African American: Yes     Diabetic: No     Tobacco smoker: No     Systolic Blood Pressure: 104 mmHg     Is BP treated: Yes     HDL Cholesterol: 89.6 mg/dL     Total Cholesterol: 236 mg/dL

## 2023-08-04 ENCOUNTER — Other Ambulatory Visit (HOSPITAL_BASED_OUTPATIENT_CLINIC_OR_DEPARTMENT_OTHER): Payer: Self-pay

## 2023-08-04 MED ORDER — COMIRNATY 30 MCG/0.3ML IM SUSY
0.3000 mL | PREFILLED_SYRINGE | Freq: Once | INTRAMUSCULAR | 0 refills | Status: AC
  Filled 2023-08-04: qty 0.3, 1d supply, fill #0

## 2023-09-11 ENCOUNTER — Other Ambulatory Visit: Payer: Self-pay | Admitting: Family Medicine

## 2023-09-14 ENCOUNTER — Other Ambulatory Visit: Payer: Self-pay | Admitting: Family Medicine

## 2023-09-14 NOTE — Telephone Encounter (Signed)
 Copied from CRM [id number]. Topic: Clinical - Medication Refill >> Sep 14, 2023  4:57 PM Drema MATSU wrote: Most Recent Primary Care Visit:  Provider: TENNIE LARAY HERO  Department: [facility]  Visit Type: NURSE VISIT  Date: 07/30/2023  Medication: hydrochlorothiazide  (HYDRODIURIL ) 12.5 MG tablet  Has the patient contacted their pharmacy? Yes Patient  (Agent: If no, request that the patient contact the pharmacy for the refill. If patient does not wish to contact the pharmacy document the reason why and proceed with request.) (Agent: If yes, when and what did the pharmacy advise?)  Is this the correct pharmacy for this prescription?  If no, delete pharmacy and type the correct one.  This is the patient's preferred pharmacy:  [id number], [place] - [address] [place] [address] Phone: [phone] Fax: [phone]  [facility] [place], [place] - [address] [facility] [place] [address] Phone: [phone] Fax: [phone]   Has the prescription been filled recently? No refilled every 90 days   Is the patient out of the medication? No  Has the patient been seen for an appointment in the last year OR does the patient have an upcoming appointment?   Can we respond through MyChart?   Agent: Please be advised that Rx refills may take up to 3 business days. We ask that you follow-up with your pharmacy.

## 2023-09-17 ENCOUNTER — Other Ambulatory Visit: Payer: Self-pay | Admitting: Family Medicine

## 2023-09-17 NOTE — Telephone Encounter (Signed)
 Copied from CRM [id number]. Topic: Clinical - Medication Refill >> Sep 17, 2023  1:04 PM Mercedes MATSU wrote: Most Recent Primary Care Visit:  Provider: TENNIE LARAY HERO  Department: [facility]  Visit Type: NURSE VISIT  Date: 07/30/2023  Medication: ***  Has the patient contacted their pharmacy?  (Agent: If no, request that the patient contact the pharmacy for the refill. If patient does not wish to contact the pharmacy document the reason why and proceed with request.) (Agent: If yes, when and what did the pharmacy advise?)  Is this the correct pharmacy for this prescription?  If no, delete pharmacy and type the correct one.  This is the patient's preferred pharmacy:  [id number], [place] - [address] [place] [address] Phone: [phone] Fax: [phone]  [facility] - [place], [place] - [address] [facility] [address] [facility] [place] [address] Phone: [phone] Fax: [phone]   Has the prescription been filled recently?   Is the patient out of the medication?   Has the patient been seen for an appointment in the last year OR does the patient have an upcoming appointment?   Can we respond through MyChart?   Agent: Please be advised that Rx refills may take up to 3 business days. We ask that you follow-up with your pharmacy.

## 2023-09-20 NOTE — Telephone Encounter (Signed)
 Copied from CRM [id number]. Topic: Clinical - Prescription Issue >> Sep 17, 2023  9:44 AM Macario HERO wrote: Reason for CRM: Patient stated she called on 09/14/23 for a refill and it's still not at the pharmacy. Patient said she took her last medication 09/15/23 and if she cannot pick it up today she will be out all weekend.

## 2023-09-20 NOTE — Telephone Encounter (Signed)
 Copied from CRM [id number]. Topic: Clinical - Prescription Issue >> Sep 17, 2023  1:21 PM Michelle Gill wrote: Reason for CRM: Patient states she called Tuesday for a medication refill of her hydrochlorothiazide  (HYDRODIURIL ) 12.5 MG tablet, she said she also called her pharmacy three times and there is still no medication there. The patient is very upset and is asking for it to be filled before the weekend because she is completely out and nervous about not being able to take her medication. Patient is asking for a call back once medication has been sent.at [phone] just for reassurance. She was nit experiencing any symptoms.

## 2023-09-27 DIAGNOSIS — Z1231 Encounter for screening mammogram for malignant neoplasm of breast: Secondary | ICD-10-CM | POA: Diagnosis not present

## 2023-09-27 LAB — HM MAMMOGRAPHY

## 2023-09-28 ENCOUNTER — Encounter: Payer: Self-pay | Admitting: Family Medicine

## 2023-10-26 ENCOUNTER — Telehealth: Payer: Self-pay | Admitting: *Deleted

## 2023-10-26 DIAGNOSIS — E7800 Pure hypercholesterolemia, unspecified: Secondary | ICD-10-CM

## 2023-10-26 NOTE — Telephone Encounter (Signed)
-----   Message from Lovena Neighbours sent at 10/26/2023 10:20 AM EST ----- Regarding: Labs for Friday 2.28.25 Please put lab orders in future. Thank you, Denny Peon

## 2023-11-01 ENCOUNTER — Other Ambulatory Visit: Payer: BC Managed Care – PPO

## 2023-11-04 ENCOUNTER — Other Ambulatory Visit: Payer: Self-pay

## 2023-11-04 ENCOUNTER — Other Ambulatory Visit (INDEPENDENT_AMBULATORY_CARE_PROVIDER_SITE_OTHER): Payer: BC Managed Care – PPO

## 2023-11-04 DIAGNOSIS — E7800 Pure hypercholesterolemia, unspecified: Secondary | ICD-10-CM | POA: Diagnosis not present

## 2023-11-04 DIAGNOSIS — M810 Age-related osteoporosis without current pathological fracture: Secondary | ICD-10-CM

## 2023-11-04 LAB — LIPID PANEL
Cholesterol: 201 mg/dL — ABNORMAL HIGH (ref 0–200)
HDL: 91.4 mg/dL (ref >39.00)
LDL Cholesterol: 102 mg/dL — ABNORMAL HIGH (ref 0–99)
NonHDL: 110.04
Total CHOL/HDL Ratio: 2
Triglycerides: 42 mg/dL (ref 0.0–149.0)
VLDL: 8.4 mg/dL (ref 0.0–40.0)

## 2023-11-04 LAB — COMPREHENSIVE METABOLIC PANEL
ALT: 12 U/L (ref 0–35)
AST: 17 U/L (ref 0–37)
Albumin: 4.4 g/dL (ref 3.5–5.2)
Alkaline Phosphatase: 64 U/L (ref 39–117)
BUN: 6 mg/dL (ref 6–23)
CO2: 31 meq/L (ref 19–32)
Calcium: 9.3 mg/dL (ref 8.4–10.5)
Chloride: 98 meq/L (ref 96–112)
Creatinine, Ser: 0.62 mg/dL (ref 0.40–1.20)
GFR: 87.64 mL/min (ref >60.00)
Glucose, Bld: 96 mg/dL (ref 70–99)
Potassium: 3.4 meq/L — ABNORMAL LOW (ref 3.5–5.1)
Sodium: 136 meq/L (ref 135–145)
Total Bilirubin: 0.8 mg/dL (ref 0.2–1.2)
Total Protein: 6.9 g/dL (ref 6.0–8.3)

## 2023-11-04 MED ORDER — DENOSUMAB 60 MG/ML ~~LOC~~ SOSY
60.0000 mg | PREFILLED_SYRINGE | Freq: Once | SUBCUTANEOUS | Status: AC
Start: 2024-01-02 — End: ?

## 2023-11-05 ENCOUNTER — Encounter: Payer: Self-pay | Admitting: Family Medicine

## 2023-11-05 ENCOUNTER — Other Ambulatory Visit: Payer: BC Managed Care – PPO

## 2023-11-16 ENCOUNTER — Telehealth: Payer: Self-pay

## 2023-11-16 NOTE — Telephone Encounter (Signed)
 Prolia VOB initiated via [email or web address]  Next Prolia inj DUE: 01/20/24

## 2023-11-18 DIAGNOSIS — H109 Unspecified conjunctivitis: Secondary | ICD-10-CM | POA: Diagnosis not present

## 2023-11-18 DIAGNOSIS — H25813 Combined forms of age-related cataract, bilateral: Secondary | ICD-10-CM | POA: Diagnosis not present

## 2023-11-30 ENCOUNTER — Other Ambulatory Visit (HOSPITAL_COMMUNITY): Payer: Self-pay

## 2023-11-30 NOTE — Telephone Encounter (Signed)
 Pt ready for scheduling for PROLIA on or after : 01/20/24  Option# 1: Buy/Bill (Office supplied medication)  Out-of-pocket cost due at time of clinic visit: $511.30  Number of injection/visits approved: 2  Primary: [employer] Prolia co-insurance: 20% Admin fee co-insurance: 20%  Secondary: --- Prolia co-insurance:  Admin fee co-insurance:   Medical Benefit Details: Date Benefits were checked: 11/23/23 Deductible: $145.7 Met of $300 Required/ Coinsurance: 20%/ Admin Fee: 20%  Prior Auth: APPROVED PA# [id number]  Expiration Date: 04/15/23-04/14/24  # of doses approved: 2 ----------------------------------------------------------------------- Option# 2- Med Obtained from pharmacy:  Pharmacy benefit: Copay $135 (Paid to pharmacy) Admin Fee: $25 (Pay at clinic)  Prior Auth: N/A PA# Expiration Date:   # of doses approved:   If patient wants fill through the pharmacy benefit please send prescription to:  [facility] , and include estimated need by date in rx notes. Pharmacy will ship medication directly to the office.  Patient IS eligible for Prolia Copay Card. Copay Card can make patient's cost as little as $25. Link to apply: https://www.amgensupportplus.com/copay  ** This summary of benefits is an estimation of the patient's out-of-pocket cost. Exact cost may very based on individual plan coverage.

## 2023-11-30 NOTE — Telephone Encounter (Signed)
 Michelle Gill

## 2023-12-07 NOTE — Telephone Encounter (Signed)
 LM for pt to return call.  See referral for further documentation.

## 2023-12-08 ENCOUNTER — Ambulatory Visit: Admitting: Family Medicine

## 2023-12-08 ENCOUNTER — Encounter: Payer: Self-pay | Admitting: Family Medicine

## 2023-12-08 VITALS — BP 124/78 | HR 105 | Temp 97.5°F | Ht 63.5 in | Wt 131.0 lb

## 2023-12-08 DIAGNOSIS — I10 Essential (primary) hypertension: Secondary | ICD-10-CM | POA: Diagnosis not present

## 2023-12-08 DIAGNOSIS — E876 Hypokalemia: Secondary | ICD-10-CM

## 2023-12-08 DIAGNOSIS — L659 Nonscarring hair loss, unspecified: Secondary | ICD-10-CM | POA: Diagnosis not present

## 2023-12-08 MED ORDER — TRIAMTERENE-HCTZ 37.5-25 MG PO TABS: 1.0000 | ORAL_TABLET | Freq: Every day | ORAL | 11 refills | Status: DC

## 2023-12-08 MED ORDER — KETOCONAZOLE 2 % EX SHAM: 1.0000 | MEDICATED_SHAMPOO | CUTANEOUS | 1 refills | Status: DC

## 2023-12-08 NOTE — Assessment & Plan Note (Addendum)
 Stable, chronic.    Restart hydrochlorothiazide 25 mg daily but will do so in the form of Maxide along with potassium saving triamterene.  HCTZ/triamterene 25 mg / 37.5 mg p.o. daily.  Recent check bmet in 1 to 2 months for potassium

## 2023-12-08 NOTE — Assessment & Plan Note (Signed)
 Chronic, ongoing 6 to 9 months. She does have some scalp irritation so may be having reaction to hair dye versus some element of dry scalp/seborrheic dermatitis. She will start a trial of ketoconazole 2% shampoo twice a week for the next 1 to 2 months. 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  Her iron and thyroid levels within the last 6 months have been normal.

## 2023-12-08 NOTE — Progress Notes (Signed)
 Patient ID: Michelle Gill, female    DOB: 12/12/1950, 73 y.o.   MRN: [id number]  This visit was conducted in person.  BP 124/78   Pulse (!) 105   Temp (!) 97.5 F (36.4 C) (Temporal)   Ht 5' 3.5" (1.613 m)   Wt 131 lb (59.4 kg)   SpO2 99%   BMI 22.84 kg/m    CC:  Chief Complaint  Patient presents with   Medical Management of Chronic Issues    Patient switched back to taking hydrochlorothiazide 25mg  rather than 12.5mg . She states she made this switch after taking the 12.5mg  dose for a couple weeks. Patient says she feel comfortable on the 25mg  dose, she doesn't feel as weighed down.    Alopecia    Pt concerns with recent hair loss. First noticed 2 months ago.  Scalp is intermittently itchy    Subjective:   HPI: Michelle Gill is a 73 y.o. female presenting on 12/08/2023 for Medical Management of Chronic Issues (Patient switched back to taking hydrochlorothiazide 25mg  rather than 12.5mg . She states she made this switch after taking the 12.5mg  dose for a couple weeks. Patient says she feel comfortable on the 25mg  dose, she doesn't feel as weighed down. ) and Alopecia (Pt concerns with recent hair loss. First noticed 2 months ago. Michelle Gill is intermittently itchy)  Hypertension: wants to switch back to 25 mg daily, she states she feels better on 25 mg dose with less heaviness/sensation of edema.... We had lowered this down to 12.5 mg daily given recurrent hypokalemia and slightly low sodium.  BP Readings from Last 3 Encounters:  12/08/23 124/78  07/23/23 104/64  12/25/22 100/60  Using medication without problems or lightheadedness: none Chest pain with exertion: none Edema:none Short of breath: none Average home BPs: Other issues:     Alopecia, ongoing  6-9 months  Worse in last 3-4 months She does color her hair.. changed over to vegan.  She has itchy scalp... no debris, no dandruff. Has tried changing to oils. Taking biotin.  Saw Dermatologist... felt no skin  issue  Hx of iron def anemia..  Reviewed labs in detail from July 26, 2023, normal TSH, T4 and T3, normal iron panel and hemoglobin 12.9  Relevant past medical, surgical, family and social history reviewed and updated as indicated. Interim medical history since our last visit reviewed. Allergies and medications reviewed and updated. Outpatient Medications Prior to Visit  Medication Sig Dispense Refill   albuterol (VENTOLIN HFA) 108 (90 Base) MCG/ACT inhaler TAKE 2 PUFFS BY MOUTH EVERY 4 HOURS AS NEEDED 18 each 2   b complex vitamins capsule Take 1 capsule by mouth daily.     BIOTIN PO Take 1 tablet by mouth daily.     cetirizine-pseudoephedrine (ZYRTEC-D) 5-120 MG tablet Take 1 tablet by mouth daily as needed for allergies.      chlorhexidine (PERIDEX) 0.12 % solution SMARTSIG:By Mouth     CINNAMON PO Take 1 tablet by mouth daily.     D3-50 1.25 MG (50000 UT) capsule TAKE 1 CAPSULE BY MOUTH ONE TIME PER WEEK 12 capsule 0   denosumab (PROLIA) 60 MG/ML SOSY injection Inject 60 mg into the skin every 6 (six) months. Patient has injection app 07/15/23 1 mL 0   esomeprazole (NEXIUM) 40 MG capsule Take 40 mg by mouth as needed. For acid reflux     ferrous sulfate 324 (65 FE) MG TBEC Take 1 tablet by mouth daily.     linaclotide (LINZESS) 72  MCG capsule Take 1 capsule (72 mcg total) by mouth daily before breakfast. 30 capsule 11   omega-3 acid ethyl esters (LOVAZA) 1 g capsule Take by mouth daily.     triamcinolone (NASACORT) 55 MCG/ACT AERO nasal inhaler PLACE 2 SPRAYS IN EACH NOSTRIL ONCE A DAY FOR ALLERGIES 16.9 mL 5   hydrochlorothiazide (HYDRODIURIL) 12.5 MG tablet Take 1 tablet (12.5 mg total) by mouth daily. 90 tablet 3   meloxicam (MOBIC) 15 MG tablet Take 15 mg by mouth daily. (Patient not taking: Reported on 12/08/2023)     Facility-Administered Medications Prior to Visit  Medication Dose Route Frequency Provider Last Rate Last Admin   [START ON 01/02/2024] denosumab (PROLIA) injection  60 mg  60 mg Subcutaneous Once Demarea Lorey E, MD         Per HPI unless specifically indicated in ROS section below Review of Systems  Constitutional:  Negative for fatigue and fever.  HENT:  Negative for congestion.   Eyes:  Negative for pain.  Respiratory:  Negative for cough and shortness of breath.   Cardiovascular:  Negative for chest pain, palpitations and leg swelling.  Gastrointestinal:  Negative for abdominal pain.  Genitourinary:  Negative for dysuria and vaginal bleeding.  Musculoskeletal:  Negative for back pain.  Skin:  Negative for rash.  Neurological:  Negative for syncope, light-headedness and headaches.  Psychiatric/Behavioral:  Negative for dysphoric mood.    Objective:  BP 124/78   Pulse (!) 105   Temp (!) 97.5 F (36.4 C) (Temporal)   Ht 5' 3.5" (1.613 m)   Wt 131 lb (59.4 kg)   SpO2 99%   BMI 22.84 kg/m   Wt Readings from Last 3 Encounters:  12/08/23 131 lb (59.4 kg)  07/23/23 130 lb 6 oz (59.1 kg)  12/25/22 128 lb 6 oz (58.2 kg)      Physical Exam Constitutional:      General: She is not in acute distress.    Appearance: Normal appearance. She is well-developed. She is not ill-appearing or toxic-appearing.  HENT:     Head: Normocephalic.     Right Ear: Hearing, tympanic membrane, ear canal and external ear normal. Tympanic membrane is not erythematous, retracted or bulging.     Left Ear: Hearing, tympanic membrane, ear canal and external ear normal. Tympanic membrane is not erythematous, retracted or bulging.     Nose: No mucosal edema or rhinorrhea.     Right Sinus: No maxillary sinus tenderness or frontal sinus tenderness.     Left Sinus: No maxillary sinus tenderness or frontal sinus tenderness.     Mouth/Throat:     Pharynx: Uvula midline.  Eyes:     General: Lids are normal. Lids are everted, no foreign bodies appreciated.     Conjunctiva/sclera: Conjunctivae normal.     Pupils: Pupils are equal, round, and reactive to light.  Neck:      Thyroid: No thyroid mass or thyromegaly.     Vascular: No carotid bruit.     Trachea: Trachea normal.  Cardiovascular:     Rate and Rhythm: Normal rate and regular rhythm.     Pulses: Normal pulses.     Heart sounds: Normal heart sounds, S1 normal and S2 normal. No murmur heard.    No friction rub. No gallop.  Pulmonary:     Effort: Pulmonary effort is normal. No tachypnea or respiratory distress.     Breath sounds: Normal breath sounds. No decreased breath sounds, wheezing, rhonchi or rales.  Abdominal:  General: Bowel sounds are normal.     Palpations: Abdomen is soft.     Tenderness: There is no abdominal tenderness.  Musculoskeletal:     Cervical back: Normal range of motion and neck supple.  Skin:    General: Skin is warm and dry.     Findings: No rash.     Comments: No flake on scalp but skin does appear slightly dry, coarse hair.  Hair does not come out with gentle pull.   Neurological:     Mental Status: She is alert.  Psychiatric:        Mood and Affect: Mood is not anxious or depressed.        Speech: Speech normal.        Behavior: Behavior normal. Behavior is cooperative.        Thought Content: Thought content normal.        Judgment: Judgment normal.       Results for orders placed or performed in visit on 11/04/23  Comprehensive metabolic panel   Collection Time: 11/04/23  8:30 AM  Result Value Ref Range   Sodium 136 135 - 145 mEq/L   Potassium 3.4 (L) 3.5 - 5.1 mEq/L   Chloride 98 96 - 112 mEq/L   CO2 31 19 - 32 mEq/L   Glucose, Bld 96 70 - 99 mg/dL   BUN 6 6 - 23 mg/dL   Creatinine, Ser 8.29 0.40 - 1.20 mg/dL   Total Bilirubin 0.8 0.2 - 1.2 mg/dL   Alkaline Phosphatase 64 39 - 117 U/L   AST 17 0 - 37 U/L   ALT 12 0 - 35 U/L   Total Protein 6.9 6.0 - 8.3 g/dL   Albumin 4.4 3.5 - 5.2 g/dL   GFR 56.21 >30.86 mL/min   Calcium 9.3 8.4 - 10.5 mg/dL  Lipid panel   Collection Time: 11/04/23  8:30 AM  Result Value Ref Range   Cholesterol 201 (H) 0 -  200 mg/dL   Triglycerides 57.8 0.0 - 149.0 mg/dL   HDL 46.96 >29.52 mg/dL   VLDL 8.4 0.0 - 84.1 mg/dL   LDL Cholesterol 324 (H) 0 - 99 mg/dL   Total CHOL/HDL Ratio 2    NonHDL 110.04     Assessment and Plan  Benign essential HTN Assessment & Plan: Stable, chronic.    Restart hydrochlorothiazide 25 mg daily but will do so in the form of Maxide along with potassium saving triamterene.  HCTZ/triamterene 25 mg / 37.5 mg p.o. daily.  Recent check bmet in 1 to 2 months for potassium  Orders: -     Basic metabolic panel with GFR; Future  Alopecia Assessment & Plan: Chronic, ongoing 6 to 9 months. She does have some scalp irritation so may be having reaction to hair dye versus some element of dry scalp/seborrheic dermatitis. She will start a trial of ketoconazole 2% shampoo twice a week for the next 1 to 2 months. 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  Her iron and thyroid levels within the last 6 months have been normal.   Hypokalemia -     Basic metabolic panel with GFR; Future  Other orders -     Triamterene-HCTZ; Take 1 tablet by mouth daily.  Dispense: 30 tablet; Refill: 11 -     Ketoconazole; Apply 1 Application topically 2 (two) times a week.  Dispense: 120 mL; Refill: 1    Return for lab only  for hypokaelmia.  Kerby Nora, MD

## 2023-12-09 ENCOUNTER — Encounter (HOSPITAL_COMMUNITY): Payer: Self-pay

## 2023-12-09 ENCOUNTER — Other Ambulatory Visit (HOSPITAL_COMMUNITY): Payer: Self-pay

## 2023-12-09 ENCOUNTER — Other Ambulatory Visit: Payer: Self-pay | Admitting: *Deleted

## 2023-12-09 ENCOUNTER — Other Ambulatory Visit: Payer: Self-pay

## 2023-12-09 ENCOUNTER — Other Ambulatory Visit: Payer: Self-pay | Admitting: Family Medicine

## 2023-12-09 MED ORDER — DENOSUMAB 60 MG/ML ~~LOC~~ SOSY
60.0000 mg | PREFILLED_SYRINGE | Freq: Once | SUBCUTANEOUS | 0 refills | Status: AC
Start: 2023-12-09 — End: 2024-01-05
  Filled 2023-12-09: qty 1, 1d supply, fill #0
  Filled 2023-12-27: qty 1, 180d supply, fill #0

## 2023-12-10 ENCOUNTER — Other Ambulatory Visit: Payer: Self-pay

## 2023-12-16 ENCOUNTER — Telehealth: Payer: Self-pay | Admitting: *Deleted

## 2023-12-16 NOTE — Telephone Encounter (Signed)
 LM for pt to returncall

## 2023-12-16 NOTE — Telephone Encounter (Signed)
 I think patient is talking about her Prolia.  Normally her refill comes from [facility] but last Prolia Rx was sent to [facility].  Will forward to JoEllen/Lindsay to follow up with this.

## 2023-12-16 NOTE — Telephone Encounter (Signed)
 Copied from CRM [id number]. Topic: General - Other >> Dec 16, 2023 11:50 AM Elizebeth Brooking wrote: Reason for CRM: Patient called in stating that the pharmacy was suppose come to the office but instead it had stated they were sending it to [facility], wanted to know if it is coming there   [phone]

## 2023-12-23 ENCOUNTER — Other Ambulatory Visit: Payer: Self-pay | Admitting: Family Medicine

## 2023-12-23 NOTE — Telephone Encounter (Signed)
 Copied from CRM [id number]. Topic: Clinical - Medication Refill >> Dec 23, 2023  3:05 PM Albertha Alosa wrote: Most Recent Primary Care Visit:  Provider: Herby Lolling E  Department: [facility]  Visit Type: OFFICE VISIT  Date: 12/08/2023  Medication: denosumab (PROLIA) injection 60 mg   Has the patient contacted their pharmacy? Yes (Agent: If no, request that the patient contact the pharmacy for the refill. If patient does not wish to contact the pharmacy document the reason why and proceed with request.) (Agent: If yes, when and what did the pharmacy advise?)  Is this the correct pharmacy for this prescription? Yes If no, delete pharmacy and type the correct one.   [facility] - [place], [place] - [address] [place] [address] Phone: [phone] Fax: [phone]     Has the prescription been filled recently? No  Is the patient out of the medication? Yes  Has the patient been seen for an appointment in the last year OR does the patient have an upcoming appointment? Yes  Can we respond through MyChart? Yes  Agent: Please be advised that Rx refills may take up to 3 business days. We ask that you follow-up with your pharmacy.

## 2023-12-27 ENCOUNTER — Other Ambulatory Visit: Payer: Self-pay

## 2023-12-27 ENCOUNTER — Telehealth: Payer: Self-pay

## 2023-12-27 NOTE — Telephone Encounter (Signed)
 Copied from CRM [id number]. Topic: Clinical - Medication Refill >> Dec 27, 2023 11:22 AM [name] wrote: Most Recent Primary Care Visit:  Provider: Herby Lolling E  Department: [facility]  Visit Type: OFFICE VISIT  Date: 12/08/2023  Medication: PROLIA   Has the patient contacted their pharmacy? No (Agent: If no, request that the patient contact the pharmacy for the refill. If patient does not wish to contact the pharmacy document the reason why and proceed with request.) (Agent: If yes, when and what did the pharmacy advise?)  Is this the correct pharmacy for this prescription? Yes If no, delete pharmacy and type the correct one.  This is the patient's preferred pharmacy:  [facility] - [place], [place] - [address] [place] [place] [address] Phone: [phone] Fax: [phone]   Has the prescription been filled recently? No  Is the patient out of the medication? Yes  Has the patient been seen for an appointment in the last year OR does the patient have an upcoming appointment? Yes  Can we respond through MyChart? Yes  Agent: Please be advised that Rx refills may take up to 3 business days. We ask that you follow-up with your pharmacy.

## 2023-12-27 NOTE — Progress Notes (Signed)
 Specialty Pharmacy Initial Fill Coordination Note  Michelle Gill is a 73 y.o. female contacted today regarding initial fill of specialty medication(s) Denosumab  (PROLIA )   Patient requested Courier to Provider Office   Delivery date: 01/05/24   Verified address: [facility]   Medication will be filled on 4/29.   Patient is aware of $25 copayment.

## 2023-12-27 NOTE — Telephone Encounter (Signed)
 Rx for Prolia  has been sent to [facility]. Pt's prolia  appointment is on 01/20/24, [facility] will ship the medication to us  when it gets closer to her appointment.  LM for pt to return call.

## 2023-12-27 NOTE — Progress Notes (Addendum)
 Pharmacy Patient Advocate Encounter  Insurance verification completed.   The patient is insured through [facility]   Ran test claim for Prolia . Co-pay is $25. Patient has copay card.   This test claim was processed through [facility]- copay amounts may vary at other pharmacies due to pharmacy/plan contracts, or as the patient moves through the different stages of their insurance plan.

## 2023-12-28 NOTE — Telephone Encounter (Signed)
 Left a detailed message on the pt's VM making her aware of the below information.

## 2024-01-04 ENCOUNTER — Other Ambulatory Visit: Payer: Self-pay

## 2024-01-07 ENCOUNTER — Other Ambulatory Visit

## 2024-01-10 ENCOUNTER — Telehealth: Payer: Self-pay | Admitting: Family Medicine

## 2024-01-10 NOTE — Telephone Encounter (Signed)
 Copied from CRM [id number]. Topic: General - Other >> Jan 10, 2024  1:12 PM Chuck Crater wrote: Reason for CRM: Patient states that she keeps getting calls from [facility] reminding her that she has to order Prolia  injection. Patient wants to know where Prolia  shot is coming from before her appointment on 05/15.

## 2024-01-11 ENCOUNTER — Encounter: Payer: Self-pay | Admitting: Family Medicine

## 2024-01-11 ENCOUNTER — Other Ambulatory Visit (INDEPENDENT_AMBULATORY_CARE_PROVIDER_SITE_OTHER)

## 2024-01-11 DIAGNOSIS — E876 Hypokalemia: Secondary | ICD-10-CM | POA: Diagnosis not present

## 2024-01-11 DIAGNOSIS — I10 Essential (primary) hypertension: Secondary | ICD-10-CM

## 2024-01-11 LAB — BASIC METABOLIC PANEL WITH GFR
BUN: 8 mg/dL (ref 6–23)
CO2: 30 meq/L (ref 19–32)
Calcium: 9.5 mg/dL (ref 8.4–10.5)
Chloride: 100 meq/L (ref 96–112)
Creatinine, Ser: 0.73 mg/dL (ref 0.40–1.20)
GFR: 80.83 mL/min (ref >60.00)
Glucose, Bld: 90 mg/dL (ref 70–99)
Potassium: 3.8 meq/L (ref 3.5–5.1)
Sodium: 138 meq/L (ref 135–145)

## 2024-01-11 NOTE — Telephone Encounter (Signed)
 LM for pt to return call.  We have already received the pt's medication from [facility].

## 2024-01-12 NOTE — Telephone Encounter (Signed)
 LM for pt to returncall

## 2024-01-13 ENCOUNTER — Encounter (HOSPITAL_COMMUNITY): Payer: Self-pay

## 2024-01-14 NOTE — Telephone Encounter (Signed)
 LM for pt to returncall

## 2024-01-17 NOTE — Telephone Encounter (Signed)
 Verified that we have received prolia  from pharmacy for injection. We have reached out to patient x 3 to let know and no call back. Will address if any further questions at her injection appointment.

## 2024-01-20 ENCOUNTER — Ambulatory Visit (INDEPENDENT_AMBULATORY_CARE_PROVIDER_SITE_OTHER)

## 2024-01-20 DIAGNOSIS — M810 Age-related osteoporosis without current pathological fracture: Secondary | ICD-10-CM | POA: Diagnosis not present

## 2024-01-20 MED ORDER — DENOSUMAB 60 MG/ML ~~LOC~~ SOSY
60.0000 mg | PREFILLED_SYRINGE | Freq: Once | SUBCUTANEOUS | Status: AC
Start: 2024-07-22 — End: ?

## 2024-01-20 MED ORDER — DENOSUMAB 60 MG/ML ~~LOC~~ SOSY
60.0000 mg | PREFILLED_SYRINGE | Freq: Once | SUBCUTANEOUS | Status: AC
  Administered 2024-01-20: 60 mg via SUBCUTANEOUS

## 2024-01-20 NOTE — Progress Notes (Signed)
 Per orders of Dr. Herby Lolling, injection of prolia  60 mg given by Claretha Crocker in right arm.(Pt requested right arm) Patient tolerated injection well. Patient will make appointment for 6 month.

## 2024-01-27 ENCOUNTER — Other Ambulatory Visit: Payer: Self-pay | Admitting: Family Medicine

## 2024-04-06 ENCOUNTER — Other Ambulatory Visit: Payer: Self-pay | Admitting: Family Medicine

## 2024-04-17 DIAGNOSIS — M542 Cervicalgia: Secondary | ICD-10-CM | POA: Diagnosis not present

## 2024-04-17 DIAGNOSIS — M25511 Pain in right shoulder: Secondary | ICD-10-CM | POA: Diagnosis not present

## 2024-04-17 DIAGNOSIS — M4722 Other spondylosis with radiculopathy, cervical region: Secondary | ICD-10-CM | POA: Diagnosis not present

## 2024-04-24 ENCOUNTER — Ambulatory Visit: Payer: Self-pay

## 2024-04-24 NOTE — Telephone Encounter (Signed)
 FYI Only or Action Required?: FYI only for provider.  Patient was last seen in primary care on 12/08/2023 by Avelina Greig BRAVO, MD.  Called Nurse Triage reporting Medication Problem.  Symptoms began several days ago.  Interventions attempted: Nothing.  Symptoms are: unchanged.  Triage Disposition: Call Pharmacist Within 24 - 48 Hours  Patient/caregiver understands and will follow disposition?: Yes     Copied from CRM #[id number]. Topic: Clinical - Red Word Triage >> Apr 24, 2024  3:21 PM Martinique E wrote: [facility] that prompted transfer to Nurse Triage: Medication side effects. Patient was started on prednisone  for a pinched nerve in her arm and started not feeling well, experiencing body weakness, indigestion, and upset stomach. Symptoms going on for 1 week. Reason for Disposition  [1] Caller has medicine question about med NOT prescribed by primary care doctor (or NP/PA) or specialist AND [2] triager unable to answer question (e.g., compatibility with other med, storage)  Answer Assessment - Initial Assessment Questions 1. NAME of MEDICINE: What medicine(s) are you calling about?     prednisone  2. QUESTION: What is your question? (e.g., double dose of medicine, side effect)     Once started on prednisone , last Monday and not feeling well, body weakness, nausea, indigestion and upset stomach 3. PRESCRIBER: Who prescribed the medicine? Reason: if prescribed by specialist, call should be referred to that group.     Ortho 4. SYMPTOMS: Do you have any symptoms? If Yes, ask: What symptoms are you having?  How bad are the symptoms (e.g., mild, moderate, severe)     See above 5. PREGNANCY:  Is there any chance that you are pregnant? When was your last menstrual period?     na  Protocols used: Medication Question Call-A-AH

## 2024-04-25 NOTE — Telephone Encounter (Signed)
 Noted, patient with appointment on Wednesday.

## 2024-04-26 ENCOUNTER — Ambulatory Visit: Payer: Self-pay | Admitting: Family Medicine

## 2024-04-26 ENCOUNTER — Ambulatory Visit: Admitting: Family Medicine

## 2024-04-26 VITALS — BP 118/84 | HR 75 | Temp 97.6°F | Ht 63.5 in | Wt 135.0 lb

## 2024-04-26 DIAGNOSIS — I10 Essential (primary) hypertension: Secondary | ICD-10-CM

## 2024-04-26 DIAGNOSIS — Z841 Family history of disorders of kidney and ureter: Secondary | ICD-10-CM

## 2024-04-26 DIAGNOSIS — M5412 Radiculopathy, cervical region: Secondary | ICD-10-CM | POA: Diagnosis not present

## 2024-04-26 LAB — BASIC METABOLIC PANEL WITH GFR
BUN: 11 mg/dL (ref 6–23)
CO2: 32 meq/L (ref 19–32)
Calcium: 9.3 mg/dL (ref 8.4–10.5)
Chloride: 99 meq/L (ref 96–112)
Creatinine, Ser: 0.63 mg/dL (ref 0.40–1.20)
GFR: 87.01 mL/min (ref >60.00)
Glucose, Bld: 87 mg/dL (ref 70–99)
Potassium: 3.6 meq/L (ref 3.5–5.1)
Sodium: 139 meq/L (ref 135–145)

## 2024-04-26 NOTE — Assessment & Plan Note (Signed)
 Acute flare of chronic issue Per patient she is dramatically better after systemic steroid injection and what sounds like Toradol. She did stop the prednisone  and muscle relaxant given side effects.  She is using Aleve twice daily as needed for pain. She plans to do physical therapy as things continue to improve per orthopedics Dr. Irving.  As soon as she is able to she will change to Tylenol  for pain given family issues of renal failure.

## 2024-04-26 NOTE — Assessment & Plan Note (Addendum)
 Occurring in the mother, sister and brother Per patient most likely secondary to diabetes and hypertension.

## 2024-04-26 NOTE — Assessment & Plan Note (Signed)
 Chronic, well-controlled on Maxide 25 mg / 37.5 mg p.o. daily.  Patient is concerned about building up too much potassium.  We discussed in detail that this medication was changed from HCTZ given persistently low potassium down to 2.9.  We will check basic metabolic panel today for renal function and potassium levels.  Overall I would encourage her to continue Maxide instead of changing to HCTZ.  We did briefly discuss trial of ACE or ARB given family history of renal issues but she is worried about this medication as her sister has been on it.

## 2024-04-26 NOTE — Progress Notes (Signed)
 Patient ID: Michelle Gill, female    DOB: [birth year], 73 y.o.   MRN: [id number]  This visit was conducted in person.  BP 118/84   Pulse 75   Temp 97.6 F (36.4 C) (Oral)   Ht 5' 3.5 (1.613 m)   Wt 135 lb (61.2 kg)   SpO2 99%   BMI 23.54 kg/m    CC:  Chief Complaint  Patient presents with   Arm Pain    Pt complains of R arm pain that start 2 weeks ago. Pt states of excruciating pain. Ortho found nerve spasms. Given injections for pain. Pt complains of not reacting well to medication and stopped taking meds for pain. Dull pain    Medication Management    Pt would like to start taking a hydrochlorothiazide      Subjective:   HPI: Michelle Gill is a 73 y.o. female presenting on 04/26/2024 for Arm Pain (Pt complains of R arm pain that start 2 weeks ago. Pt states of excruciating pain. Ortho found nerve spasms. Given injections for pain. Pt complains of not reacting well to medication and stopped taking meds for pain. Dull pain ) and Medication Management (Pt would like to start taking a hydrochlorothiazide  )  New onset right arm pain ongoing 2 weeks.  Starts in right shoulder.. radiates to right arm. Has numbness and tingling in hand. Saw orthopedics  Dr. Irving Mosses ORtho.  Felt cervical radiculopathy, nerve spasm.  X-ray cervical spine: no fracture.   Given steroid, and possibly toradol. 80% improvement.  Given oral prednisone  but gave SE so stopped.  Muscle relaxant made her feel tired. Still some discomfort but tolerable with aleve 1 tablet BID Feeling better now.  Hypertension:  Previous issues with low potassium on hydrochlorothiazide .  In the past changed to Maxide 25 mg / 37.5 mg p.o. daily Today  BP Readings from Last 3 Encounters:  04/26/24 118/84  12/08/23 124/78  07/23/23 104/64  Using medication without problems or lightheadedness:  Chest pain with exertion: Edema: Short of breath: Average home BPs: Other issues:       Relevant past  medical, surgical, family and social history reviewed and updated as indicated. Interim medical history since our last visit reviewed. Allergies and medications reviewed and updated. Outpatient Medications Prior to Visit  Medication Sig Dispense Refill   albuterol  (VENTOLIN  HFA) 108 (90 Base) MCG/ACT inhaler INHALE 2 PUFFS BY MOUTH EVERY 4 HOURS AS NEEDED 18 each 2   b complex vitamins capsule Take 1 capsule by mouth daily.     BIOTIN PO Take 1 tablet by mouth daily.     cetirizine-pseudoephedrine (ZYRTEC-D) 5-120 MG tablet Take 1 tablet by mouth daily as needed for allergies.      chlorhexidine (PERIDEX) 0.12 % solution SMARTSIG:By Mouth     CINNAMON PO Take 1 tablet by mouth daily.     cyclobenzaprine  (FLEXERIL ) 10 MG tablet SMARTSIG:0.5-1 Tablet(s) By Mouth 2-3 Times Daily PRN     D3-50 1.25 MG (50000 UT) capsule TAKE 1 CAPSULE BY MOUTH ONE TIME PER WEEK 12 capsule 0   esomeprazole (NEXIUM) 40 MG capsule Take 40 mg by mouth as needed. For acid reflux     ferrous sulfate 324 (65 FE) MG TBEC Take 1 tablet by mouth daily.     ketoconazole  (NIZORAL ) 2 % shampoo Apply 1 Application topically 2 (two) times a week. 120 mL 1   linaclotide  (LINZESS ) 72 MCG capsule TAKE 1 CAPSULE BY MOUTH DAILY BEFORE BREAKFAST. 30 capsule 3  MEDROL  4 MG TBPK tablet Take by mouth as directed.     omega-3 acid ethyl esters (LOVAZA) 1 g capsule Take by mouth daily.     triamcinolone  (NASACORT ) 55 MCG/ACT AERO nasal inhaler PLACE 2 SPRAYS IN EACH NOSTRIL ONCE A DAY FOR ALLERGIES 16.9 mL 5   triamterene -hydrochlorothiazide  (MAXZIDE-25) 37.5-25 MG tablet Take 1 tablet by mouth daily. 30 tablet 11   meloxicam (MOBIC) 15 MG tablet Take 15 mg by mouth daily. (Patient not taking: Reported on 04/26/2024)     Facility-Administered Medications Prior to Visit  Medication Dose Route Frequency Provider Last Rate Last Admin   denosumab  (PROLIA ) injection 60 mg  60 mg Subcutaneous Once Hamad Whyte E, MD       [START ON 07/22/2024]  denosumab  (PROLIA ) injection 60 mg  60 mg Subcutaneous Once Francene Mcerlean E, MD         Per HPI unless specifically indicated in ROS section below Review of Systems  Constitutional:  Negative for fatigue and fever.  HENT:  Negative for congestion.   Eyes:  Negative for pain.  Respiratory:  Negative for cough and shortness of breath.   Cardiovascular:  Negative for chest pain, palpitations and leg swelling.  Gastrointestinal:  Negative for abdominal pain.  Genitourinary:  Negative for dysuria and vaginal bleeding.  Musculoskeletal:  Negative for back pain.  Neurological:  Negative for syncope, light-headedness and headaches.  Psychiatric/Behavioral:  Negative for dysphoric mood.    Objective:  BP 118/84   Pulse 75   Temp 97.6 F (36.4 C) (Oral)   Ht 5' 3.5 (1.613 m)   Wt 135 lb (61.2 kg)   SpO2 99%   BMI 23.54 kg/m   Wt Readings from Last 3 Encounters:  04/26/24 135 lb (61.2 kg)  12/08/23 131 lb (59.4 kg)  07/23/23 130 lb 6 oz (59.1 kg)      Physical Exam Constitutional:      General: She is not in acute distress.    Appearance: Normal appearance. She is well-developed. She is not ill-appearing or toxic-appearing.  HENT:     Head: Normocephalic.     Right Ear: Hearing normal. Tympanic membrane is not erythematous, retracted or bulging.     Left Ear: Hearing normal. Tympanic membrane is not erythematous, retracted or bulging.     Nose: No mucosal edema or rhinorrhea.     Right Sinus: No maxillary sinus tenderness or frontal sinus tenderness.     Left Sinus: No maxillary sinus tenderness or frontal sinus tenderness.     Mouth/Throat:     Pharynx: Uvula midline.  Eyes:     General: Lids are normal. Lids are everted, no foreign bodies appreciated.     Conjunctiva/sclera: Conjunctivae normal.     Pupils: Pupils are equal, round, and reactive to light.  Neck:     Thyroid : No thyroid  mass or thyromegaly.     Vascular: No carotid bruit.     Trachea: Trachea normal.   Cardiovascular:     Rate and Rhythm: Normal rate and regular rhythm.     Pulses: Normal pulses.     Heart sounds: Normal heart sounds, S1 normal and S2 normal. No murmur heard.    No friction rub. No gallop.  Pulmonary:     Effort: Pulmonary effort is normal. No tachypnea or respiratory distress.     Breath sounds: Normal breath sounds. No decreased breath sounds, wheezing, rhonchi or rales.  Abdominal:     General: Bowel sounds are normal.  Palpations: Abdomen is soft.     Tenderness: There is no abdominal tenderness.  Musculoskeletal:     Right shoulder: Tenderness present. No bony tenderness. Normal range of motion.     Left shoulder: No tenderness or bony tenderness. Normal range of motion.     Cervical back: Normal range of motion and neck supple. Tenderness present.  Lymphadenopathy:     Cervical: No cervical adenopathy.  Skin:    General: Skin is warm and dry.     Findings: No rash.  Neurological:     General: No focal deficit present.     Mental Status: She is alert.     Cranial Nerves: Cranial nerves 2-12 are intact.     Sensory: Sensation is intact.     Motor: Motor function is intact.     Coordination: Coordination is intact.     Gait: Gait is intact.  Psychiatric:        Mood and Affect: Mood is not anxious or depressed.        Speech: Speech normal.        Behavior: Behavior normal. Behavior is cooperative.        Thought Content: Thought content normal.        Judgment: Judgment normal.       Results for orders placed or performed in visit on 01/11/24  Basic Metabolic Panel   Collection Time: 01/11/24  8:20 AM  Result Value Ref Range   Sodium 138 135 - 145 mEq/L   Potassium 3.8 3.5 - 5.1 mEq/L   Chloride 100 96 - 112 mEq/L   CO2 30 19 - 32 mEq/L   Glucose, Bld 90 70 - 99 mg/dL   BUN 8 6 - 23 mg/dL   Creatinine, Ser 9.26 0.40 - 1.20 mg/dL   GFR 19.16 >39.99 mL/min   Calcium 9.5 8.4 - 10.5 mg/dL    Assessment and Plan  Cervical  radiculopathy Assessment & Plan:  Acute flare of chronic issue Per patient she is dramatically better after systemic steroid injection and what sounds like Toradol. She did stop the prednisone  and muscle relaxant given side effects.  She is using Aleve twice daily as needed for pain. She plans to do physical therapy as things continue to improve per orthopedics Dr. Irving.  As soon as she is able to she will change to Tylenol  for pain given family issues of renal failure.   Hypertension, unspecified type Assessment & Plan: Chronic, well-controlled on Maxide 25 mg / 37.5 mg p.o. daily.  Patient is concerned about building up too much potassium.  We discussed in detail that this medication was changed from HCTZ given persistently low potassium down to 2.9.  We will check basic metabolic panel today for renal function and potassium levels.  Overall I would encourage her to continue Maxide instead of changing to HCTZ.  We did briefly discuss trial of ACE or ARB given family history of renal issues but she is worried about this medication as her sister has been on it.  Orders: -     Basic metabolic panel with GFR  Family history of chronic renal disease Assessment & Plan: Occurring in the mother, sister and brother Per patient most likely secondary to diabetes and hypertension.     No follow-ups on file.   Greig Ring, MD

## 2024-06-21 ENCOUNTER — Telehealth: Payer: Self-pay

## 2024-06-21 NOTE — Telephone Encounter (Signed)
 Prolia  VOB initiated via [email or web address]  Next Prolia  inj DUE: 07/22/24

## 2024-06-22 ENCOUNTER — Other Ambulatory Visit (HOSPITAL_COMMUNITY): Payer: Self-pay

## 2024-06-22 NOTE — Telephone Encounter (Signed)
 SABRA

## 2024-06-23 ENCOUNTER — Other Ambulatory Visit (HOSPITAL_BASED_OUTPATIENT_CLINIC_OR_DEPARTMENT_OTHER): Payer: Self-pay

## 2024-06-23 MED ORDER — COMIRNATY 30 MCG/0.3ML IM SUSY
0.3000 mL | PREFILLED_SYRINGE | Freq: Once | INTRAMUSCULAR | 0 refills | Status: AC
  Filled 2024-06-23: qty 0.3, 1d supply, fill #0

## 2024-06-29 NOTE — Telephone Encounter (Signed)
 MEDICAL PA SUBMITTED VIA [employer].

## 2024-07-04 ENCOUNTER — Other Ambulatory Visit: Payer: Self-pay

## 2024-07-04 ENCOUNTER — Other Ambulatory Visit (HOSPITAL_BASED_OUTPATIENT_CLINIC_OR_DEPARTMENT_OTHER): Payer: Self-pay

## 2024-07-04 MED ORDER — FLUZONE HIGH-DOSE 0.5 ML IM SUSY
0.5000 mL | PREFILLED_SYRINGE | Freq: Once | INTRAMUSCULAR | 0 refills | Status: AC
  Filled 2024-07-04: qty 0.5, 1d supply, fill #0

## 2024-07-11 NOTE — Telephone Encounter (Signed)
 Called insurance at [phone] to check status of PA. Per representative, no PA on file. Initiated a new PA with representative over the phone. Clinical records faxed to insurance at [phone].   CASE #: [id number]

## 2024-07-12 ENCOUNTER — Other Ambulatory Visit (HOSPITAL_COMMUNITY): Payer: Self-pay

## 2024-07-12 ENCOUNTER — Other Ambulatory Visit: Payer: Self-pay | Admitting: Family Medicine

## 2024-07-13 ENCOUNTER — Other Ambulatory Visit (HOSPITAL_COMMUNITY): Payer: Self-pay

## 2024-07-14 ENCOUNTER — Other Ambulatory Visit: Payer: Self-pay

## 2024-07-16 ENCOUNTER — Telehealth: Payer: Self-pay | Admitting: Family Medicine

## 2024-07-16 DIAGNOSIS — E7800 Pure hypercholesterolemia, unspecified: Secondary | ICD-10-CM

## 2024-07-16 DIAGNOSIS — D508 Other iron deficiency anemias: Secondary | ICD-10-CM

## 2024-07-16 DIAGNOSIS — R7303 Prediabetes: Secondary | ICD-10-CM

## 2024-07-16 DIAGNOSIS — E559 Vitamin D deficiency, unspecified: Secondary | ICD-10-CM

## 2024-07-16 NOTE — Telephone Encounter (Signed)
-----   Message from Veva JINNY Ferrari sent at 07/11/2024 10:14 AM EST ----- Regarding: Lab orders for Tue,11.11.25 Patient is scheduled for CPX labs, please order future labs, Thanks , Veva

## 2024-07-18 ENCOUNTER — Ambulatory Visit: Payer: Self-pay | Admitting: Family Medicine

## 2024-07-18 ENCOUNTER — Other Ambulatory Visit (INDEPENDENT_AMBULATORY_CARE_PROVIDER_SITE_OTHER): Payer: BC Managed Care – PPO

## 2024-07-18 DIAGNOSIS — D508 Other iron deficiency anemias: Secondary | ICD-10-CM | POA: Diagnosis not present

## 2024-07-18 DIAGNOSIS — E7800 Pure hypercholesterolemia, unspecified: Secondary | ICD-10-CM | POA: Diagnosis not present

## 2024-07-18 DIAGNOSIS — R7303 Prediabetes: Secondary | ICD-10-CM | POA: Diagnosis not present

## 2024-07-18 DIAGNOSIS — E559 Vitamin D deficiency, unspecified: Secondary | ICD-10-CM | POA: Diagnosis not present

## 2024-07-18 LAB — CBC WITH DIFFERENTIAL/PLATELET
Basophils Absolute: 0.1 K/uL (ref 0.0–0.1)
Basophils Relative: 1.1 % (ref 0.0–3.0)
Eosinophils Absolute: 0.2 K/uL (ref 0.0–0.7)
Eosinophils Relative: 4.5 % (ref 0.0–5.0)
HCT: 38.6 % (ref 36.0–46.0)
Hemoglobin: 12.5 g/dL (ref 12.0–15.0)
Lymphocytes Relative: 34 % (ref 12.0–46.0)
Lymphs Abs: 1.8 K/uL (ref 0.7–4.0)
MCHC: 32.3 g/dL (ref 30.0–36.0)
MCV: 73 fl — ABNORMAL LOW (ref 78.0–100.0)
Monocytes Absolute: 0.4 K/uL (ref 0.1–1.0)
Monocytes Relative: 7.7 % (ref 3.0–12.0)
Neutro Abs: 2.8 K/uL (ref 1.4–7.7)
Neutrophils Relative %: 52.7 % (ref 43.0–77.0)
Platelets: 198 K/uL (ref 150.0–400.0)
RBC: 5.28 Mil/uL — ABNORMAL HIGH (ref 3.87–5.11)
RDW: 13.7 % (ref 11.5–15.5)
WBC: 5.2 K/uL (ref 4.0–10.5)

## 2024-07-18 LAB — COMPREHENSIVE METABOLIC PANEL WITH GFR
ALT: 13 U/L (ref 0–35)
AST: 19 U/L (ref 0–37)
Albumin: 4.6 g/dL (ref 3.5–5.2)
Alkaline Phosphatase: 74 U/L (ref 39–117)
BUN: 10 mg/dL (ref 6–23)
CO2: 30 meq/L (ref 19–32)
Calcium: 9.9 mg/dL (ref 8.4–10.5)
Chloride: 100 meq/L (ref 96–112)
Creatinine, Ser: 0.75 mg/dL (ref 0.40–1.20)
GFR: 77.96 mL/min (ref >60.00)
Glucose, Bld: 94 mg/dL (ref 70–99)
Potassium: 3.5 meq/L (ref 3.5–5.1)
Sodium: 140 meq/L (ref 135–145)
Total Bilirubin: 1.1 mg/dL (ref 0.2–1.2)
Total Protein: 6.7 g/dL (ref 6.0–8.3)

## 2024-07-18 LAB — LIPID PANEL
Cholesterol: 221 mg/dL — ABNORMAL HIGH (ref 0–200)
HDL: 93.3 mg/dL (ref >39.00)
LDL Cholesterol: 115 mg/dL — ABNORMAL HIGH (ref 0–99)
NonHDL: 128.17
Total CHOL/HDL Ratio: 2
Triglycerides: 68 mg/dL (ref 0.0–149.0)
VLDL: 13.6 mg/dL (ref 0.0–40.0)

## 2024-07-18 LAB — HEMOGLOBIN A1C: Hgb A1c MFr Bld: 6 % (ref 4.6–6.5)

## 2024-07-18 LAB — VITAMIN D 25 HYDROXY (VIT D DEFICIENCY, FRACTURES): VITD: 43.21 ng/mL (ref 30.00–100.00)

## 2024-07-18 NOTE — Progress Notes (Signed)
 No critical labs need to be addressed urgently. We will discuss labs in detail at upcoming office visit.

## 2024-07-21 ENCOUNTER — Other Ambulatory Visit (HOSPITAL_COMMUNITY): Payer: Self-pay

## 2024-07-21 NOTE — Telephone Encounter (Signed)
 Called insurance to check status of PA. Per representative, PA has been approved. Effective 07/22/24-07/21/25. Should receive approval letter via fax.

## 2024-07-21 NOTE — Telephone Encounter (Signed)
 Pt ready for scheduling for PROLIA  on or after : 07/22/24  Option# 1: Buy/Bill (Office supplied medication)  Out-of-pocket cost due at time of clinic visit: $20.86  Number of injection/visits approved: 2  Primary: [employer] Prolia  co-insurance: 0% Admin fee co-insurance: 0%  Secondary: MEDICARE Prolia  co-insurance:  Admin fee co-insurance:   Medical Benefit Details: Date Benefits were checked: 06/22/24 Deductible: $279.14 Met of $300 Required/ Coinsurance: 0%/ Admin Fee: 0%  Prior Auth: APPROVED PA# [id number] Expiration Date: 07/22/24-07/21/25  # of doses approved: 2 ----------------------------------------------------------------------- Option# 2- Med Obtained from pharmacy:  Pharmacy benefit: Copay $135 (Paid to pharmacy) Admin Fee: 0% (Pay at clinic)  Prior Auth: N/A PA# Expiration Date:   # of doses approved:   If patient wants fill through the pharmacy benefit please send prescription to: [facility], and include estimated need by date in rx notes. Pharmacy will ship medication directly to the office.  Patient NOT eligible for Prolia  Copay Card. Copay Card can make patient's cost as little as $25. Link to apply: [email or web address]://[email or web address]  ** This summary of benefits is an estimation of the patient's out-of-pocket cost. Exact cost may very based on individual plan coverage.

## 2024-07-25 ENCOUNTER — Encounter: Payer: Self-pay | Admitting: Family Medicine

## 2024-07-25 ENCOUNTER — Ambulatory Visit (INDEPENDENT_AMBULATORY_CARE_PROVIDER_SITE_OTHER): Payer: BC Managed Care – PPO | Admitting: Family Medicine

## 2024-07-25 VITALS — BP 110/66 | HR 77 | Temp 97.7°F | Ht 63.25 in | Wt 133.2 lb

## 2024-07-25 DIAGNOSIS — I10 Essential (primary) hypertension: Secondary | ICD-10-CM

## 2024-07-25 DIAGNOSIS — M810 Age-related osteoporosis without current pathological fracture: Secondary | ICD-10-CM

## 2024-07-25 DIAGNOSIS — E559 Vitamin D deficiency, unspecified: Secondary | ICD-10-CM

## 2024-07-25 DIAGNOSIS — E7800 Pure hypercholesterolemia, unspecified: Secondary | ICD-10-CM

## 2024-07-25 DIAGNOSIS — Z0000 Encounter for general adult medical examination without abnormal findings: Secondary | ICD-10-CM | POA: Diagnosis not present

## 2024-07-25 NOTE — Assessment & Plan Note (Addendum)
 Chronic, increase in 10-year ASCVD risk score to 16%.  Statin indicated and discussed in detail with patient.  Will check lipoprotein a to risk stratify and consider checking CAC score. She will continue to work on low-cholesterol diet.  She will consider retesting cholesterol in 3 months.

## 2024-07-25 NOTE — Progress Notes (Signed)
 Patient ID: Michelle Gill, female    DOB: [birth year], 73 y.o.   MRN: [id number]  This visit was conducted in person.  BP 110/66   Pulse 77   Temp 97.7 F (36.5 C) (Temporal)   Ht 5' 3.25 (1.607 m)   Wt 133 lb 4 oz (60.4 kg)   SpO2 98%   BMI 23.42 kg/m    CC:  Chief Complaint  Patient presents with   Annual Exam    Subjective:   HPI: Michelle Gill is a 73 y.o. female presenting on 07/25/2024 for Annual Exam The patient presents for annual medicare wellness, complete physical and review of chronic health problems. He/She also has the following acute concerns today:   Hypertension: Well-controlled on hydrochlorothiazide  25 mg daily BP Readings from Last 3 Encounters:  07/25/24 110/66  04/26/24 118/84  12/08/23 124/78  Using medication without problems or lightheadedness:  none Chest pain with exertion:none Edema: none Short of breath:none Average home BPs: Other issues:  Elevated Cholesterol: Lab Results  Component Value Date   CHOL 221 (H) 07/18/2024   HDL 93.30 07/18/2024   LDLCALC 115 (H) 07/18/2024   LDLDIRECT 116.0 05/24/2013   TRIG 68.0 07/18/2024   CHOLHDL 2 07/18/2024  The 10-year ASCVD risk score (Arnett DK, et al., 2019) is: 16.4%   Values used to calculate the score:     Age: 42 years     Clincally relevant sex: Female     Is Non-Hispanic African American: Yes     Diabetic: No     Tobacco smoker: No     Systolic Blood Pressure: 110 mmHg     Is BP treated: Yes     HDL Cholesterol: 93.3 mg/dL     Total Cholesterol: 221 mg/dL Muscle aches:  Diet compliance: heart healthy diet Exercise: very active.. push mowing. Other complaints:  Vit D def nml on supplement prediabetes  Lab Results  Component Value Date   HGBA1C 6.0 07/18/2024    Wt Readings from Last 3 Encounters:  07/25/24 133 lb 4 oz (60.4 kg)  04/26/24 135 lb (61.2 kg)  12/08/23 131 lb (59.4 kg)     Relevant past medical, surgical, family and social history reviewed  and updated as indicated. Interim medical history since our last visit reviewed. Allergies and medications reviewed and updated. Outpatient Medications Prior to Visit  Medication Sig Dispense Refill   albuterol  (VENTOLIN  HFA) 108 (90 Base) MCG/ACT inhaler INHALE 2 PUFFS BY MOUTH EVERY 4 HOURS AS NEEDED 18 each 2   BIOTIN PO Take 1 tablet by mouth daily.     cetirizine (ZYRTEC ALLERGY) 10 MG tablet Take 10 mg by mouth daily as needed for allergies or rhinitis.     cetirizine-pseudoephedrine (ZYRTEC-D) 5-120 MG tablet Take 1 tablet by mouth daily as needed for allergies.      CINNAMON PO Take 1 tablet by mouth daily.     D3-50 1.25 MG (50000 UT) capsule TAKE 1 CAPSULE BY MOUTH ONE TIME PER WEEK 12 capsule 0   esomeprazole (NEXIUM) 40 MG capsule Take 40 mg by mouth as needed. For acid reflux     ferrous sulfate 324 (65 FE) MG TBEC Take 1 tablet by mouth daily.     linaclotide  (LINZESS ) 72 MCG capsule Take 72 mcg by mouth daily as needed.     Misc Natural Products (TURMERIC, CURCUMIN, PO) Take 950 mg by mouth daily.     triamcinolone  (NASACORT ) 55 MCG/ACT AERO nasal inhaler PLACE 2 SPRAYS IN [facility]  NOSTRIL ONCE A DAY FOR ALLERGIES 16.9 mL 5   triamterene -hydrochlorothiazide  (MAXZIDE-25) 37.5-25 MG tablet Take 1 tablet by mouth daily. 30 tablet 11   b complex vitamins capsule Take 1 capsule by mouth daily.     chlorhexidine (PERIDEX) 0.12 % solution SMARTSIG:By Mouth     cyclobenzaprine  (FLEXERIL ) 10 MG tablet SMARTSIG:0.5-1 Tablet(s) By Mouth 2-3 Times Daily PRN     ketoconazole  (NIZORAL ) 2 % shampoo Apply 1 Application topically 2 (two) times a week. 120 mL 1   linaclotide  (LINZESS ) 72 MCG capsule TAKE 1 CAPSULE BY MOUTH DAILY BEFORE BREAKFAST. 30 capsule 3   MEDROL  4 MG TBPK tablet Take by mouth as directed.     meloxicam (MOBIC) 15 MG tablet Take 15 mg by mouth daily. (Patient not taking: Reported on 04/26/2024)     omega-3 acid ethyl esters (LOVAZA) 1 g capsule Take by mouth daily.      Facility-Administered Medications Prior to Visit  Medication Dose Route Frequency Provider Last Rate Last Admin   denosumab  (PROLIA ) injection 60 mg  60 mg Subcutaneous Once Keera Altidor E, MD       denosumab  (PROLIA ) injection 60 mg  60 mg Subcutaneous Once Timera Windt E, MD         Per HPI unless specifically indicated in ROS section below Review of Systems  Constitutional:  Negative for fatigue and fever.  HENT:  Negative for congestion.   Eyes:  Negative for pain.  Respiratory:  Negative for cough and shortness of breath.   Cardiovascular:  Negative for chest pain, palpitations and leg swelling.  Gastrointestinal:  Positive for constipation. Negative for abdominal pain.  Genitourinary:  Negative for dysuria and vaginal bleeding.  Musculoskeletal:  Negative for back pain.  Neurological:  Negative for syncope, light-headedness and headaches.  Psychiatric/Behavioral:  Negative for dysphoric mood.    Objective:  BP 110/66   Pulse 77   Temp 97.7 F (36.5 C) (Temporal)   Ht 5' 3.25 (1.607 m)   Wt 133 lb 4 oz (60.4 kg)   SpO2 98%   BMI 23.42 kg/m   Wt Readings from Last 3 Encounters:  07/25/24 133 lb 4 oz (60.4 kg)  04/26/24 135 lb (61.2 kg)  12/08/23 131 lb (59.4 kg)      Physical Exam Vitals and nursing note reviewed.  Constitutional:      General: She is not in acute distress.    Appearance: Normal appearance. She is well-developed. She is not ill-appearing or toxic-appearing.  HENT:     Head: Normocephalic.     Right Ear: Hearing, tympanic membrane, ear canal and external ear normal.     Left Ear: Hearing, tympanic membrane, ear canal and external ear normal.     Nose: Nose normal.  Eyes:     General: Lids are normal. Lids are everted, no foreign bodies appreciated.     Conjunctiva/sclera: Conjunctivae normal.     Pupils: Pupils are equal, round, and reactive to light.  Neck:     Thyroid : No thyroid  mass or thyromegaly.     Vascular: No carotid bruit.      Trachea: Trachea normal.  Cardiovascular:     Rate and Rhythm: Normal rate and regular rhythm.     Heart sounds: Normal heart sounds, S1 normal and S2 normal. No murmur heard.    No gallop.  Pulmonary:     Effort: Pulmonary effort is normal. No respiratory distress.     Breath sounds: Normal breath sounds. No wheezing, rhonchi or  rales.  Abdominal:     General: Bowel sounds are normal. There is no distension or abdominal bruit.     Palpations: Abdomen is soft. There is no fluid wave or mass.     Tenderness: There is no abdominal tenderness. There is no guarding or rebound.     Hernia: No hernia is present.  Musculoskeletal:     Cervical back: Normal range of motion and neck supple.  Lymphadenopathy:     Cervical: No cervical adenopathy.  Skin:    General: Skin is warm and dry.     Findings: No rash.  Neurological:     Mental Status: She is alert.     Cranial Nerves: No cranial nerve deficit.     Sensory: No sensory deficit.  Psychiatric:        Mood and Affect: Mood is not anxious or depressed.        Speech: Speech normal.        Behavior: Behavior normal. Behavior is cooperative.        Judgment: Judgment normal.       Results for orders placed or performed in visit on 07/18/24  CBC with Differential/Platelet   Collection Time: 07/18/24  8:34 AM  Result Value Ref Range   WBC 5.2 4.0 - 10.5 K/uL   RBC 5.28 (H) 3.87 - 5.11 Mil/uL   Hemoglobin 12.5 12.0 - 15.0 g/dL   HCT 61.3 63.9 - 53.9 %   MCV 73.0 (L) 78.0 - 100.0 fl   MCHC 32.3 30.0 - 36.0 g/dL   RDW 86.2 88.4 - 84.4 %   Platelets 198.0 150.0 - 400.0 K/uL   Neutrophils Relative % 52.7 43.0 - 77.0 %   Lymphocytes Relative 34.0 12.0 - 46.0 %   Monocytes Relative 7.7 3.0 - 12.0 %   Eosinophils Relative 4.5 0.0 - 5.0 %   Basophils Relative 1.1 0.0 - 3.0 %   Neutro Abs 2.8 1.4 - 7.7 K/uL   Lymphs Abs 1.8 0.7 - 4.0 K/uL   Monocytes Absolute 0.4 0.1 - 1.0 K/uL   Eosinophils Absolute 0.2 0.0 - 0.7 K/uL   Basophils  Absolute 0.1 0.0 - 0.1 K/uL  VITAMIN D  25 Hydroxy (Vit-D Deficiency, Fractures)   Collection Time: 07/18/24  8:34 AM  Result Value Ref Range   VITD 43.21 30.00 - 100.00 ng/mL  Comprehensive metabolic panel with GFR   Collection Time: 07/18/24  8:34 AM  Result Value Ref Range   Sodium 140 135 - 145 mEq/L   Potassium 3.5 3.5 - 5.1 mEq/L   Chloride 100 96 - 112 mEq/L   CO2 30 19 - 32 mEq/L   Glucose, Bld 94 70 - 99 mg/dL   BUN 10 6 - 23 mg/dL   Creatinine, Ser 9.24 0.40 - 1.20 mg/dL   Total Bilirubin 1.1 0.2 - 1.2 mg/dL   Alkaline Phosphatase 74 39 - 117 U/L   AST 19 0 - 37 U/L   ALT 13 0 - 35 U/L   Total Protein 6.7 6.0 - 8.3 g/dL   Albumin 4.6 3.5 - 5.2 g/dL   GFR 22.03 >39.99 mL/min   Calcium 9.9 8.4 - 10.5 mg/dL  Lipid panel   Collection Time: 07/18/24  8:34 AM  Result Value Ref Range   Cholesterol 221 (H) 0 - 200 mg/dL   Triglycerides 31.9 0.0 - 149.0 mg/dL   HDL 06.69 >60.99 mg/dL   VLDL 86.3 0.0 - 59.9 mg/dL   LDL Cholesterol 884 (H) 0 - 99  mg/dL   Total CHOL/HDL Ratio 2    NonHDL 128.17   Hemoglobin A1c   Collection Time: 07/18/24  8:34 AM  Result Value Ref Range   Hgb A1c MFr Bld 6.0 4.6 - 6.5 %     COVID 19 screen:  No recent travel or known exposure to COVID19 The patient denies respiratory symptoms of COVID 19 at this time. The importance of social distancing was discussed today.   Assessment and Plan   The patient's preventative maintenance and recommended screening tests for an annual wellness exam were reviewed in full today. Brought up to date unless services declined.  Counselled on the importance of diet, exercise, and its role in overall health and mortality. The patient's FH and SH was reviewed, including their home life, tobacco status, and drug and alcohol status.   Mammogram  negative 09/2022 Colonoscopy 2010, Dr. Debrah,  2021 Cologuard neg, plan colonoscopy Last DEXA: 09/24/2022 On prolia , repeat in 2 years PAP/DVE: no pap indicated partial  hystec, Vaccines:uptodate with PNA,, completed Shingrix,  COVID x 4.,   Uptodate flu and COVID-vaccine boosters Nonsmoker Hep C: done   Problem List Items Addressed This Visit     High cholesterol   Chronic, increase in 10-year ASCVD risk score to 16%.  Statin indicated and discussed in detail with patient.  Will check lipoprotein a to risk stratify and consider checking CAC score. She will continue to work on low-cholesterol diet.  She will consider retesting cholesterol in 3 months.       Relevant Orders   Lipoprotein A (LPA)   Hypertension   Chronic, well-controlled on Maxide 25 mg / 37.5 mg p.o. daily.        Osteoporosis   chronic.  Continue current medication.   Prolia   60 mg every 6 months      Vitamin D  deficiency   Stable, chronic.  Continue current medication.        Other Visit Diagnoses       Routine general medical examination at a health care facility    -  Primary       No orders of the defined types were placed in this encounter.  Orders Placed This Encounter  Procedures   Lipoprotein A (LPA)       Greig Ring, MD

## 2024-07-25 NOTE — Patient Instructions (Signed)
 Please stop at the lab to have labs drawn.

## 2024-07-25 NOTE — Assessment & Plan Note (Signed)
 chronic.  Continue current medication.   Prolia   60 mg every 6 months

## 2024-07-25 NOTE — Assessment & Plan Note (Signed)
 Stable, chronic.  Continue current medication.

## 2024-07-25 NOTE — Assessment & Plan Note (Signed)
 Chronic, well-controlled on Maxide 25 mg / 37.5 mg p.o. daily.

## 2024-07-28 ENCOUNTER — Other Ambulatory Visit: Payer: Self-pay

## 2024-07-29 LAB — LIPOPROTEIN A (LPA): Lipoprotein (a): 117 nmol/L — ABNORMAL HIGH (ref <75)

## 2024-07-31 ENCOUNTER — Other Ambulatory Visit (HOSPITAL_COMMUNITY): Payer: Self-pay

## 2024-08-01 ENCOUNTER — Ambulatory Visit: Payer: Self-pay | Admitting: Family Medicine

## 2024-08-01 DIAGNOSIS — Z9189 Other specified personal risk factors, not elsewhere classified: Secondary | ICD-10-CM

## 2024-08-01 DIAGNOSIS — E7800 Pure hypercholesterolemia, unspecified: Secondary | ICD-10-CM

## 2024-08-07 ENCOUNTER — Other Ambulatory Visit: Payer: Self-pay

## 2024-08-07 NOTE — Progress Notes (Signed)
 Patient receiving injections through Buy and Bill. Dis-enrolling.

## 2024-08-10 ENCOUNTER — Other Ambulatory Visit: Payer: Self-pay | Admitting: Family Medicine

## 2024-08-10 DIAGNOSIS — Z9189 Other specified personal risk factors, not elsewhere classified: Secondary | ICD-10-CM

## 2024-08-10 DIAGNOSIS — E7800 Pure hypercholesterolemia, unspecified: Secondary | ICD-10-CM

## 2024-08-17 ENCOUNTER — Inpatient Hospital Stay (HOSPITAL_COMMUNITY): Admission: RE | Admit: 2024-08-17 | Discharge: 2024-08-17 | Payer: Self-pay | Attending: Internal Medicine

## 2024-08-17 DIAGNOSIS — Z9189 Other specified personal risk factors, not elsewhere classified: Secondary | ICD-10-CM

## 2024-08-17 DIAGNOSIS — E7800 Pure hypercholesterolemia, unspecified: Secondary | ICD-10-CM | POA: Insufficient documentation

## 2024-08-18 ENCOUNTER — Ambulatory Visit: Payer: Self-pay | Admitting: Family Medicine

## 2024-09-08 ENCOUNTER — Telehealth: Payer: Self-pay | Admitting: *Deleted

## 2024-09-08 NOTE — Telephone Encounter (Signed)
 Copied from CRM [id number]. Topic: Appointments - Scheduling Inquiry for Clinic >> Sep 08, 2024  3:18 PM Suzen RAMAN wrote: Reason for CRM: Patient would like a call back to schedule once her denosumab  (PROLIA ) injection 60 mg script is ordered/received at office.     CB# [phone]

## 2024-09-11 DIAGNOSIS — M810 Age-related osteoporosis without current pathological fracture: Secondary | ICD-10-CM

## 2024-09-11 NOTE — Telephone Encounter (Signed)
 See referral note

## 2024-09-12 ENCOUNTER — Other Ambulatory Visit (INDEPENDENT_AMBULATORY_CARE_PROVIDER_SITE_OTHER)

## 2024-09-12 ENCOUNTER — Ambulatory Visit: Payer: Self-pay | Admitting: Family Medicine

## 2024-09-12 DIAGNOSIS — E876 Hypokalemia: Secondary | ICD-10-CM

## 2024-09-12 DIAGNOSIS — M810 Age-related osteoporosis without current pathological fracture: Secondary | ICD-10-CM | POA: Diagnosis not present

## 2024-09-12 LAB — BASIC METABOLIC PANEL WITH GFR
BUN: 11 mg/dL (ref 6–23)
CO2: 26 meq/L (ref 19–32)
Calcium: 9.8 mg/dL (ref 8.4–10.5)
Chloride: 104 meq/L (ref 96–112)
Creatinine, Ser: 0.7 mg/dL (ref 0.40–1.20)
GFR: 84.6 mL/min
Glucose, Bld: 126 mg/dL — ABNORMAL HIGH (ref 70–99)
Potassium: 3.2 meq/L — ABNORMAL LOW (ref 3.5–5.1)
Sodium: 139 meq/L (ref 135–145)

## 2024-09-14 ENCOUNTER — Ambulatory Visit

## 2024-09-14 DIAGNOSIS — M810 Age-related osteoporosis without current pathological fracture: Secondary | ICD-10-CM

## 2024-09-14 DIAGNOSIS — E559 Vitamin D deficiency, unspecified: Secondary | ICD-10-CM

## 2024-09-14 MED ORDER — DENOSUMAB 60 MG/ML ~~LOC~~ SOSY: 60.0000 mg | PREFILLED_SYRINGE | Freq: Once | SUBCUTANEOUS | Status: AC

## 2024-09-14 MED ORDER — DENOSUMAB 60 MG/ML ~~LOC~~ SOSY
60.0000 mg | PREFILLED_SYRINGE | Freq: Once | SUBCUTANEOUS | Status: AC
  Administered 2024-09-14: 60 mg via SUBCUTANEOUS

## 2024-09-14 MED ORDER — POTASSIUM CHLORIDE CRYS ER 20 MEQ PO TBCR: 20.0000 meq | EXTENDED_RELEASE_TABLET | Freq: Every day | ORAL | 0 refills | Status: DC

## 2024-09-14 NOTE — Progress Notes (Signed)
 Per orders of Dr. Kerby Nora, injection of prolia 60 mg Van given by Lewanda Rife in right arm.. Patient tolerated injection well. Patient will make appointment for 6 month.

## 2024-09-21 ENCOUNTER — Telehealth: Payer: Self-pay | Admitting: *Deleted

## 2024-09-21 NOTE — Telephone Encounter (Signed)
 Vit D orders added as requested.

## 2024-09-21 NOTE — Telephone Encounter (Signed)
 Copied from CRM [id number]. Topic: Clinical - Request for Lab/Test Order >> Sep 21, 2024  2:21 PM Antony RAMAN wrote: Reason for CRM: wanting vitamin D  added onto labs

## 2024-09-21 NOTE — Addendum Note (Signed)
 Addended by: WENDELL ARLAND RAMAN on: 09/21/2024 02:29 PM   Modules accepted: Orders

## 2024-09-25 ENCOUNTER — Other Ambulatory Visit (INDEPENDENT_AMBULATORY_CARE_PROVIDER_SITE_OTHER)

## 2024-09-25 DIAGNOSIS — M810 Age-related osteoporosis without current pathological fracture: Secondary | ICD-10-CM | POA: Diagnosis not present

## 2024-09-25 DIAGNOSIS — E876 Hypokalemia: Secondary | ICD-10-CM | POA: Diagnosis not present

## 2024-09-25 DIAGNOSIS — E559 Vitamin D deficiency, unspecified: Secondary | ICD-10-CM | POA: Diagnosis not present

## 2024-09-25 LAB — BASIC METABOLIC PANEL WITH GFR
BUN: 8 mg/dL (ref 6–23)
CO2: 28 meq/L (ref 19–32)
Calcium: 9.3 mg/dL (ref 8.4–10.5)
Chloride: 102 meq/L (ref 96–112)
Creatinine, Ser: 0.68 mg/dL (ref 0.40–1.20)
GFR: 85.17 mL/min
Glucose, Bld: 100 mg/dL — ABNORMAL HIGH (ref 70–99)
Potassium: 3.3 meq/L — ABNORMAL LOW (ref 3.5–5.1)
Sodium: 137 meq/L (ref 135–145)

## 2024-09-25 LAB — VITAMIN D 25 HYDROXY (VIT D DEFICIENCY, FRACTURES): VITD: 49.27 ng/mL (ref 30.00–100.00)

## 2024-09-26 ENCOUNTER — Ambulatory Visit: Payer: Self-pay | Admitting: Family Medicine

## 2024-09-28 MED ORDER — AMLODIPINE BESYLATE 5 MG PO TABS: 5.0000 mg | ORAL_TABLET | Freq: Every day | ORAL | 11 refills | Status: AC

## 2024-09-28 MED ORDER — HYDROCHLOROTHIAZIDE 12.5 MG PO TABS: 12.5000 mg | ORAL_TABLET | Freq: Every day | ORAL | 3 refills | Status: AC | PRN

## 2024-09-29 NOTE — Addendum Note (Signed)
 Addended by: WENDELL ARLAND RAMAN on: 09/29/2024 08:29 AM   Modules accepted: Orders

## 2024-10-05 LAB — HM MAMMOGRAPHY

## 2024-10-08 ENCOUNTER — Other Ambulatory Visit: Payer: Self-pay | Admitting: Family Medicine

## 2024-10-09 ENCOUNTER — Encounter: Payer: Self-pay | Admitting: Family Medicine

## 2024-10-10 NOTE — Telephone Encounter (Signed)
 Pharmacy is requesting 90 day supply.  Is that appropriate for this medication?

## 2024-10-11 ENCOUNTER — Ambulatory Visit: Payer: Self-pay | Admitting: Family Medicine
